# Patient Record
Sex: Female | Born: 1950
Health system: Southern US, Community
[De-identification: ages and names within clinical notes are randomized; demographics above are authoritative.]

## PROBLEM LIST (undated history)

## (undated) DIAGNOSIS — T4145XA Adverse effect of unspecified anesthetic, initial encounter: Secondary | ICD-10-CM

## (undated) DIAGNOSIS — Z973 Presence of spectacles and contact lenses: Secondary | ICD-10-CM

## (undated) DIAGNOSIS — M1990 Unspecified osteoarthritis, unspecified site: Secondary | ICD-10-CM

## (undated) DIAGNOSIS — H3530 Unspecified macular degeneration: Secondary | ICD-10-CM

## (undated) DIAGNOSIS — T8859XA Other complications of anesthesia, initial encounter: Secondary | ICD-10-CM

## (undated) DIAGNOSIS — K219 Gastro-esophageal reflux disease without esophagitis: Secondary | ICD-10-CM

## (undated) DIAGNOSIS — E039 Hypothyroidism, unspecified: Secondary | ICD-10-CM

## (undated) DIAGNOSIS — M4316 Spondylolisthesis, lumbar region: Secondary | ICD-10-CM

## (undated) DIAGNOSIS — E781 Pure hyperglyceridemia: Secondary | ICD-10-CM

## (undated) DIAGNOSIS — E7800 Pure hypercholesterolemia, unspecified: Secondary | ICD-10-CM

## (undated) DIAGNOSIS — Z8709 Personal history of other diseases of the respiratory system: Secondary | ICD-10-CM

## (undated) DIAGNOSIS — H9190 Unspecified hearing loss, unspecified ear: Secondary | ICD-10-CM

## (undated) HISTORY — PX: BACK SURGERY: SHX140

## (undated) HISTORY — PX: KNEE ARTHROPLASTY: SHX992

## (undated) HISTORY — PX: CATARACT EXTRACTION W/ INTRAOCULAR LENS IMPLANT: SHX1309

## (undated) HISTORY — PX: TONSILLECTOMY: SUR1361

## (undated) HISTORY — PX: JOINT REPLACEMENT: SHX530

## (undated) HISTORY — PX: ABDOMINAL HYSTERECTOMY: SHX81

## (undated) HISTORY — PX: COLONOSCOPY: SHX174

## (undated) HISTORY — PX: APPENDECTOMY: SHX54

## (undated) HISTORY — PX: ESOPHAGOGASTRODUODENOSCOPY: SHX1529

---

## 2015-03-17 DIAGNOSIS — R7989 Other specified abnormal findings of blood chemistry: Secondary | ICD-10-CM | POA: Diagnosis not present

## 2015-03-17 DIAGNOSIS — E039 Hypothyroidism, unspecified: Secondary | ICD-10-CM | POA: Diagnosis not present

## 2015-03-17 DIAGNOSIS — E28310 Symptomatic premature menopause: Secondary | ICD-10-CM | POA: Diagnosis not present

## 2015-04-01 DIAGNOSIS — Z96651 Presence of right artificial knee joint: Secondary | ICD-10-CM | POA: Diagnosis not present

## 2015-04-01 DIAGNOSIS — M25562 Pain in left knee: Secondary | ICD-10-CM | POA: Diagnosis not present

## 2015-04-01 DIAGNOSIS — M25462 Effusion, left knee: Secondary | ICD-10-CM | POA: Diagnosis not present

## 2015-04-29 DIAGNOSIS — Z471 Aftercare following joint replacement surgery: Secondary | ICD-10-CM | POA: Diagnosis not present

## 2015-04-29 DIAGNOSIS — Z96652 Presence of left artificial knee joint: Secondary | ICD-10-CM | POA: Diagnosis not present

## 2015-05-05 DIAGNOSIS — E039 Hypothyroidism, unspecified: Secondary | ICD-10-CM | POA: Diagnosis not present

## 2015-05-05 DIAGNOSIS — E782 Mixed hyperlipidemia: Secondary | ICD-10-CM | POA: Diagnosis not present

## 2015-05-05 DIAGNOSIS — K219 Gastro-esophageal reflux disease without esophagitis: Secondary | ICD-10-CM | POA: Diagnosis not present

## 2015-05-05 DIAGNOSIS — Z79899 Other long term (current) drug therapy: Secondary | ICD-10-CM | POA: Diagnosis not present

## 2015-05-05 DIAGNOSIS — Z01818 Encounter for other preprocedural examination: Secondary | ICD-10-CM | POA: Diagnosis not present

## 2015-05-18 ENCOUNTER — Other Ambulatory Visit: Payer: Self-pay | Admitting: Orthopedic Surgery

## 2015-05-27 DIAGNOSIS — K222 Esophageal obstruction: Secondary | ICD-10-CM | POA: Diagnosis not present

## 2015-05-27 DIAGNOSIS — K200 Eosinophilic esophagitis: Secondary | ICD-10-CM | POA: Diagnosis not present

## 2015-05-27 DIAGNOSIS — K219 Gastro-esophageal reflux disease without esophagitis: Secondary | ICD-10-CM | POA: Diagnosis not present

## 2015-05-28 DIAGNOSIS — H5203 Hypermetropia, bilateral: Secondary | ICD-10-CM | POA: Diagnosis not present

## 2015-05-28 DIAGNOSIS — H353131 Nonexudative age-related macular degeneration, bilateral, early dry stage: Secondary | ICD-10-CM | POA: Diagnosis not present

## 2015-06-02 NOTE — Pre-Procedure Instructions (Signed)
    Montgomery Bartoo  06/02/2015      [facility] - [place], [place] - [place] [address] Phone: [phone] Fax: [phone]    Your procedure is scheduled on Monday, April 17  Report to [facility] Admitting at 7:50 A.M.  Call this number if you have problems the morning of surgery:  [phone]              Any questions prior to surgery call [phone] Monday-Friday 8am-4pm   Remember:  Do not eat food or drink liquids after midnight on Sunday, April 16    Take these medicines the morning of surgery with A SIP OF WATER: levothyroxine(synthroid), omeprazole(prilosec), liothyronine (cytomel)  7 days prior to surgery, stop taking: Aspirin, NSAIDS, Aleve, Naproxen, Ibuprofen, Advil, Motrin, BC's, Goody's, Fish oil, all herbal medications, and all vitamins.    Do not wear jewelry, make-up or nail polish.  Do not wear lotions, powders, or perfumes.    Do not shave 48 hours prior to surgery.    Do not bring valuables to the hospital.   [facility] is not responsible for any belongings or valuables.  Contacts, dentures or bridgework may not be worn into surgery.  Leave your suitcase in the car.  After surgery it may be brought to your room.  For patients admitted to the hospital, discharge time will be determined by your treatment team.  Patients discharged the day of surgery will not be allowed to drive home.   Name and phone number of your driver:  Special instructions: review handout for preparing for surgery  Please read over the following fact sheets that you were given. Pain Booklet, Coughing and Deep Breathing, Blood Transfusion Information, MRSA Information and Surgical Site Infection Prevention

## 2015-06-03 ENCOUNTER — Ambulatory Visit (HOSPITAL_COMMUNITY)
Admission: RE | Admit: 2015-06-03 | Discharge: 2015-06-03 | Disposition: A | Discharge status: Home / Self Care | Payer: PPO | Source: Ambulatory Visit | Attending: Orthopedic Surgery | Admitting: Orthopedic Surgery

## 2015-06-03 ENCOUNTER — Encounter (HOSPITAL_COMMUNITY)
Admission: RE | Admit: 2015-06-03 | Discharge: 2015-06-03 | Disposition: A | Discharge status: Home / Self Care | Payer: PPO | Source: Ambulatory Visit | Attending: Orthopedic Surgery | Admitting: Orthopedic Surgery

## 2015-06-03 ENCOUNTER — Encounter (HOSPITAL_COMMUNITY): Payer: Self-pay

## 2015-06-03 DIAGNOSIS — Z01818 Encounter for other preprocedural examination: Secondary | ICD-10-CM

## 2015-06-03 DIAGNOSIS — Z01812 Encounter for preprocedural laboratory examination: Secondary | ICD-10-CM | POA: Insufficient documentation

## 2015-06-03 DIAGNOSIS — Z0183 Encounter for blood typing: Secondary | ICD-10-CM | POA: Insufficient documentation

## 2015-06-03 DIAGNOSIS — I4510 Unspecified right bundle-branch block: Secondary | ICD-10-CM | POA: Diagnosis not present

## 2015-06-03 HISTORY — DX: Adverse effect of unspecified anesthetic, initial encounter: T41.45XA

## 2015-06-03 HISTORY — DX: Personal history of other diseases of the respiratory system: Z87.09

## 2015-06-03 HISTORY — DX: Unspecified hearing loss, unspecified ear: H91.90

## 2015-06-03 HISTORY — DX: Pure hyperglyceridemia: E78.1

## 2015-06-03 HISTORY — DX: Other complications of anesthesia, initial encounter: T88.59XA

## 2015-06-03 HISTORY — DX: Pure hypercholesterolemia, unspecified: E78.00

## 2015-06-03 HISTORY — DX: Gastro-esophageal reflux disease without esophagitis: K21.9

## 2015-06-03 HISTORY — DX: Unspecified osteoarthritis, unspecified site: M19.90

## 2015-06-03 HISTORY — DX: Unspecified macular degeneration: H35.30

## 2015-06-03 HISTORY — DX: Hypothyroidism, unspecified: E03.9

## 2015-06-03 LAB — CBC WITH DIFFERENTIAL/PLATELET
Basophils Absolute: 0.1 10*3/uL (ref 0.0–0.1)
Basophils Relative: 1 %
EOS PCT: 1 %
Eosinophils Absolute: 0.1 10*3/uL (ref 0.0–0.7)
HEMATOCRIT: 47.7 % — AB (ref 36.0–46.0)
Hemoglobin: 17.2 g/dL — ABNORMAL HIGH (ref 12.0–15.0)
LYMPHS ABS: 2.5 10*3/uL (ref 0.7–4.0)
LYMPHS PCT: 24 %
MCH: 31.4 pg (ref 26.0–34.0)
MCHC: 36.1 g/dL — ABNORMAL HIGH (ref 30.0–36.0)
MCV: 87 fL (ref 78.0–100.0)
MONO ABS: 0.6 10*3/uL (ref 0.1–1.0)
Monocytes Relative: 6 %
NEUTROS ABS: 7.3 10*3/uL (ref 1.7–7.7)
Neutrophils Relative %: 68 %
PLATELETS: 224 10*3/uL (ref 150–400)
RBC: 5.48 MIL/uL — AB (ref 3.87–5.11)
RDW: 12.5 % (ref 11.5–15.5)
WBC: 10.6 10*3/uL — ABNORMAL HIGH (ref 4.0–10.5)

## 2015-06-03 LAB — COMPREHENSIVE METABOLIC PANEL
ALT: 15 U/L (ref 14–54)
AST: 20 U/L (ref 15–41)
Albumin: 3.8 g/dL (ref 3.5–5.0)
Alkaline Phosphatase: 48 U/L (ref 38–126)
Anion gap: 11 (ref 5–15)
BILIRUBIN TOTAL: 0.8 mg/dL (ref 0.3–1.2)
BUN: 16 mg/dL (ref 6–20)
CHLORIDE: 108 mmol/L (ref 101–111)
CO2: 22 mmol/L (ref 22–32)
CREATININE: 0.67 mg/dL (ref 0.44–1.00)
Calcium: 9.4 mg/dL (ref 8.9–10.3)
GFR calc non Af Amer: >60 mL/min (ref >60)
Glucose, Bld: 83 mg/dL (ref 65–99)
Potassium: 3.5 mmol/L (ref 3.5–5.1)
Sodium: 141 mmol/L (ref 135–145)
TOTAL PROTEIN: 7.2 g/dL (ref 6.5–8.1)

## 2015-06-03 LAB — URINALYSIS, ROUTINE W REFLEX MICROSCOPIC
Bilirubin Urine: NEGATIVE
GLUCOSE, UA: NEGATIVE mg/dL
Hgb urine dipstick: NEGATIVE
Ketones, ur: NEGATIVE mg/dL
LEUKOCYTES UA: NEGATIVE
NITRITE: NEGATIVE
PH: 5.5 (ref 5.0–8.0)
Protein, ur: NEGATIVE mg/dL
Specific Gravity, Urine: 1.009 (ref 1.005–1.030)

## 2015-06-03 LAB — TYPE AND SCREEN
ABO/RH(D): A POS
Antibody Screen: NEGATIVE

## 2015-06-03 LAB — SURGICAL PCR SCREEN
MRSA, PCR: NEGATIVE
STAPHYLOCOCCUS AUREUS: NEGATIVE

## 2015-06-03 LAB — PROTIME-INR
INR: 1.03 (ref 0.00–1.49)
PROTHROMBIN TIME: 13.7 s (ref 11.6–15.2)

## 2015-06-03 LAB — ABO/RH: ABO/RH(D): A POS

## 2015-06-03 LAB — APTT: aPTT: 31 seconds (ref 24–37)

## 2015-06-03 NOTE — Progress Notes (Signed)
PCP - Dr. Gilford Rile Cardiologist - denies  EKG - 06/03/15 - also requested previous EKG from [place] - 06/03/15 Echo/Stress Test - requested from [place] denies  Patient denies chest pain and shortness of breath at PAT appointment.

## 2015-06-04 LAB — URINE CULTURE: Culture: 1000 — AB

## 2015-06-04 NOTE — Progress Notes (Signed)
Anesthesia Chart Review:  Pt is a 65 year old female scheduled for L total knee revision on 06/14/2015 with Dr. Ronnie Derby.   PMH includes:  Hyperlipidemia, hypothyroidism, GERD. Hard of hearing. Former smoker. BMI 24  Medications include: ASA, levothyroxine, liothyronine, prilosec  Preoperative labs reviewed.    Chest x-ray 06/03/15 reviewed. No edema or consolidation   EKG 06/03/15: NSR. RBBB  Nuclear stress test 07/03/14:  1. No reversible ischemia or infarction 2. Normal LV wall motion 3. EF 58% 4. Low risk stress test findings  CTA 07/03/14:  LM and LAD CAD  Stress echo 11/23/13: No evidence of inducible ischemia to achieved workload  If no changes, I anticipate pt can proceed with surgery as scheduled.   Willeen Cass, FNP-BC [facility]/Anesthesiology Phone: [phone] 06/04/2015 12:31 PM

## 2015-06-11 MED ORDER — BUPIVACAINE LIPOSOME 1.3 % IJ SUSP
20.0000 mL | INTRAMUSCULAR | Status: AC
  Administered 2015-06-14: 20 mL
  Filled 2015-06-11: qty 20

## 2015-06-11 MED ORDER — SODIUM CHLORIDE 0.9 % IV SOLN: INTRAVENOUS | Status: DC

## 2015-06-11 MED ORDER — TRANEXAMIC ACID 1000 MG/10ML IV SOLN
1000.0000 mg | INTRAVENOUS | Status: AC
  Administered 2015-06-14: 1000 mg via INTRAVENOUS
  Filled 2015-06-11: qty 10

## 2015-06-11 MED ORDER — ACETAMINOPHEN 500 MG PO TABS
1000.0000 mg | ORAL_TABLET | Freq: Once | ORAL | Status: AC
  Administered 2015-06-14: 1000 mg via ORAL
  Filled 2015-06-11: qty 2

## 2015-06-14 ENCOUNTER — Inpatient Hospital Stay (HOSPITAL_COMMUNITY): Payer: PPO | Admitting: Emergency Medicine

## 2015-06-14 ENCOUNTER — Inpatient Hospital Stay (HOSPITAL_COMMUNITY): Payer: PPO | Admitting: Certified Registered Nurse Anesthetist

## 2015-06-14 ENCOUNTER — Inpatient Hospital Stay (HOSPITAL_COMMUNITY)
Admission: RE | Admit: 2015-06-14 | Discharge: 2015-06-15 | DRG: 489 | Disposition: A | Discharge status: Home Health | Payer: PPO | Source: Ambulatory Visit | Attending: Orthopedic Surgery | Admitting: Orthopedic Surgery

## 2015-06-14 ENCOUNTER — Encounter (HOSPITAL_COMMUNITY)
Admission: RE | Disposition: A | Discharge status: Home Health | Payer: Self-pay | Source: Ambulatory Visit | Attending: Orthopedic Surgery

## 2015-06-14 ENCOUNTER — Encounter (HOSPITAL_COMMUNITY): Payer: Self-pay | Admitting: *Deleted

## 2015-06-14 DIAGNOSIS — E039 Hypothyroidism, unspecified: Secondary | ICD-10-CM | POA: Diagnosis not present

## 2015-06-14 DIAGNOSIS — E781 Pure hyperglyceridemia: Secondary | ICD-10-CM | POA: Diagnosis not present

## 2015-06-14 DIAGNOSIS — Z96659 Presence of unspecified artificial knee joint: Secondary | ICD-10-CM

## 2015-06-14 DIAGNOSIS — T84093A Other mechanical complication of internal left knee prosthesis, initial encounter: Secondary | ICD-10-CM | POA: Diagnosis not present

## 2015-06-14 DIAGNOSIS — T84023A Instability of internal left knee prosthesis, initial encounter: Secondary | ICD-10-CM | POA: Diagnosis not present

## 2015-06-14 DIAGNOSIS — Y792 Prosthetic and other implants, materials and accessory orthopedic devices associated with adverse incidents: Secondary | ICD-10-CM | POA: Diagnosis present

## 2015-06-14 DIAGNOSIS — Z96652 Presence of left artificial knee joint: Secondary | ICD-10-CM | POA: Diagnosis not present

## 2015-06-14 DIAGNOSIS — K219 Gastro-esophageal reflux disease without esophagitis: Secondary | ICD-10-CM | POA: Diagnosis present

## 2015-06-14 DIAGNOSIS — E7800 Pure hypercholesterolemia, unspecified: Secondary | ICD-10-CM | POA: Diagnosis not present

## 2015-06-14 DIAGNOSIS — T8484XA Pain due to internal orthopedic prosthetic devices, implants and grafts, initial encounter: Secondary | ICD-10-CM | POA: Diagnosis not present

## 2015-06-14 DIAGNOSIS — H9190 Unspecified hearing loss, unspecified ear: Secondary | ICD-10-CM | POA: Diagnosis present

## 2015-06-14 DIAGNOSIS — Z87891 Personal history of nicotine dependence: Secondary | ICD-10-CM | POA: Diagnosis not present

## 2015-06-14 DIAGNOSIS — G8918 Other acute postprocedural pain: Secondary | ICD-10-CM | POA: Diagnosis not present

## 2015-06-14 HISTORY — PX: TOTAL KNEE REVISION: SHX996

## 2015-06-14 LAB — CBC
HCT: 42.6 % (ref 36.0–46.0)
Hemoglobin: 14.4 g/dL (ref 12.0–15.0)
MCH: 29.4 pg (ref 26.0–34.0)
MCHC: 33.8 g/dL (ref 30.0–36.0)
MCV: 86.9 fL (ref 78.0–100.0)
PLATELETS: 188 10*3/uL (ref 150–400)
RBC: 4.9 MIL/uL (ref 3.87–5.11)
RDW: 12.5 % (ref 11.5–15.5)
WBC: 10.7 10*3/uL — AB (ref 4.0–10.5)

## 2015-06-14 LAB — CREATININE, SERUM: CREATININE: 0.65 mg/dL (ref 0.44–1.00)

## 2015-06-14 SURGERY — TOTAL KNEE REVISION
Anesthesia: Spinal | Site: Knee | Laterality: Left

## 2015-06-14 MED ORDER — ENOXAPARIN SODIUM 30 MG/0.3ML ~~LOC~~ SOLN
30.0000 mg | Freq: Two times a day (BID) | SUBCUTANEOUS | Status: DC
  Administered 2015-06-15: 30 mg via SUBCUTANEOUS
  Filled 2015-06-14: qty 0.3

## 2015-06-14 MED ORDER — PROPOFOL 10 MG/ML IV BOLUS
INTRAVENOUS | Status: DC | PRN
  Administered 2015-06-14: 20 mg via INTRAVENOUS

## 2015-06-14 MED ORDER — ACETAMINOPHEN 325 MG PO TABS: 650.0000 mg | ORAL_TABLET | Freq: Four times a day (QID) | ORAL | Status: DC | PRN

## 2015-06-14 MED ORDER — PANTOPRAZOLE SODIUM 40 MG PO TBEC
40.0000 mg | DELAYED_RELEASE_TABLET | Freq: Every day | ORAL | Status: DC
  Administered 2015-06-15: 40 mg via ORAL
  Filled 2015-06-14: qty 1

## 2015-06-14 MED ORDER — MIDAZOLAM HCL 2 MG/2ML IJ SOLN
INTRAMUSCULAR | Status: AC
  Administered 2015-06-14: 1 mg via INTRAVENOUS
  Administered 2015-06-14: 2 mg via INTRAVENOUS
  Administered 2015-06-14: 1 mg via INTRAVENOUS
  Filled 2015-06-14: qty 2

## 2015-06-14 MED ORDER — MENTHOL 3 MG MT LOZG: 1.0000 | LOZENGE | OROMUCOSAL | Status: DC | PRN

## 2015-06-14 MED ORDER — FENTANYL CITRATE (PF) 100 MCG/2ML IJ SOLN
INTRAMUSCULAR | Status: AC
  Administered 2015-06-14 (×2): 50 ug via INTRAVENOUS
  Filled 2015-06-14: qty 2

## 2015-06-14 MED ORDER — ONDANSETRON HCL 4 MG/2ML IJ SOLN: 4.0000 mg | Freq: Once | INTRAMUSCULAR | Status: DC | PRN

## 2015-06-14 MED ORDER — SODIUM CHLORIDE 0.9 % IV SOLN: INTRAVENOUS | Status: DC

## 2015-06-14 MED ORDER — BUPIVACAINE IN DEXTROSE 0.75-8.25 % IT SOLN
INTRATHECAL | Status: DC | PRN
  Administered 2015-06-14: 2 mL via INTRATHECAL

## 2015-06-14 MED ORDER — ACETAMINOPHEN 650 MG RE SUPP: 650.0000 mg | Freq: Four times a day (QID) | RECTAL | Status: DC | PRN

## 2015-06-14 MED ORDER — ZOLPIDEM TARTRATE 5 MG PO TABS: 5.0000 mg | ORAL_TABLET | Freq: Every evening | ORAL | Status: DC | PRN

## 2015-06-14 MED ORDER — BISACODYL 5 MG PO TBEC: 5.0000 mg | DELAYED_RELEASE_TABLET | Freq: Every day | ORAL | Status: DC | PRN

## 2015-06-14 MED ORDER — ONDANSETRON HCL 4 MG PO TABS
4.0000 mg | ORAL_TABLET | Freq: Four times a day (QID) | ORAL | Status: DC | PRN
  Administered 2015-06-15: 4 mg via ORAL
  Filled 2015-06-14: qty 1

## 2015-06-14 MED ORDER — CHLORHEXIDINE GLUCONATE 4 % EX LIQD: 60.0000 mL | Freq: Once | CUTANEOUS | Status: DC

## 2015-06-14 MED ORDER — BUPIVACAINE-EPINEPHRINE (PF) 0.25% -1:200000 IJ SOLN
INTRAMUSCULAR | Status: AC
  Filled 2015-06-14: qty 30

## 2015-06-14 MED ORDER — CLINDAMYCIN PHOSPHATE 600 MG/50ML IV SOLN
600.0000 mg | Freq: Four times a day (QID) | INTRAVENOUS | Status: AC
  Administered 2015-06-14 (×2): 600 mg via INTRAVENOUS
  Filled 2015-06-14 (×2): qty 50

## 2015-06-14 MED ORDER — METHOCARBAMOL 500 MG PO TABS
ORAL_TABLET | ORAL | Status: AC
  Filled 2015-06-14: qty 1

## 2015-06-14 MED ORDER — DEXTROSE 5 % IV SOLN
2.0000 g | INTRAVENOUS | Status: DC
  Filled 2015-06-14 (×2): qty 20

## 2015-06-14 MED ORDER — FENTANYL CITRATE (PF) 250 MCG/5ML IJ SOLN
INTRAMUSCULAR | Status: AC
  Filled 2015-06-14: qty 5

## 2015-06-14 MED ORDER — OXYCODONE HCL 5 MG PO TABS
5.0000 mg | ORAL_TABLET | ORAL | Status: DC | PRN
  Administered 2015-06-14: 10 mg via ORAL

## 2015-06-14 MED ORDER — BUPIVACAINE-EPINEPHRINE 0.25% -1:200000 IJ SOLN
INTRAMUSCULAR | Status: DC | PRN
  Administered 2015-06-14: 30 mg

## 2015-06-14 MED ORDER — MEPERIDINE HCL 25 MG/ML IJ SOLN: 6.2500 mg | INTRAMUSCULAR | Status: DC | PRN

## 2015-06-14 MED ORDER — PHENYLEPHRINE 40 MCG/ML (10ML) SYRINGE FOR IV PUSH (FOR BLOOD PRESSURE SUPPORT)
PREFILLED_SYRINGE | INTRAVENOUS | Status: AC
  Filled 2015-06-14: qty 10

## 2015-06-14 MED ORDER — METHOCARBAMOL 500 MG PO TABS
500.0000 mg | ORAL_TABLET | Freq: Four times a day (QID) | ORAL | Status: DC | PRN
  Administered 2015-06-14: 500 mg via ORAL

## 2015-06-14 MED ORDER — METOCLOPRAMIDE HCL 5 MG PO TABS: 5.0000 mg | ORAL_TABLET | Freq: Three times a day (TID) | ORAL | Status: DC | PRN

## 2015-06-14 MED ORDER — LIDOCAINE HCL (CARDIAC) 20 MG/ML IV SOLN
INTRAVENOUS | Status: DC | PRN
  Administered 2015-06-14: 60 mg via INTRAVENOUS

## 2015-06-14 MED ORDER — ONDANSETRON HCL 4 MG/2ML IJ SOLN: 4.0000 mg | Freq: Four times a day (QID) | INTRAMUSCULAR | Status: DC | PRN

## 2015-06-14 MED ORDER — HYDROMORPHONE HCL 1 MG/ML IJ SOLN: 0.2500 mg | INTRAMUSCULAR | Status: DC | PRN

## 2015-06-14 MED ORDER — ALUM & MAG HYDROXIDE-SIMETH 200-200-20 MG/5ML PO SUSP: 30.0000 mL | ORAL | Status: DC | PRN

## 2015-06-14 MED ORDER — OXYCODONE HCL 5 MG PO TABS
ORAL_TABLET | ORAL | Status: AC
  Filled 2015-06-14: qty 2

## 2015-06-14 MED ORDER — FLEET ENEMA 7-19 GM/118ML RE ENEM: 1.0000 | ENEMA | Freq: Once | RECTAL | Status: DC | PRN

## 2015-06-14 MED ORDER — METHOCARBAMOL 1000 MG/10ML IJ SOLN
500.0000 mg | Freq: Four times a day (QID) | INTRAVENOUS | Status: DC | PRN
  Filled 2015-06-14: qty 5

## 2015-06-14 MED ORDER — LIDOCAINE HCL (CARDIAC) 20 MG/ML IV SOLN
INTRAVENOUS | Status: AC
  Filled 2015-06-14: qty 5

## 2015-06-14 MED ORDER — DIPHENHYDRAMINE HCL 12.5 MG/5ML PO ELIX: 12.5000 mg | ORAL_SOLUTION | ORAL | Status: DC | PRN

## 2015-06-14 MED ORDER — BUPIVACAINE-EPINEPHRINE (PF) 0.5% -1:200000 IJ SOLN
INTRAMUSCULAR | Status: DC | PRN
  Administered 2015-06-14: 30 mL via PERINEURAL

## 2015-06-14 MED ORDER — PHENOL 1.4 % MT LIQD: 1.0000 | OROMUCOSAL | Status: DC | PRN

## 2015-06-14 MED ORDER — CEFAZOLIN SODIUM-DEXTROSE 2-4 GM/100ML-% IV SOLN
INTRAVENOUS | Status: AC
  Administered 2015-06-14: 2 g via INTRAVENOUS
  Filled 2015-06-14: qty 100

## 2015-06-14 MED ORDER — PHENYLEPHRINE 40 MCG/ML (10ML) SYRINGE FOR IV PUSH (FOR BLOOD PRESSURE SUPPORT)
PREFILLED_SYRINGE | INTRAVENOUS | Status: AC
Start: 2015-06-14 — End: 2015-06-14
  Filled 2015-06-14: qty 20

## 2015-06-14 MED ORDER — LIOTHYRONINE SODIUM 25 MCG PO TABS
25.0000 ug | ORAL_TABLET | Freq: Every day | ORAL | Status: DC
  Administered 2015-06-15: 25 ug via ORAL
  Filled 2015-06-14: qty 1

## 2015-06-14 MED ORDER — CELECOXIB 200 MG PO CAPS
200.0000 mg | ORAL_CAPSULE | Freq: Two times a day (BID) | ORAL | Status: DC
  Administered 2015-06-14 – 2015-06-15 (×3): 200 mg via ORAL
  Filled 2015-06-14 (×3): qty 1

## 2015-06-14 MED ORDER — MIDAZOLAM HCL 2 MG/2ML IJ SOLN
INTRAMUSCULAR | Status: AC
  Filled 2015-06-14: qty 2

## 2015-06-14 MED ORDER — PROPOFOL 500 MG/50ML IV EMUL
INTRAVENOUS | Status: DC | PRN
  Administered 2015-06-14: 50 ug/kg/min via INTRAVENOUS

## 2015-06-14 MED ORDER — BUPIVACAINE-EPINEPHRINE 0.5% -1:200000 IJ SOLN: INTRAMUSCULAR | Status: DC | PRN

## 2015-06-14 MED ORDER — HYDROMORPHONE HCL 1 MG/ML IJ SOLN
0.5000 mg | INTRAMUSCULAR | Status: DC | PRN
  Administered 2015-06-14: 0.5 mg via INTRAVENOUS
  Filled 2015-06-14: qty 1

## 2015-06-14 MED ORDER — TOBRAMYCIN SULFATE 1.2 G IJ SOLR
INTRAMUSCULAR | Status: AC
  Filled 2015-06-14: qty 1.2

## 2015-06-14 MED ORDER — OXYCODONE HCL ER 10 MG PO T12A
10.0000 mg | EXTENDED_RELEASE_TABLET | Freq: Two times a day (BID) | ORAL | Status: DC
  Administered 2015-06-14 – 2015-06-15 (×3): 10 mg via ORAL
  Filled 2015-06-14 (×3): qty 1

## 2015-06-14 MED ORDER — TRANEXAMIC ACID 1000 MG/10ML IV SOLN
1000.0000 mg | Freq: Once | INTRAVENOUS | Status: AC
  Administered 2015-06-14: 1000 mg via INTRAVENOUS
  Filled 2015-06-14: qty 10

## 2015-06-14 MED ORDER — LACTATED RINGERS IV SOLN
INTRAVENOUS | Status: DC
  Administered 2015-06-14 (×4): via INTRAVENOUS

## 2015-06-14 MED ORDER — DOCUSATE SODIUM 100 MG PO CAPS
100.0000 mg | ORAL_CAPSULE | Freq: Two times a day (BID) | ORAL | Status: DC
  Administered 2015-06-14 – 2015-06-15 (×2): 100 mg via ORAL
  Filled 2015-06-14 (×2): qty 1

## 2015-06-14 MED ORDER — SODIUM CHLORIDE 0.9 % IR SOLN
Status: DC | PRN
  Administered 2015-06-14: 1000 mL

## 2015-06-14 MED ORDER — PHENYLEPHRINE HCL 10 MG/ML IJ SOLN
INTRAMUSCULAR | Status: DC | PRN
  Administered 2015-06-14: 40 ug via INTRAVENOUS
  Administered 2015-06-14 (×2): 80 ug via INTRAVENOUS
  Administered 2015-06-14: 40 ug via INTRAVENOUS
  Administered 2015-06-14 (×2): 80 ug via INTRAVENOUS

## 2015-06-14 MED ORDER — LEVOTHYROXINE SODIUM 88 MCG PO TABS
88.0000 ug | ORAL_TABLET | Freq: Every day | ORAL | Status: DC
  Administered 2015-06-15: 88 ug via ORAL
  Filled 2015-06-14: qty 1

## 2015-06-14 MED ORDER — METOCLOPRAMIDE HCL 5 MG/ML IJ SOLN: 5.0000 mg | Freq: Three times a day (TID) | INTRAMUSCULAR | Status: DC | PRN

## 2015-06-14 MED ORDER — SODIUM CHLORIDE 0.9 % IJ SOLN
INTRAMUSCULAR | Status: DC | PRN
  Administered 2015-06-14: 20 mL via INTRAVENOUS

## 2015-06-14 MED ORDER — CEFAZOLIN SODIUM 1 G IJ SOLR
2.0000 g | INTRAMUSCULAR | Status: DC
  Filled 2015-06-14: qty 20

## 2015-06-14 MED ORDER — SENNOSIDES-DOCUSATE SODIUM 8.6-50 MG PO TABS: 1.0000 | ORAL_TABLET | Freq: Every evening | ORAL | Status: DC | PRN

## 2015-06-14 SURGICAL SUPPLY — 72 items
BANDAGE ESMARK 6X9 LF (GAUZE/BANDAGES/DRESSINGS) ×1 IMPLANT
BLADE SAGITTAL 13X1.27X60 (BLADE) IMPLANT
BLADE SAGITTAL 13X1.27X60MM (BLADE)
BLADE SAW SGTL 13X75X1.27 (BLADE) ×3 IMPLANT
BLADE SAW SGTL 83.5X18.5 (BLADE) ×3 IMPLANT
BLADE SAW SGTL NAR THIN XSHT (BLADE) IMPLANT
BLADE SURG 10 STRL SS (BLADE) ×9 IMPLANT
BNDG ESMARK 6X9 LF (GAUZE/BANDAGES/DRESSINGS) ×3
BOWL SMART MIX CTS (DISPOSABLE) IMPLANT
COVER SURGICAL LIGHT HANDLE (MISCELLANEOUS) ×3 IMPLANT
CUFF TOURNIQUET SINGLE 34IN LL (TOURNIQUET CUFF) ×3 IMPLANT
DRAPE EXTREMITY T 121X128X90 (DRAPE) ×3 IMPLANT
DRAPE IMP U-DRAPE 54X76 (DRAPES) ×3 IMPLANT
DRAPE INCISE IOBAN 66X45 STRL (DRAPES) ×6 IMPLANT
DRAPE PROXIMA HALF (DRAPES) ×3 IMPLANT
DRAPE U-SHAPE 47X51 STRL (DRAPES) ×3 IMPLANT
DRSG ADAPTIC 3X8 NADH LF (GAUZE/BANDAGES/DRESSINGS) ×3 IMPLANT
DRSG PAD ABDOMINAL 8X10 ST (GAUZE/BANDAGES/DRESSINGS) ×3 IMPLANT
DURAPREP 26ML APPLICATOR (WOUND CARE) ×6 IMPLANT
ELECT REM PT RETURN 9FT ADLT (ELECTROSURGICAL) ×3
ELECTRODE REM PT RTRN 9FT ADLT (ELECTROSURGICAL) ×1 IMPLANT
EVACUATOR 1/8 PVC DRAIN (DRAIN) ×3 IMPLANT
FACESHIELD WRAPAROUND (MASK) ×3 IMPLANT
GAUZE SPONGE 4X4 12PLY STRL (GAUZE/BANDAGES/DRESSINGS) ×3 IMPLANT
GLOVE BIOGEL M 7.0 STRL (GLOVE) ×3 IMPLANT
GLOVE BIOGEL PI IND STRL 7.5 (GLOVE) IMPLANT
GLOVE BIOGEL PI IND STRL 8.5 (GLOVE) ×4 IMPLANT
GLOVE BIOGEL PI INDICATOR 7.5 (GLOVE)
GLOVE BIOGEL PI INDICATOR 8.5 (GLOVE) ×8
GLOVE BIOGEL PI ORTHO PRO SZ8 (GLOVE) ×2
GLOVE PI ORTHO PRO STRL SZ8 (GLOVE) ×1 IMPLANT
GLOVE SURG ORTHO 8.0 STRL STRW (GLOVE) ×18 IMPLANT
GOWN STRL REUS W/ TWL LRG LVL3 (GOWN DISPOSABLE) ×2 IMPLANT
GOWN STRL REUS W/ TWL XL LVL3 (GOWN DISPOSABLE) ×2 IMPLANT
GOWN STRL REUS W/TWL 2XL LVL3 (GOWN DISPOSABLE) ×3 IMPLANT
GOWN STRL REUS W/TWL LRG LVL3 (GOWN DISPOSABLE) ×4
GOWN STRL REUS W/TWL XL LVL3 (GOWN DISPOSABLE) ×4
HANDPIECE INTERPULSE COAX TIP (DISPOSABLE)
HOOD PEEL AWAY FACE SHEILD DIS (HOOD) ×12 IMPLANT
INSERT TIB 3 11XCNDRL STAB BRN (Insert) ×1 IMPLANT
KIT BASIN OR (CUSTOM PROCEDURE TRAY) ×3 IMPLANT
KIT ROOM TURNOVER OR (KITS) ×3 IMPLANT
KNEE INSERT TIB BRG (Insert) ×2 IMPLANT
MANIFOLD NEPTUNE II (INSTRUMENTS) ×3 IMPLANT
NEEDLE 18GX1X1/2 (RX/OR ONLY) (NEEDLE) IMPLANT
NEEDLE 22X1 1/2 (OR ONLY) (NEEDLE) ×6 IMPLANT
NS IRRIG 1000ML POUR BTL (IV SOLUTION) ×3 IMPLANT
PACK TOTAL JOINT (CUSTOM PROCEDURE TRAY) ×3 IMPLANT
PACK UNIVERSAL I (CUSTOM PROCEDURE TRAY) IMPLANT
PAD ARMBOARD 7.5X6 YLW CONV (MISCELLANEOUS) ×6 IMPLANT
PADDING CAST COTTON 6X4 STRL (CAST SUPPLIES) ×3 IMPLANT
SET HNDPC FAN SPRY TIP SCT (DISPOSABLE) IMPLANT
STAPLER VISISTAT 35W (STAPLE) ×3 IMPLANT
SUCTION FRAZIER HANDLE 10FR (MISCELLANEOUS) ×2
SUCTION TUBE FRAZIER 10FR DISP (MISCELLANEOUS) ×1 IMPLANT
SUT BONE WAX W31G (SUTURE) ×3 IMPLANT
SUT PDS AB 0 CT 36 (SUTURE) IMPLANT
SUT PDS AB 1 CT  36 (SUTURE)
SUT PDS AB 1 CT 36 (SUTURE) IMPLANT
SUT PDS AB 2-0 CT1 27 (SUTURE) IMPLANT
SUT VIC AB 0 CTB1 27 (SUTURE) ×3 IMPLANT
SUT VIC AB 1 CT1 27 (SUTURE) ×2
SUT VIC AB 1 CT1 27XBRD ANBCTR (SUTURE) ×1 IMPLANT
SUT VIC AB 2-0 CTB1 (SUTURE) ×6 IMPLANT
SWAB COLLECTION DEVICE MRSA (MISCELLANEOUS) IMPLANT
SYR 20CC LL (SYRINGE) ×6 IMPLANT
SYR CONTROL 10ML LL (SYRINGE) ×6 IMPLANT
TOWEL OR 17X24 6PK STRL BLUE (TOWEL DISPOSABLE) ×3 IMPLANT
TOWEL OR 17X26 10 PK STRL BLUE (TOWEL DISPOSABLE) ×3 IMPLANT
TRAY FOLEY CATH 16FRSI W/METER (SET/KITS/TRAYS/PACK) ×3 IMPLANT
TUBE ANAEROBIC SPECIMEN COL (MISCELLANEOUS) IMPLANT
WATER STERILE IRR 1000ML POUR (IV SOLUTION) IMPLANT

## 2015-06-14 NOTE — H&P (Signed)
Molly Ferrell MRN:  [id number] DOB/SEX:  [birth year]/female  CHIEF COMPLAINT:  Painful left Knee  HISTORY: Patient is a 65 y.o. female presented with a history of pain in the left knee. Onset of symptoms was gradual starting a few years ago with gradually worsening course since that time. Prior procedures on the knee include tka. Patient has been treated conservatively with over-the-counter NSAIDs and activity modification. Patient currently rates pain in the knee at 10 out of 10 with activity. There is pain at night.  PAST MEDICAL HISTORY: There are no active problems to display for this patient.  Past Medical History  Diagnosis Date  . Complication of anesthesia     "many years ago, had problems waking up"  . GERD (gastroesophageal reflux disease)   . Hypercholesteremia   . Hypertriglyceridemia   . Hypothyroidism   . Macular degeneration of both eyes   . History of bronchitis   . Arthritis   . Hard of hearing    Past Surgical History  Procedure Laterality Date  . Abdominal hysterectomy    . Knee arthroplasty Left   . Tonsillectomy    . Colonoscopy    . Esophagogastroduodenoscopy       MEDICATIONS:   No prescriptions prior to admission    ALLERGIES:   Allergies  Allergen Reactions  . Penicillins Hives and Itching    CHILDHOOD Reaction to Pen G    REVIEW OF SYSTEMS:  A comprehensive review of systems was negative except for: Musculoskeletal: positive for arthralgias and stiff joints   FAMILY HISTORY:  No family history on file.  SOCIAL HISTORY:   Social History  Substance Use Topics  . Smoking status: Former Research scientist (life sciences)  . Smokeless tobacco: Never Used     Comment: "quit 10-15 years ago" 06/03/15  . Alcohol Use: 0.6 oz/week    1 Glasses of wine per week     Comment: weekends     EXAMINATION:  Vital signs in last 24 hours:    There were no vitals taken for this visit.  General Appearance:    Alert, cooperative, no distress, appears stated age  Head:     Normocephalic, without obvious abnormality, atraumatic  Eyes:    PERRL, conjunctiva/corneas clear, EOM's intact, fundi    benign, both eyes  Ears:    Normal TM's and external ear canals, both ears  Nose:   Nares normal, septum midline, mucosa normal, no drainage    or sinus tenderness  Throat:   Lips, mucosa, and tongue normal; teeth and gums normal  Neck:   Supple, symmetrical, trachea midline, no adenopathy;    thyroid:  no enlargement/tenderness/nodules; no carotid   bruit or JVD  Back:     Symmetric, no curvature, ROM normal, no CVA tenderness  Lungs:     Clear to auscultation bilaterally, respirations unlabored  Chest Wall:    No tenderness or deformity   Heart:    Regular rate and rhythm, S1 and S2 normal, no murmur, rub   or gallop  Breast Exam:    No tenderness, masses, or nipple abnormality  Abdomen:     Soft, non-tender, bowel sounds active all four quadrants,    no masses, no organomegaly  Genitalia:    Normal female without lesion, discharge or tenderness  Rectal:    Normal tone, normal prostate, no masses or tenderness;   guaiac negative stool  Extremities:   Extremities normal, atraumatic, no cyanosis or edema  Pulses:   2+ and symmetric all extremities  Skin:  Skin color, texture, turgor normal, no rashes or lesions  Lymph nodes:   Cervical, supraclavicular, and axillary nodes normal  Neurologic:   CNII-XII intact, normal strength, sensation and reflexes    throughout    Musculoskeletal:  ROM 0-90, Ligaments intact,  Imaging Review Plain radiographs demonstrate failed tka with degenerative joint disease of the left knee. The overall alignment is neutral. The bone quality appears to be good for age and reported activity level.  Assessment/Plan: Primary osteoarthritis, left knee, with failed tka  The patient history, physical examination and imaging studies are consistent with advanced degenerative joint disease of the left knee. The patient has failed conservative  treatment.  The clearance notes were reviewed.  After discussion with the patient it was felt that Total Knee Replacement was indicated. The procedure,  risks, and benefits of total knee arthroplasty were presented and reviewed. The risks including but not limited to aseptic loosening, infection, blood clots, vascular injury, stiffness, patella tracking problems complications among others were discussed. The patient acknowledged the explanation, agreed to proceed with the plan.  Donia Ast 06/14/2015, 6:36 AM

## 2015-06-14 NOTE — Evaluation (Signed)
Physical Therapy Evaluation Patient Details Name: Molly Ferrell MRN: [id number] DOB: 1950/07/26 Today's Date: 06/14/2015   History of Present Illness  Pt is a 65 y/o F s/p revision Lt TKA.  Pt's PMH includes macular degeneration both eyes, HOH.  Clinical Impression  Pt is s/p Lt TKA resulting in the deficits listed below (see PT Problem List). Evaluation limited to bed due to residual effects of nerve block.  Pt performed therapeutic exercises sitting EOB and supine in bed.  Anticipate that she will progress well once nerve block has worn off.  Her husband is retired and will be available for 24/7 supervision at d/c. Pt will benefit from skilled PT to increase their independence and safety with mobility to allow discharge to the venue listed below.      Follow Up Recommendations Home health PT;Supervision for mobility/OOB    Equipment Recommendations  None recommended by PT    Recommendations for Other Services OT consult     Precautions / Restrictions Precautions Precautions: Fall;Knee Precaution Booklet Issued: Yes (comment) Precaution Comments: Reviewed maintaining Lt knee extension when sitting or supine Restrictions Weight Bearing Restrictions: Yes LLE Weight Bearing: Weight bearing as tolerated      Mobility  Bed Mobility Overal bed mobility: Independent             General bed mobility comments: No cues or physical assist needed.  HOB flat and no use of railings to simulate home environment.  Transfers                 General transfer comment: Not safe to attempt at this time due to residual effects of nerve block.  Pt unable to perform Lt SLR or LAQ  Ambulation/Gait                Stairs            Wheelchair Mobility    Modified Rankin (Stroke Patients Only)       Balance Overall balance assessment: Needs assistance Sitting-balance support: Feet supported Sitting balance-Leahy Scale: Good                                        Pertinent Vitals/Pain Pain Assessment: No/denies pain    Home Living Family/patient expects to be discharged to:: Private residence Living Arrangements: Spouse/significant other Available Help at Discharge: Family;Available 24 hours/day Type of Home: House Home Access: Level entry     Home Layout: Other (Comment) (4 steps down to TV room w/ Lt railing going down) Home Equipment: Walker - 4 wheels;Bedside commode;Wheelchair - manual;Cane - single point;Crutches      Prior Function Level of Independence: Independent               Hand Dominance        Extremity/Trunk Assessment   Upper Extremity Assessment: Defer to OT evaluation           Lower Extremity Assessment: LLE deficits/detail   LLE Deficits / Details: nerve block still effecting pt and unable to perform formal MMT.    Cervical / Trunk Assessment: Normal  Communication   Communication: No difficulties  Cognition Arousal/Alertness: Awake/alert Behavior During Therapy: WFL for tasks assessed/performed Overall Cognitive Status: Within Functional Limits for tasks assessed                      General Comments      Exercises  Total Joint Exercises Knee Flexion: AROM;Left;10 reps;Seated General Exercises - Lower Extremity Ankle Circles/Pumps: AROM;Both;10 reps;Supine Quad Sets: Strengthening;Both;10 reps;Supine;Other (comment) (10 second holds) Long Arc Quad: PROM;Left;5 reps;Seated      Assessment/Plan    PT Assessment Patient needs continued PT services  PT Diagnosis Difficulty walking;Abnormality of gait   PT Problem List Decreased strength;Decreased range of motion;Decreased activity tolerance;Decreased balance;Decreased mobility;Decreased knowledge of use of DME;Decreased safety awareness;Decreased knowledge of precautions  PT Treatment Interventions DME instruction;Gait training;Stair training;Functional mobility training;Therapeutic activities;Therapeutic  exercise;Balance training;Neuromuscular re-education;Patient/family education;Modalities   PT Goals (Current goals can be found in the Care Plan section) Acute Rehab PT Goals Patient Stated Goal: to get back to independence and go home PT Goal Formulation: With patient Time For Goal Achievement: 06/21/15 Potential to Achieve Goals: Good    Frequency 7X/week   Barriers to discharge        Co-evaluation               End of Session   Activity Tolerance: Patient tolerated treatment well Patient left: in bed;with call bell/phone within reach;with bed alarm set;Other (comment) (in bone foam) Nurse Communication: Mobility status;Weight bearing status;Precautions         Time: [id number] PT Time Calculation (min) (ACUTE ONLY): 15 min   Charges:   PT Evaluation $PT Eval Moderate Complexity: 1 Procedure     PT G Codes:       Collie Siad PT, DPT  Pager: [phone] Phone: [phone] 06/14/2015, 4:08 PM

## 2015-06-14 NOTE — Progress Notes (Signed)
Utilization review completed.  

## 2015-06-14 NOTE — Anesthesia Preprocedure Evaluation (Signed)
Anesthesia Evaluation  Patient identified by MRN, date of birth, ID band Patient awake    Reviewed: Allergy & Precautions, NPO status , Patient's Chart, lab work & pertinent test results  Airway Mallampati: II  TM Distance: >3 FB Neck ROM: Full    Dental no notable dental hx.    Pulmonary neg pulmonary ROS, former smoker,    Pulmonary exam normal breath sounds clear to auscultation       Cardiovascular negative cardio ROS Normal cardiovascular exam Rhythm:Regular Rate:Normal     Neuro/Psych negative neurological ROS  negative psych ROS   GI/Hepatic Neg liver ROS, GERD  ,  Endo/Other  Hypothyroidism   Renal/GU negative Renal ROS     Musculoskeletal  (+) Arthritis ,   Abdominal   Peds  Hematology negative hematology ROS (+)   Anesthesia Other Findings   Reproductive/Obstetrics negative OB ROS                             Anesthesia Physical Anesthesia Plan  ASA: II  Anesthesia Plan: Spinal   Post-op Pain Management:    Induction: Intravenous  Airway Management Planned:   Additional Equipment:   Intra-op Plan:   Post-operative Plan:   Informed Consent: I have reviewed the patients History and Physical, chart, labs and discussed the procedure including the risks, benefits and alternatives for the proposed anesthesia with the patient or authorized representative who has indicated his/her understanding and acceptance.   Dental advisory given  Plan Discussed with: CRNA  Anesthesia Plan Comments:         Anesthesia Quick Evaluation

## 2015-06-14 NOTE — Progress Notes (Signed)
Patient was on bedside commode with tech by her side. When she stood patients leg "gave out" and she sat on floor. Patients tech immediately called for assistance while trying to hold patient up. Vital signs taken and all within normal limits. Charge nurse alo at bedside. Patient alert and oriented and reminded to stay in bed. Alarm was placed on bed and MD paged.                                  Sonakshi Rolland RN

## 2015-06-14 NOTE — Transfer of Care (Signed)
Immediate Anesthesia Transfer of Care Note  Patient: Molly Ferrell  Procedure(s) Performed: Procedure(s): TOTAL KNEE REVISION POLY EXCHANGE (Left)  Patient Location: PACU  Anesthesia Type:MAC, Regional and Spinal  Level of Consciousness: awake, alert  and oriented  Airway & Oxygen Therapy: Patient Spontanous Breathing and Patient connected to nasal cannula oxygen  Post-op Assessment: Report given to RN and Post -op Vital signs reviewed and stable  Post vital signs: Reviewed and stable  Last Vitals:  Filed Vitals:   06/14/15 0921 06/14/15 0925  BP: 102/60 112/61  Pulse: 71 80  Temp:    Resp: 14 27    Complications: No apparent anesthesia complications

## 2015-06-14 NOTE — Anesthesia Procedure Notes (Addendum)
Anesthesia Regional Block:  Adductor canal block  Pre-Anesthetic Checklist: ,, timeout performed, Correct Patient, Correct Site, Correct Laterality, Correct Procedure, Correct Position, site marked, Risks and benefits discussed, Surgical consent,  Pre-op evaluation,  Post-op pain management  Laterality: Left  Prep: chloraprep       Needles:  Injection technique: Single-shot  Needle Type: Stimiplex     Needle Length: 9cm 9 cm Needle Gauge: 21 and 21 G    Additional Needles:  Procedures: ultrasound guided (picture in chart) Adductor canal block Narrative:  Injection made incrementally with aspirations every 5 mL.  Performed by: Personally  Anesthesiologist: Nolon Nations  Additional Notes: BP cuff, EKG monitors applied. Sedation begun. Artery and nerve location verified with U/S and anesthetic injected incrementally, slowly, and after negative aspirations under direct u/s guidance. Good fascial /perineural spread. Tolerated well.   Spinal Patient location during procedure: OR Staffing Anesthesiologist: Nolon Nations Performed by: anesthesiologist  Preanesthetic Checklist Completed: patient identified, site marked, surgical consent, pre-op evaluation, timeout performed, IV checked, risks and benefits discussed and monitors and equipment checked Spinal Block Patient position: sitting Prep: Betadine Patient monitoring: heart rate, continuous pulse ox and blood pressure Approach: left paramedian Location: L3-4 Injection technique: single-shot Needle Needle type: Sprotte  Needle gauge: 24 G Needle length: 9 cm Additional Notes Expiration date of kit checked and confirmed. Patient tolerated procedure well, without complications.

## 2015-06-14 NOTE — Anesthesia Postprocedure Evaluation (Signed)
Anesthesia Post Note  Patient: Molly Ferrell  Procedure(s) Performed: Procedure(s) (LRB): TOTAL KNEE REVISION POLY EXCHANGE (Left)  Patient location during evaluation: PACU Anesthesia Type: Spinal, MAC and Regional Level of consciousness: awake and alert Pain management: pain level controlled Vital Signs Assessment: post-procedure vital signs reviewed and stable Respiratory status: spontaneous breathing and respiratory function stable Cardiovascular status: blood pressure returned to baseline and stable Postop Assessment: spinal receding Anesthetic complications: no    Last Vitals:  Filed Vitals:   06/14/15 1240 06/14/15 1311  BP:  116/66  Pulse:  67  Temp: 36.7 C 36.4 C  Resp:  20    Last Pain: There were no vitals filed for this visit.               Nolon Nations

## 2015-06-14 NOTE — Progress Notes (Signed)
Orthopedic Tech Progress Note Patient Details:  Molly Ferrell 1950/07/18 [id number]  CPM Left Knee CPM Left Knee: On Left Knee Flexion (Degrees): 90 Left Knee Extension (Degrees): 0 Additional Comments: Trapeze bar and foot roll   Molly Ferrell 06/14/2015, 12:46 PM

## 2015-06-15 ENCOUNTER — Encounter (HOSPITAL_COMMUNITY): Payer: Self-pay | Admitting: Orthopedic Surgery

## 2015-06-15 DIAGNOSIS — T84019A Broken internal joint prosthesis, unspecified site, initial encounter: Secondary | ICD-10-CM | POA: Diagnosis not present

## 2015-06-15 LAB — BASIC METABOLIC PANEL
ANION GAP: 10 (ref 5–15)
BUN: 7 mg/dL (ref 6–20)
CALCIUM: 8.7 mg/dL — AB (ref 8.9–10.3)
CO2: 22 mmol/L (ref 22–32)
Chloride: 107 mmol/L (ref 101–111)
Creatinine, Ser: 0.81 mg/dL (ref 0.44–1.00)
GFR calc non Af Amer: >60 mL/min (ref >60)
Glucose, Bld: 116 mg/dL — ABNORMAL HIGH (ref 65–99)
POTASSIUM: 3.7 mmol/L (ref 3.5–5.1)
Sodium: 139 mmol/L (ref 135–145)

## 2015-06-15 LAB — CBC
HCT: 42.6 % (ref 36.0–46.0)
Hemoglobin: 14.3 g/dL (ref 12.0–15.0)
MCH: 29.3 pg (ref 26.0–34.0)
MCHC: 33.6 g/dL (ref 30.0–36.0)
MCV: 87.3 fL (ref 78.0–100.0)
PLATELETS: 199 10*3/uL (ref 150–400)
RBC: 4.88 MIL/uL (ref 3.87–5.11)
RDW: 12.6 % (ref 11.5–15.5)
WBC: 12.5 10*3/uL — AB (ref 4.0–10.5)

## 2015-06-15 MED ORDER — OXYCODONE HCL 5 MG PO TABS: 5.0000 mg | ORAL_TABLET | ORAL | Status: DC | PRN

## 2015-06-15 MED ORDER — ONDANSETRON HCL 4 MG PO TABS: 4.0000 mg | ORAL_TABLET | Freq: Four times a day (QID) | ORAL | Status: DC | PRN

## 2015-06-15 MED ORDER — OXYCODONE HCL ER 10 MG PO T12A: 10.0000 mg | EXTENDED_RELEASE_TABLET | Freq: Two times a day (BID) | ORAL | Status: DC

## 2015-06-15 MED ORDER — ENOXAPARIN SODIUM 40 MG/0.4ML ~~LOC~~ SOLN: 40.0000 mg | SUBCUTANEOUS | Status: DC

## 2015-06-15 MED ORDER — METHOCARBAMOL 500 MG PO TABS: 500.0000 mg | ORAL_TABLET | Freq: Four times a day (QID) | ORAL | Status: DC | PRN

## 2015-06-15 NOTE — Progress Notes (Signed)
Discharge instructions given. Pt verbalized understanding and all questions were answered.  

## 2015-06-15 NOTE — Care Management Note (Signed)
Case Management Note  Patient Details  Name: Molly Ferrell MRN: [id number] Date of Birth: January 30, 1951  Subjective/Objective:         S/p left total knee arthroplasty           Action/Plan: Set up with [facility] for HHPT by MD office. Spoke with patient, no change in discharge plan. [employer] has delivered CPM to home, patient already had rolling walker and 3N1. Patient stated that her husband will be assisting her after discharge.   Expected Discharge Date:                  Expected Discharge Plan:  [place]  In-House Referral:  NA  Discharge planning Services  NA  Post Acute Care Choice:  Durable Medical Equipment, Home Health Choice offered to:  Patient  DME Arranged:  CPM DME Agency:  [employer]  HH Arranged:  PT HH Agency:  [facility]  Status of Service:  Completed, signed off  Medicare Important Message Given:    Date Medicare IM Given:    Medicare IM give by:    Date Additional Medicare IM Given:    Additional Medicare Important Message give by:     If discussed at [place] of Stay Meetings, dates discussed:    Additional Comments:  Molly Nephew, RN 06/15/2015, 12:16 PM

## 2015-06-15 NOTE — Progress Notes (Signed)
SPORTS MEDICINE AND JOINT REPLACEMENT  Lara Mulch, MD   [facility] PA-C [address], [place], [place]  [address]                             [phone]   PROGRESS NOTE  Subjective:  negative for Chest Pain  negative for Shortness of Breath  negative for Nausea/Vomiting   negative for Calf Pain  negative for Bowel Movement   Tolerating Diet: yes         Patient reports pain as 6 on 0-10 scale.    Objective: Vital signs in last 24 hours:   Patient Vitals for the past 24 hrs:  BP Temp Temp src Pulse Resp SpO2 Height Weight  06/15/15 0045 (!) 111/59 mmHg 98.2 F (36.8 C) Oral 70 - 96 % - -  06/14/15 2000 132/76 mmHg 97.7 F (36.5 C) Oral 77 - 100 % - -  06/14/15 1311 116/66 mmHg 97.6 F (36.4 C) Oral 67 20 100 % - -  06/14/15 1240 - 98 F (36.7 C) - - - - - -  06/14/15 1230 109/71 mmHg - - 61 18 98 % - -  06/14/15 1215 116/68 mmHg - - 66 17 99 % - -  06/14/15 1200 105/64 mmHg - - 63 16 98 % - -  06/14/15 1145 108/62 mmHg - - 64 15 100 % - -  06/14/15 1135 98/68 mmHg 98 F (36.7 C) - - - - - -  06/14/15 0925 112/61 mmHg - - 80 (!) 27 98 % - -  06/14/15 0921 102/60 mmHg - - 71 14 100 % - -  06/14/15 0915 - - - 71 15 100 % - -  06/14/15 0912 - - - 65 - 100 % - -  06/14/15 0825 126/70 mmHg 97.8 F (36.6 C) Oral 71 20 99 % 5' (1.524 m) 54.885 kg (121 lb)    @flow {1959:LAST@   Intake/Output from previous day:   04/17 0701 - 04/18 0700 In: 1400 [P.O.:200; I.V.:1200] Out: 50    Intake/Output this shift:       Intake/Output      04/17 0701 - 04/18 0700 04/18 0701 - 04/19 0700   P.O. 200    I.V. (mL/kg) 1200 (21.9)    Total Intake(mL/kg) 1400 (25.5)    Blood 50    Total Output 50     Net +1350             LABORATORY DATA:  Recent Labs  06/14/15 1324  WBC 10.7*  HGB 14.4  HCT 42.6  PLT 188    Recent Labs  06/14/15 1324  CREATININE 0.65   Lab Results  Component Value Date   INR 1.03 06/03/2015    Examination:  General appearance:  alert, cooperative and no distress Extremities: extremities normal, atraumatic, no cyanosis or edema  Wound Exam: clean, dry, intact   Drainage:  None: wound tissue dry  Motor Exam: Quadriceps and Hamstrings Intact  Sensory Exam: Superficial Peroneal, Deep Peroneal and Tibial normal   Assessment:    1 Day Post-Op  Procedure(s) (LRB): TOTAL KNEE REVISION POLY EXCHANGE (Left)  ADDITIONAL DIAGNOSIS:  Active Problems:   S/P revision of total knee  Acute Blood Loss Anemia   Plan: Physical Therapy as ordered Weight Bearing as Tolerated (WBAT)  DVT Prophylaxis:  Lovenox  DISCHARGE PLAN: Home  DISCHARGE NEEDS: HHPT   Patient looks great. Discharge today once cleared  from [place] 06/15/2015, 7:13 AM

## 2015-06-15 NOTE — Progress Notes (Signed)
Physical Therapy Treatment Patient Details Name: Molly Ferrell MRN: [id number] DOB: [birth year] Today's Date: 06/15/2015    History of Present Illness Pt is a 65 y/o F s/p revision Lt TKA.  Pt's PMH includes macular degeneration both eyes, HOH.    PT Comments    Patient is making good progress with PT, able to increase ambulation distance and perform stairs. From a mobility standpoint anticipate patient will be ready for DC home with family support when medically released. Patient denies any questions or concerns.        Follow Up Recommendations  Home health PT;Supervision for mobility/OOB     Equipment Recommendations  None recommended by PT    Recommendations for Other Services       Precautions / Restrictions Precautions Precautions: Fall;Knee Restrictions Weight Bearing Restrictions: Yes LLE Weight Bearing: Weight bearing as tolerated    Mobility  Bed Mobility               General bed mobility comments: up in chair upon arrival  Transfers Overall transfer level: Needs assistance Equipment used: Rolling walker (2 wheeled) Transfers: Sit to/from Stand Sit to Stand: Supervision         General transfer comment: supervision for safety  Ambulation/Gait Ambulation/Gait assistance: Min guard;Supervision Ambulation Distance (Feet): 225 Feet Assistive device: Rolling walker (2 wheeled) Gait Pattern/deviations: Step-through pattern Gait velocity: decreased (mild)   General Gait Details: cues for even strides and posture as needed. Heel-toe pattern encouraged   Stairs Stairs: Yes Stairs assistance: Min guard Stair Management: One rail Right;Step to pattern;Forwards Number of Stairs: 5 General stair comments: stable and consistent pattern.   Wheelchair Mobility    Modified Rankin (Stroke Patients Only)       Balance Overall balance assessment: Needs assistance Sitting-balance support: No upper extremity supported Sitting balance-Leahy Scale:  Good     Standing balance support: During functional activity Standing balance-Leahy Scale: Fair Standing balance comment: rw during ambulation                    Cognition Arousal/Alertness: Awake/alert Behavior During Therapy: WFL for tasks assessed/performed Overall Cognitive Status: Within Functional Limits for tasks assessed                      Exercises      General Comments        Pertinent Vitals/Pain Pain Assessment: No/denies pain    Home Living                      Prior Function            PT Goals (current goals can now be found in the care plan section) Acute Rehab PT Goals Patient Stated Goal: get back to being active PT Goal Formulation: With patient Time For Goal Achievement: 06/21/15 Potential to Achieve Goals: Good Progress towards PT goals: Progressing toward goals    Frequency  7X/week    PT Plan Current plan remains appropriate    Co-evaluation             End of Session Equipment Utilized During Treatment: Gait belt Activity Tolerance: Patient tolerated treatment well Patient left: in chair;with call bell/phone within reach;Other (comment) (in knee extension)     Time: [id number] PT Time Calculation (min) (ACUTE ONLY): 21 min  Charges:  $Gait Training: 8-22 mins                    G Codes:  Cassell Clement, PT, CSCS Pager [phone] Office [phone]  06/15/2015, 12:55 PM

## 2015-06-15 NOTE — Progress Notes (Signed)
Physical Therapy Treatment Patient Details Name: Molly Ferrell MRN: [id number] DOB: 26-Oct-1950 Today's Date: 06/15/2015    History of Present Illness Pt is a 65 y/o F s/p revision Lt TKA.  Pt's PMH includes macular degeneration both eyes, HOH.    PT Comments    Pt progressing well with mobility during session. Pt did complain of nausea with sitting up in bed but this did improved over time. Noted instability through LLE during gait but no buckling, encouraging continued use of rw and slower cadence for improved stability. Will follow for stairs and gait progression prior to D/C.   Follow Up Recommendations  Home health PT;Supervision for mobility/OOB     Equipment Recommendations  None recommended by PT    Recommendations for Other Services       Precautions / Restrictions Precautions Precautions: Fall;Knee Restrictions Weight Bearing Restrictions: Yes LLE Weight Bearing: Weight bearing as tolerated    Mobility  Bed Mobility Overal bed mobility: Independent             General bed mobility comments: reports feeling nauseated with initial sitting up, no emesis and improving with time.   Transfers Overall transfer level: Needs assistance Equipment used: Rolling walker (2 wheeled) Transfers: Sit to/from Stand Sit to Stand: Min guard         General transfer comment: cues for hand placement  Ambulation/Gait Ambulation/Gait assistance: Min guard Ambulation Distance (Feet): 80 Feet Assistive device: Rolling walker (2 wheeled) Gait Pattern/deviations: Step-to pattern;Decreased step length - right;Decreased weight shift to left     General Gait Details: mild instability through LLE but no buckling noted. Encouraging decreased cadence with focus on stability.    Stairs            Wheelchair Mobility    Modified Rankin (Stroke Patients Only)       Balance Overall balance assessment: Needs assistance Sitting-balance support: No upper extremity  supported Sitting balance-Leahy Scale: Good     Standing balance support: Bilateral upper extremity supported Standing balance-Leahy Scale: Poor Standing balance comment: using rw                     Cognition Arousal/Alertness: Awake/alert Behavior During Therapy: WFL for tasks assessed/performed Overall Cognitive Status: Within Functional Limits for tasks assessed                      Exercises Total Joint Exercises Ankle Circles/Pumps: AROM;Both;10 reps Quad Sets: Strengthening;Left;10 reps Short Arc Quad: Strengthening;Left;10 reps Heel Slides: AAROM;Left;10 reps Straight Leg Raises: Strengthening;Left;10 reps Goniometric ROM: approx. 2-90    General Comments        Pertinent Vitals/Pain Pain Assessment: No/denies pain    Home Living                      Prior Function            PT Goals (current goals can now be found in the care plan section) Acute Rehab PT Goals Patient Stated Goal: get back to being active PT Goal Formulation: With patient Time For Goal Achievement: 06/21/15 Potential to Achieve Goals: Good Progress towards PT goals: Progressing toward goals    Frequency  7X/week    PT Plan Current plan remains appropriate    Co-evaluation             End of Session Equipment Utilized During Treatment: Gait belt Activity Tolerance: Patient tolerated treatment well Patient left: in chair;with call bell/phone within reach (  in knee extension)     Time: [id number] PT Time Calculation (min) (ACUTE ONLY): 25 min  Charges:  $Gait Training: 8-22 mins $Therapeutic Exercise: 8-22 mins                    G Codes:      Cassell Clement, PT, CSCS Pager [phone] Office [phone]  06/15/2015, 8:44 AM

## 2015-06-15 NOTE — Progress Notes (Signed)
OT Cancellation Note and Discharge  Patient Details Name: Molly Ferrell MRN: [id number] DOB: 07/12/1950   Cancelled Treatment:    Reason Eval/Treat Not Completed: OT screened, no needs identified, will sign off. In to see pt for OT eval, pt reports that she does not foresee any issues with being able to complete basic ADLs at home and has A from husband prn. Pt reports she dressed herself to go home today and was aware to put LLE in first.   Almon Register [id number] 06/15/2015, 1:46 PM

## 2015-06-16 DIAGNOSIS — K219 Gastro-esophageal reflux disease without esophagitis: Secondary | ICD-10-CM | POA: Diagnosis not present

## 2015-06-16 DIAGNOSIS — E7800 Pure hypercholesterolemia, unspecified: Secondary | ICD-10-CM | POA: Diagnosis not present

## 2015-06-16 DIAGNOSIS — Z96652 Presence of left artificial knee joint: Secondary | ICD-10-CM | POA: Diagnosis not present

## 2015-06-16 DIAGNOSIS — Z471 Aftercare following joint replacement surgery: Secondary | ICD-10-CM | POA: Diagnosis not present

## 2015-06-16 DIAGNOSIS — H9190 Unspecified hearing loss, unspecified ear: Secondary | ICD-10-CM | POA: Diagnosis not present

## 2015-06-16 DIAGNOSIS — Z7901 Long term (current) use of anticoagulants: Secondary | ICD-10-CM | POA: Diagnosis not present

## 2015-06-16 DIAGNOSIS — H3530 Unspecified macular degeneration: Secondary | ICD-10-CM | POA: Diagnosis not present

## 2015-06-16 DIAGNOSIS — E039 Hypothyroidism, unspecified: Secondary | ICD-10-CM | POA: Diagnosis not present

## 2015-06-16 DIAGNOSIS — Z87891 Personal history of nicotine dependence: Secondary | ICD-10-CM | POA: Diagnosis not present

## 2015-06-16 NOTE — Op Note (Signed)
Dictation Number:  [id number]

## 2015-06-17 NOTE — Op Note (Signed)
NAME:  Molly Ferrell, Molly Ferrell            ACCOUNT NO.:  [id number]  MEDICAL RECORD NO.:  [id number]  LOCATION:  [id number]                        FACILITY:  [facility]  PHYSICIAN:  Estill Bamberg. Ronnie Derby, M.D. DATE OF BIRTH:  December 08, 1950  DATE OF PROCEDURE:  06/14/2015 DATE OF DISCHARGE:  06/15/2015                              OPERATIVE REPORT   SURGEON:  Estill Bamberg. Ronnie Derby, M.D.  ASSISTANT:  Grier Mitts, PA-C.  ANESTHESIA:  General.  PREOPERATIVE DIAGNOSIS:  Left knee painful, total knee.  POSTOPERATIVE DIAGNOSIS:  Left knee painful, total knee.  PROCEDURE:  Left knee revision (1 component only) plus open synovectomy.  INDICATION FOR PROCEDURE:  The patient is a 65 year old, white female, with instability and pain 2 years out from a left total knee replacement.  Informed consent obtained.  DESCRIPTION OF PROCEDURE:  The patient was laid supine, administered general anesthesia.  Left leg was prepped and draped in usual fashion. The extremity was exsanguinated with the Esmarch and the tourniquet inflated to 300 mmHg.  A #10 blade was used to make a midline incision through the old incision.  A new 10 blade was used to make a median parapatellar arthrotomy and performed synovectomy.  There was a fair amount of scar tissue in the knee, but I must admit her preoperative motion was good.  Once I completed the medial and lateral gutter and superior synovectomy, I then evaluated the leg from a range of motion and stability standpoint.  I felt that she did have pretty significant instability in extension, moderate in mid flexion, and minimal in flexion.  I then removed the polyethylene and removed scar tissue around the back of the knee.  I then used the trials from the [employer] knee system, and I felt that a size 11 which was 2 mm larger than the prior one gave excellent stability.  I felt by removing all the scar tissue and putting in this larger polyethylene that I had eliminated the problem.  I then  lavaged the knee copiously.  Infiltrated with Exparel, Marcaine, and saline mixture throughout the periarticular tissues.  I then snapped in the new size 11 mm size 3 polyethylene.  I then let the tourniquet down to obtain hemostasis.  I then closed the arthrotomy with figure-of-eight #1 Vicryl sutures, buried 0 Vicryl suture, subcuticular 2-0 Vicryl sutures, and skin with staples.  Dressed with Xeroform, dressing sponges, sterile Webril, and an Ace wrap.  COMPLICATIONS:  None.  DRAINS:  None.          ______________________________ Estill Bamberg. Ronnie Derby, M.D.     SDL/MEDQ  D:  06/16/2015  T:  06/16/2015  Job:  [id number]

## 2015-06-19 DIAGNOSIS — Z471 Aftercare following joint replacement surgery: Secondary | ICD-10-CM | POA: Diagnosis not present

## 2015-06-19 DIAGNOSIS — H3530 Unspecified macular degeneration: Secondary | ICD-10-CM | POA: Diagnosis not present

## 2015-06-19 DIAGNOSIS — E039 Hypothyroidism, unspecified: Secondary | ICD-10-CM | POA: Diagnosis not present

## 2015-06-19 DIAGNOSIS — H9190 Unspecified hearing loss, unspecified ear: Secondary | ICD-10-CM | POA: Diagnosis not present

## 2015-06-19 DIAGNOSIS — Z96652 Presence of left artificial knee joint: Secondary | ICD-10-CM | POA: Diagnosis not present

## 2015-06-19 DIAGNOSIS — Z87891 Personal history of nicotine dependence: Secondary | ICD-10-CM | POA: Diagnosis not present

## 2015-06-19 DIAGNOSIS — E7800 Pure hypercholesterolemia, unspecified: Secondary | ICD-10-CM | POA: Diagnosis not present

## 2015-06-19 DIAGNOSIS — K219 Gastro-esophageal reflux disease without esophagitis: Secondary | ICD-10-CM | POA: Diagnosis not present

## 2015-06-19 DIAGNOSIS — Z7901 Long term (current) use of anticoagulants: Secondary | ICD-10-CM | POA: Diagnosis not present

## 2015-06-28 DIAGNOSIS — M25462 Effusion, left knee: Secondary | ICD-10-CM | POA: Diagnosis not present

## 2015-06-28 DIAGNOSIS — T84019A Broken internal joint prosthesis, unspecified site, initial encounter: Secondary | ICD-10-CM | POA: Diagnosis not present

## 2015-06-28 DIAGNOSIS — Z96652 Presence of left artificial knee joint: Secondary | ICD-10-CM | POA: Diagnosis not present

## 2015-06-28 DIAGNOSIS — R262 Difficulty in walking, not elsewhere classified: Secondary | ICD-10-CM | POA: Diagnosis not present

## 2015-06-28 DIAGNOSIS — M25562 Pain in left knee: Secondary | ICD-10-CM | POA: Diagnosis not present

## 2015-06-28 NOTE — Discharge Summary (Signed)
SPORTS MEDICINE & JOINT REPLACEMENT   Lara Mulch, MD   Carlynn Spry, PA-C Carlyon Shadow, PA-C [place], [place], [place]  [id number]                             [phone]  PATIENT ID: Molly Ferrell        MRN:  [id number]          DOB/AGE: April 02, 1950 / 65 y.o.    DISCHARGE SUMMARY  ADMISSION DATE:    06/14/2015 DISCHARGE DATE:   06/15/2015   ADMISSION DIAGNOSIS: left failed total knee arthroplasty    DISCHARGE DIAGNOSIS:  left failed total knee arthroplasty    ADDITIONAL DIAGNOSIS: Active Problems:   S/P revision of total knee  Past Medical History  Diagnosis Date  . Complication of anesthesia     "many years ago, had problems waking up"  . GERD (gastroesophageal reflux disease)   . Hypercholesteremia   . Hypertriglyceridemia   . Hypothyroidism   . Macular degeneration of both eyes   . History of bronchitis   . Arthritis   . Hard of hearing     PROCEDURE: Procedure(s): TOTAL KNEE REVISION POLY EXCHANGE on 06/14/2015  CONSULTS:     HISTORY:  See H&P in chart  HOSPITAL COURSE:  Molly Ferrell is a 65 y.o. admitted on 06/14/2015 and found to have a diagnosis of left failed total knee arthroplasty.  After appropriate laboratory studies were obtained  they were taken to the operating room on 06/14/2015 and underwent Procedure(s): TOTAL KNEE REVISION POLY EXCHANGE.   They were given perioperative antibiotics:  Anti-infectives    Start     Dose/Rate Route Frequency Ordered Stop   06/15/15 0600  ceFAZolin (ANCEF) 2 g in dextrose 5 % 50 mL IVPB  Status:  Discontinued     2 g 140 mL/hr over 30 Minutes Intravenous On call to O.R. 06/14/15 1838 06/14/15 2134   06/15/15 0600  ceFAZolin (ANCEF) 2 g in dextrose 5 % 50 mL IVPB  Status:  Discontinued     2 g 140 mL/hr over 30 Minutes Intravenous To Surgery 06/14/15 2134 06/15/15 1720   06/14/15 1600  clindamycin (CLEOCIN) IVPB 600 mg     600 mg 100 mL/hr over 30 Minutes Intravenous Every 6 hours 06/14/15 1308  06/14/15 2223   06/14/15 0827  ceFAZolin (ANCEF) 2-4 GM/100ML-% IVPB    Comments:  Ancil Boozer  : cabinet override      06/14/15 0827 06/14/15 1001    .  Patient given tranexamic acid IV or topical and exparel intra-operatively.  Tolerated the procedure well.    POD# 1: Vital signs were stable.  Patient denied Chest pain, shortness of breath, or calf pain.  Patient was started on Lovenox 30 mg subcutaneously twice daily at 8am.  Consults to PT, OT, and care management were made.  The patient was weight bearing as tolerated.  CPM was placed on the operative leg 0-90 degrees for 6-8 hours a day. When out of the CPM, patient was placed in the foam block to achieve full extension. Incentive spirometry was taught.  Dressing was changed.       POD #2, Continued  PT for ambulation and exercise program.  IV saline locked.  O2 discontinued.    The remainder of the hospital course was dedicated to ambulation and strengthening.   The patient was discharged on 1 day post op in  Good condition.  Blood products given:none  DIAGNOSTIC STUDIES: Recent vital signs: No data found.      Recent laboratory studies: No results for input(s): WBC, HGB, HCT, PLT in the last 168 hours. No results for input(s): NA, K, CL, CO2, BUN, CREATININE, GLUCOSE, CALCIUM in the last 168 hours. Lab Results  Component Value Date   INR 1.03 06/03/2015     Recent Radiographic Studies :  Dg Chest 2 View  06/03/2015  CLINICAL DATA:  Preoperative evaluation for total knee replacement. History of tobacco use EXAM: CHEST  2 VIEW COMPARISON:  Jul 03, 2014 FINDINGS: There is no edema or consolidation. The heart size and pulmonary vascularity are normal. No adenopathy. There is lower thoracic levoscoliosis. There is degenerative change in the thoracic spine. IMPRESSION: No edema or consolidation. Electronically Signed   By: Lowella Grip III M.D.   On: 06/03/2015 14:45    DISCHARGE INSTRUCTIONS: Discharge Instructions     CPM    Complete by:  As directed   Continuous passive motion machine (CPM):      Use the CPM from 0 to 90 for 4-6 hours per day.      You may increase by 10 per day.  You may break it up into 2 or 3 sessions per day.      Use CPM for 2 weeks or until you are told to stop.     Call MD / Call 911    Complete by:  As directed   If you experience chest pain or shortness of breath, CALL 911 and be transported to the hospital emergency room.  If you develope a fever above 101 F, pus (white drainage) or increased drainage or redness at the wound, or calf pain, call your surgeon's office.     Change dressing    Complete by:  As directed   Change dressing on tomorrow, then change the dressing daily with sterile 4 x 4 inch gauze dressing and apply TED hose.  You may clean the incision with alcohol prior to redressing.     Constipation Prevention    Complete by:  As directed   Drink plenty of fluids.  Prune juice may be helpful.  You may use a stool softener, such as Colace (over the counter) 100 mg twice a day.  Use MiraLax (over the counter) for constipation as needed.     Diet - low sodium heart healthy    Complete by:  As directed      Discharge instructions    Complete by:  As directed   INSTRUCTIONS AFTER JOINT REPLACEMENT   Remove items at home which could result in a fall. This includes throw rugs or furniture in walking pathways ICE to the affected joint every three hours while awake for 30 minutes at a time, for at least the first 3-5 days, and then as needed for pain and swelling.  Continue to use ice for pain and swelling. You may notice swelling that will progress down to the foot and ankle.  This is normal after surgery.  Elevate your leg when you are not up walking on it.   Continue to use the breathing machine you got in the hospital (incentive spirometer) which will help keep your temperature down.  It is common for your temperature to cycle up and down following surgery, especially  at night when you are not up moving around and exerting yourself.  The breathing machine keeps your lungs expanded and your temperature down.   DIET:  As you were doing prior to hospitalization, we recommend a well-balanced diet.  DRESSING / WOUND CARE / SHOWERING  You may change your dressing 3-5 days after surgery.  Then change the dressing every day with sterile gauze.  Please use good hand washing techniques before changing the dressing.  Do not use any lotions or creams on the incision until instructed by your surgeon.  ACTIVITY  Increase activity slowly as tolerated, but follow the weight bearing instructions below.   No driving for 6 weeks or until further direction given by your physician.  You cannot drive while taking narcotics.  No lifting or carrying greater than 10 lbs. until further directed by your surgeon. Avoid periods of inactivity such as sitting longer than an hour when not asleep. This helps prevent blood clots.  You may return to work once you are authorized by your doctor.     WEIGHT BEARING   Weight bearing as tolerated with assist device (walker, cane, etc) as directed, use it as long as suggested by your surgeon or therapist, typically at least 4-6 weeks.   EXERCISES  Results after joint replacement surgery are often greatly improved when you follow the exercise, range of motion and muscle strengthening exercises prescribed by your doctor. Safety measures are also important to protect the joint from further injury. Any time any of these exercises cause you to have increased pain or swelling, decrease what you are doing until you are comfortable again and then slowly increase them. If you have problems or questions, call your caregiver or physical therapist for advice.   Rehabilitation is important following a joint replacement. After just a few days of immobilization, the muscles of the leg can become weakened and shrink (atrophy).  These exercises are designed  to build up the tone and strength of the thigh and leg muscles and to improve motion. Often times heat used for twenty to thirty minutes before working out will loosen up your tissues and help with improving the range of motion but do not use heat for the first two weeks following surgery (sometimes heat can increase post-operative swelling).   These exercises can be done on a training (exercise) mat, on the floor, on a table or on a bed. Use whatever works the best and is most comfortable for you.    Use music or television while you are exercising so that the exercises are a pleasant break in your day. This will make your life better with the exercises acting as a break in your routine that you can look forward to.   Perform all exercises about fifteen times, three times per day or as directed.  You should exercise both the operative leg and the other leg as well.   Exercises include:   Quad Sets - Tighten up the muscle on the front of the thigh (Quad) and hold for 5-10 seconds.   Straight Leg Raises - With your knee straight (if you were given a brace, keep it on), lift the leg to 60 degrees, hold for 3 seconds, and slowly lower the leg.  Perform this exercise against resistance later as your leg gets stronger.  Leg Slides: Lying on your back, slowly slide your foot toward your buttocks, bending your knee up off the floor (only go as far as is comfortable). Then slowly slide your foot back down until your leg is flat on the floor again.  Angel Wings: Lying on your back spread your legs to the side as far apart  as you can without causing discomfort.  Hamstring Strength:  Lying on your back, push your heel against the floor with your leg straight by tightening up the muscles of your buttocks.  Repeat, but this time bend your knee to a comfortable angle, and push your heel against the floor.  You may put a pillow under the heel to make it more comfortable if necessary.   A rehabilitation program  following joint replacement surgery can speed recovery and prevent re-injury in the future due to weakened muscles. Contact your doctor or a physical therapist for more information on knee rehabilitation.    CONSTIPATION  Constipation is defined medically as fewer than three stools per week and severe constipation as less than one stool per week.  Even if you have a regular bowel pattern at home, your normal regimen is likely to be disrupted due to multiple reasons following surgery.  Combination of anesthesia, postoperative narcotics, change in appetite and fluid intake all can affect your bowels.   YOU MUST use at least one of the following options; they are listed in order of increasing strength to get the job done.  They are all available over the counter, and you may need to use some, POSSIBLY even all of these options:    Drink plenty of fluids (prune juice may be helpful) and high fiber foods Colace 100 mg by mouth twice a day  Senokot for constipation as directed and as needed Dulcolax (bisacodyl), take with full glass of water  Miralax (polyethylene glycol) once or twice a day as needed.  If you have tried all these things and are unable to have a bowel movement in the first 3-4 days after surgery call either your surgeon or your primary doctor.    If you experience loose stools or diarrhea, hold the medications until you stool forms back up.  If your symptoms do not get better within 1 week or if they get worse, check with your doctor.  If you experience "the worst abdominal pain ever" or develop nausea or vomiting, please contact the office immediately for further recommendations for treatment.   ITCHING:  If you experience itching with your medications, try taking only a single pain pill, or even half a pain pill at a time.  You can also use Benadryl over the counter for itching or also to help with sleep.   TED HOSE STOCKINGS:  Use stockings on both legs until for at least 2 weeks  or as directed by physician office. They may be removed at night for sleeping.  MEDICATIONS:  See your medication summary on the "After Visit Summary" that nursing will review with you.  You may have some home medications which will be placed on hold until you complete the course of blood thinner medication.  It is important for you to complete the blood thinner medication as prescribed.  PRECAUTIONS:  If you experience chest pain or shortness of breath - call 911 immediately for transfer to the hospital emergency department.   If you develop a fever greater that 101 F, purulent drainage from wound, increased redness or drainage from wound, foul odor from the wound/dressing, or calf pain - CONTACT YOUR SURGEON.                                                   FOLLOW-UP  APPOINTMENTS:  If you do not already have a post-op appointment, please call the office for an appointment to be seen by your surgeon.  Guidelines for how soon to be seen are listed in your "After Visit Summary", but are typically between 1-4 weeks after surgery.  OTHER INSTRUCTIONS:   Knee Replacement:  Do not place pillow under knee, focus on keeping the knee straight while resting. CPM instructions: 0-90 degrees, 2 hours in the morning, 2 hours in the afternoon, and 2 hours in the evening. Place foam block, curve side up under heel at all times except when in CPM or when walking.  DO NOT modify, tear, cut, or change the foam block in any way.  MAKE SURE YOU:  Understand these instructions.  Get help right away if you are not doing well or get worse.    Thank you for letting us be a part of your medical care team.  It is a privilege we respect greatly.  We hope these instructions will help you stay on track for a fast and full recovery!     Driving restrictions    Complete by:  As directed   No driving for 2 weeks     Increase activity slowly as tolerated    Complete by:  As directed            DISCHARGE MEDICATIONS:      Medication List    STOP taking these medications        aspirin 81 MG tablet      TAKE these medications        enoxaparin 40 MG/0.4ML injection  Commonly known as:  LOVENOX  Inject 0.4 mLs (40 mg total) into the skin daily.     levothyroxine 88 MCG tablet  Commonly known as:  SYNTHROID, LEVOTHROID  Take 88 mcg by mouth daily before breakfast.     liothyronine 25 MCG tablet  Commonly known as:  CYTOMEL  Take 25 mcg by mouth daily.     methocarbamol 500 MG tablet  Commonly known as:  ROBAXIN  Take 1-2 tablets (500-1,000 mg total) by mouth every 6 (six) hours as needed for muscle spasms.     multivitamin-lutein Caps capsule  Take 1 capsule by mouth 2 (two) times daily.     omeprazole 40 MG capsule  Commonly known as:  PRILOSEC  Take 40 mg by mouth daily.     ondansetron 4 MG tablet  Commonly known as:  ZOFRAN  Take 1 tablet (4 mg total) by mouth every 6 (six) hours as needed for nausea.     oxyCODONE 5 MG immediate release tablet  Commonly known as:  Oxy IR/ROXICODONE  Take 1-2 tablets (5-10 mg total) by mouth every 3 (three) hours as needed for breakthrough pain.     oxyCODONE 10 mg 12 hr tablet  Commonly known as:  OXYCONTIN  Take 1 tablet (10 mg total) by mouth every 12 (twelve) hours.        FOLLOW UP VISIT:       Follow-up Information    Follow up with [facility].   Why:  They will contact you to schedule home therapy visits.   Contact information:   [place] [address] [phone]       Call Rudean Haskell, MD.   Specialty:  Orthopedic Surgery   Contact information:   [address] [place] [address] [phone]       DISPOSITION: HOME VS.  SNF  CONDITION:  Good   Donia Ast 06/28/2015, 6:42 AM

## 2015-06-30 DIAGNOSIS — R262 Difficulty in walking, not elsewhere classified: Secondary | ICD-10-CM | POA: Diagnosis not present

## 2015-06-30 DIAGNOSIS — M25562 Pain in left knee: Secondary | ICD-10-CM | POA: Diagnosis not present

## 2015-06-30 DIAGNOSIS — M25462 Effusion, left knee: Secondary | ICD-10-CM | POA: Diagnosis not present

## 2015-06-30 DIAGNOSIS — Z96652 Presence of left artificial knee joint: Secondary | ICD-10-CM | POA: Diagnosis not present

## 2015-07-02 DIAGNOSIS — M25462 Effusion, left knee: Secondary | ICD-10-CM | POA: Diagnosis not present

## 2015-07-02 DIAGNOSIS — R262 Difficulty in walking, not elsewhere classified: Secondary | ICD-10-CM | POA: Diagnosis not present

## 2015-07-02 DIAGNOSIS — M25562 Pain in left knee: Secondary | ICD-10-CM | POA: Diagnosis not present

## 2015-07-02 DIAGNOSIS — Z96652 Presence of left artificial knee joint: Secondary | ICD-10-CM | POA: Diagnosis not present

## 2015-07-05 DIAGNOSIS — M25562 Pain in left knee: Secondary | ICD-10-CM | POA: Diagnosis not present

## 2015-07-05 DIAGNOSIS — Z96652 Presence of left artificial knee joint: Secondary | ICD-10-CM | POA: Diagnosis not present

## 2015-07-05 DIAGNOSIS — R262 Difficulty in walking, not elsewhere classified: Secondary | ICD-10-CM | POA: Diagnosis not present

## 2015-07-05 DIAGNOSIS — M25462 Effusion, left knee: Secondary | ICD-10-CM | POA: Diagnosis not present

## 2015-07-09 DIAGNOSIS — Z96652 Presence of left artificial knee joint: Secondary | ICD-10-CM | POA: Diagnosis not present

## 2015-07-09 DIAGNOSIS — R262 Difficulty in walking, not elsewhere classified: Secondary | ICD-10-CM | POA: Diagnosis not present

## 2015-07-09 DIAGNOSIS — M25562 Pain in left knee: Secondary | ICD-10-CM | POA: Diagnosis not present

## 2015-07-09 DIAGNOSIS — M25462 Effusion, left knee: Secondary | ICD-10-CM | POA: Diagnosis not present

## 2015-07-12 DIAGNOSIS — Z96652 Presence of left artificial knee joint: Secondary | ICD-10-CM | POA: Diagnosis not present

## 2015-07-12 DIAGNOSIS — R262 Difficulty in walking, not elsewhere classified: Secondary | ICD-10-CM | POA: Diagnosis not present

## 2015-07-12 DIAGNOSIS — M25562 Pain in left knee: Secondary | ICD-10-CM | POA: Diagnosis not present

## 2015-07-12 DIAGNOSIS — M25462 Effusion, left knee: Secondary | ICD-10-CM | POA: Diagnosis not present

## 2015-07-16 DIAGNOSIS — R262 Difficulty in walking, not elsewhere classified: Secondary | ICD-10-CM | POA: Diagnosis not present

## 2015-07-16 DIAGNOSIS — Z96652 Presence of left artificial knee joint: Secondary | ICD-10-CM | POA: Diagnosis not present

## 2015-07-16 DIAGNOSIS — M25462 Effusion, left knee: Secondary | ICD-10-CM | POA: Diagnosis not present

## 2015-07-16 DIAGNOSIS — M25562 Pain in left knee: Secondary | ICD-10-CM | POA: Diagnosis not present

## 2015-07-22 DIAGNOSIS — Z96652 Presence of left artificial knee joint: Secondary | ICD-10-CM | POA: Diagnosis not present

## 2015-07-22 DIAGNOSIS — R262 Difficulty in walking, not elsewhere classified: Secondary | ICD-10-CM | POA: Diagnosis not present

## 2015-07-22 DIAGNOSIS — M25562 Pain in left knee: Secondary | ICD-10-CM | POA: Diagnosis not present

## 2015-07-22 DIAGNOSIS — M25462 Effusion, left knee: Secondary | ICD-10-CM | POA: Diagnosis not present

## 2015-07-28 DIAGNOSIS — Z96652 Presence of left artificial knee joint: Secondary | ICD-10-CM | POA: Diagnosis not present

## 2015-07-28 DIAGNOSIS — M25562 Pain in left knee: Secondary | ICD-10-CM | POA: Diagnosis not present

## 2015-07-28 DIAGNOSIS — R262 Difficulty in walking, not elsewhere classified: Secondary | ICD-10-CM | POA: Diagnosis not present

## 2015-07-28 DIAGNOSIS — M25462 Effusion, left knee: Secondary | ICD-10-CM | POA: Diagnosis not present

## 2015-09-09 DIAGNOSIS — E28310 Symptomatic premature menopause: Secondary | ICD-10-CM | POA: Diagnosis not present

## 2015-09-09 DIAGNOSIS — E7800 Pure hypercholesterolemia, unspecified: Secondary | ICD-10-CM | POA: Diagnosis not present

## 2015-09-09 DIAGNOSIS — E039 Hypothyroidism, unspecified: Secondary | ICD-10-CM | POA: Diagnosis not present

## 2015-09-09 DIAGNOSIS — R7989 Other specified abnormal findings of blood chemistry: Secondary | ICD-10-CM | POA: Diagnosis not present

## 2015-09-09 DIAGNOSIS — E559 Vitamin D deficiency, unspecified: Secondary | ICD-10-CM | POA: Diagnosis not present

## 2015-09-13 DIAGNOSIS — Z01419 Encounter for gynecological examination (general) (routine) without abnormal findings: Secondary | ICD-10-CM | POA: Diagnosis not present

## 2015-09-28 DIAGNOSIS — Z1231 Encounter for screening mammogram for malignant neoplasm of breast: Secondary | ICD-10-CM | POA: Diagnosis not present

## 2015-12-01 DIAGNOSIS — H353131 Nonexudative age-related macular degeneration, bilateral, early dry stage: Secondary | ICD-10-CM | POA: Diagnosis not present

## 2016-03-09 DIAGNOSIS — E28310 Symptomatic premature menopause: Secondary | ICD-10-CM | POA: Diagnosis not present

## 2016-03-09 DIAGNOSIS — E039 Hypothyroidism, unspecified: Secondary | ICD-10-CM | POA: Diagnosis not present

## 2016-03-09 DIAGNOSIS — D510 Vitamin B12 deficiency anemia due to intrinsic factor deficiency: Secondary | ICD-10-CM | POA: Diagnosis not present

## 2016-03-09 DIAGNOSIS — R7989 Other specified abnormal findings of blood chemistry: Secondary | ICD-10-CM | POA: Diagnosis not present

## 2016-03-09 DIAGNOSIS — E559 Vitamin D deficiency, unspecified: Secondary | ICD-10-CM | POA: Diagnosis not present

## 2016-03-27 DIAGNOSIS — Z471 Aftercare following joint replacement surgery: Secondary | ICD-10-CM | POA: Diagnosis not present

## 2016-03-27 DIAGNOSIS — G8929 Other chronic pain: Secondary | ICD-10-CM | POA: Diagnosis not present

## 2016-03-27 DIAGNOSIS — M25561 Pain in right knee: Secondary | ICD-10-CM | POA: Diagnosis not present

## 2016-03-27 DIAGNOSIS — Z96652 Presence of left artificial knee joint: Secondary | ICD-10-CM | POA: Diagnosis not present

## 2016-03-27 DIAGNOSIS — M1711 Unilateral primary osteoarthritis, right knee: Secondary | ICD-10-CM | POA: Diagnosis not present

## 2016-04-05 DIAGNOSIS — E039 Hypothyroidism, unspecified: Secondary | ICD-10-CM | POA: Diagnosis not present

## 2016-04-05 DIAGNOSIS — F330 Major depressive disorder, recurrent, mild: Secondary | ICD-10-CM | POA: Diagnosis not present

## 2016-04-20 DIAGNOSIS — M1711 Unilateral primary osteoarthritis, right knee: Secondary | ICD-10-CM | POA: Diagnosis not present

## 2016-04-20 DIAGNOSIS — M21061 Valgus deformity, not elsewhere classified, right knee: Secondary | ICD-10-CM | POA: Diagnosis not present

## 2016-04-26 DIAGNOSIS — Z79899 Other long term (current) drug therapy: Secondary | ICD-10-CM | POA: Diagnosis not present

## 2016-04-26 DIAGNOSIS — K219 Gastro-esophageal reflux disease without esophagitis: Secondary | ICD-10-CM | POA: Diagnosis not present

## 2016-04-26 DIAGNOSIS — I4510 Unspecified right bundle-branch block: Secondary | ICD-10-CM | POA: Diagnosis not present

## 2016-04-26 DIAGNOSIS — M25561 Pain in right knee: Secondary | ICD-10-CM | POA: Diagnosis not present

## 2016-04-26 DIAGNOSIS — E039 Hypothyroidism, unspecified: Secondary | ICD-10-CM | POA: Diagnosis not present

## 2016-04-26 DIAGNOSIS — Z01818 Encounter for other preprocedural examination: Secondary | ICD-10-CM | POA: Diagnosis not present

## 2016-04-26 DIAGNOSIS — F330 Major depressive disorder, recurrent, mild: Secondary | ICD-10-CM | POA: Diagnosis not present

## 2016-04-26 DIAGNOSIS — G8929 Other chronic pain: Secondary | ICD-10-CM | POA: Diagnosis not present

## 2016-04-27 DIAGNOSIS — I4510 Unspecified right bundle-branch block: Secondary | ICD-10-CM | POA: Diagnosis not present

## 2016-05-09 DIAGNOSIS — F330 Major depressive disorder, recurrent, mild: Secondary | ICD-10-CM | POA: Diagnosis not present

## 2016-05-09 DIAGNOSIS — G8929 Other chronic pain: Secondary | ICD-10-CM | POA: Diagnosis not present

## 2016-05-09 DIAGNOSIS — M25561 Pain in right knee: Secondary | ICD-10-CM | POA: Diagnosis not present

## 2016-05-11 ENCOUNTER — Other Ambulatory Visit: Payer: Self-pay | Admitting: Orthopedic Surgery

## 2016-05-29 DIAGNOSIS — S93422A Sprain of deltoid ligament of left ankle, initial encounter: Secondary | ICD-10-CM | POA: Diagnosis not present

## 2016-05-31 DIAGNOSIS — H353131 Nonexudative age-related macular degeneration, bilateral, early dry stage: Secondary | ICD-10-CM | POA: Diagnosis not present

## 2016-06-01 ENCOUNTER — Encounter (HOSPITAL_COMMUNITY): Payer: Self-pay

## 2016-06-01 ENCOUNTER — Encounter (HOSPITAL_COMMUNITY)
Admission: RE | Admit: 2016-06-01 | Discharge: 2016-06-01 | Disposition: A | Discharge status: Home / Self Care | Payer: PPO | Source: Ambulatory Visit | Attending: Orthopedic Surgery | Admitting: Orthopedic Surgery

## 2016-06-01 DIAGNOSIS — M1711 Unilateral primary osteoarthritis, right knee: Secondary | ICD-10-CM | POA: Insufficient documentation

## 2016-06-01 DIAGNOSIS — Z01818 Encounter for other preprocedural examination: Secondary | ICD-10-CM | POA: Insufficient documentation

## 2016-06-01 HISTORY — DX: Presence of spectacles and contact lenses: Z97.3

## 2016-06-01 HISTORY — DX: Unspecified osteoarthritis, unspecified site: M19.90

## 2016-06-01 LAB — CBC WITH DIFFERENTIAL/PLATELET
BASOS PCT: 0 %
Basophils Absolute: 0 10*3/uL (ref 0.0–0.1)
Eosinophils Absolute: 0.1 10*3/uL (ref 0.0–0.7)
Eosinophils Relative: 1 %
HEMATOCRIT: 46.7 % — AB (ref 36.0–46.0)
Hemoglobin: 16.1 g/dL — ABNORMAL HIGH (ref 12.0–15.0)
LYMPHS ABS: 2 10*3/uL (ref 0.7–4.0)
LYMPHS PCT: 22 %
MCH: 30 pg (ref 26.0–34.0)
MCHC: 34.5 g/dL (ref 30.0–36.0)
MCV: 87.1 fL (ref 78.0–100.0)
MONO ABS: 0.7 10*3/uL (ref 0.1–1.0)
MONOS PCT: 8 %
NEUTROS ABS: 6.5 10*3/uL (ref 1.7–7.7)
NEUTROS PCT: 69 %
Platelets: 214 10*3/uL (ref 150–400)
RBC: 5.36 MIL/uL — ABNORMAL HIGH (ref 3.87–5.11)
RDW: 12.5 % (ref 11.5–15.5)
WBC: 9.3 10*3/uL (ref 4.0–10.5)

## 2016-06-01 LAB — COMPREHENSIVE METABOLIC PANEL
ALT: 35 U/L (ref 14–54)
AST: 25 U/L (ref 15–41)
Albumin: 3.9 g/dL (ref 3.5–5.0)
Alkaline Phosphatase: 53 U/L (ref 38–126)
Anion gap: 7 (ref 5–15)
BUN: 19 mg/dL (ref 6–20)
CO2: 23 mmol/L (ref 22–32)
Calcium: 9.4 mg/dL (ref 8.9–10.3)
Chloride: 106 mmol/L (ref 101–111)
Creatinine, Ser: 0.72 mg/dL (ref 0.44–1.00)
GFR calc Af Amer: >60 mL/min (ref >60)
GFR calc non Af Amer: >60 mL/min (ref >60)
Glucose, Bld: 101 mg/dL — ABNORMAL HIGH (ref 65–99)
Potassium: 4 mmol/L (ref 3.5–5.1)
Sodium: 136 mmol/L (ref 135–145)
Total Bilirubin: 0.7 mg/dL (ref 0.3–1.2)
Total Protein: 7.1 g/dL (ref 6.5–8.1)

## 2016-06-01 LAB — SURGICAL PCR SCREEN
MRSA, PCR: NEGATIVE
Staphylococcus aureus: NEGATIVE

## 2016-06-01 NOTE — Pre-Procedure Instructions (Signed)
    Molly Ferrell  06/01/2016      [facility] - [place], [place] [address] [place] [address] Phone: [phone] Fax: [phone]    Your procedure is scheduled on Monday, June 12, 2016  Report to [facility] Admitting at 7:15 A.M.  Call this number if you have problems the morning of surgery:  [phone]   Remember:  Do not eat food or drink liquids after midnight Sunday, June 11, 2016  Take these medicines the morning of surgery with A SIP OF WATER : levothyroxine (SYNTHROID) liothyronine (CYTOMEL), omeprazole (PRILOSEC), if needed: Refresh eye drops  Stop taking Aspirin, vitamins, fish oil, Lutein, Niacin, and herbal medications  . Do not take any NSAIDs ie: Ibuprofen, Advil, Naproxen, BC and Goody Powder or any medication containing Aspirin; stop Monday, June 05, 2016  Do not wear jewelry, make-up or nail polish.  Do not wear lotions, powders, or perfumes, or deoderant.  Do not shave 48 hours prior to surgery.    Do not bring valuables to the hospital.  [facility] is not responsible for any belongings or valuables.  Contacts, dentures or bridgework may not be worn into surgery.  Leave your suitcase in the car.  After surgery it may be brought to your room. For patients admitted to the hospital, discharge time will be determined by your treatment team. Special instructions: Shower the night before surgery and the morning of surgery with CHG. Please read over the following fact sheets that you were given. Pain Booklet, Coughing and Deep Breathing, MRSA Information and Surgical Site Infection Prevention

## 2016-06-01 NOTE — Progress Notes (Signed)
Pt denies SOB, chest pain, and being under the care of a cardiologist. Pt denies having a cardiac cath. Pt stated that she will discontinue her hormone therapy today as advised. Spoke with Carlyon Shadow, PA, to clarify pt pre-op Aspirin instructions; pt is to stop aspirin 7 days prior to procedure per PA. Pt denies having a chest x ray within the last year. Pt denies having recent labs. Pt chart forwarded to anesthesia.

## 2016-06-05 NOTE — Progress Notes (Signed)
Anesthesia Chart Review: Patient is a 66 year old female scheduled for right TKR on 06/12/16 by Dr. Ronnie Derby. She goes by "Fraser Din."  History includes hperlipidemia, hypothyroidism, GERD, hard of hearing, former smoker, left TKR revision 06/14/15, hysterectomy, tonsillectomy, appendectomy. BMI 24.  PCP is Dr. Gilford Rile at [facility]). She was seen on 04/26/16 by Alveria Apley, FNP for preoperative clearance with EKG. She wrote, "From a medical standpoint, I feel that she is low risk for complications with surgery. I do advise her to hold her estrogen therapy for at least 2 weeks before surgery and after surgery. She understands that this can increase the risk of thrombosis. I would recommend that she has DVT chemo prophylaxis, as well as early ambulation postop and pulmonary toilet. If she is doing poorly postoperatively she is best to have inpatient hospitalist consult."  Medications include ASA 81 mg (to hold 7 days prior to surgery), Wellbutrin XL, levothyroxine, liothyronine, niacin, fish oil, Prilosec.  BP 124/75   Pulse 76   Temp 36.4 C   Resp 18   Ht 5' 0.5" (1.537 m)   Wt 125 lb 6.4 oz (56.9 kg)   SpO2 95%   BMI 24.09 kg/m   EKG 04/26/16 [facility]): NSR. RBBB. Known right BBB since at least 06/03/15.  Nuclear stress test 07/03/14 [facility]; Scanned under Media tab, Correspondence, 06/14/15): Impression: 1. No reversible ischemia or infarction 2. Normal LV wall motion 3. EF 58% 4. Low risk stress test findings  CTA chest/abd/pelvis 07/03/14 [facility]; Scanned under Media tab, Correspondence, 06/14/15): Impression:   1. No acute vascular abnormality of the chest/abdomen/pelvis, with minimal atherosclerotic changes. 2. Left main and LAD CAD.   Stress echo 11/23/13 [facility]; Scanned under Media tab, Correspondence, 06/14/15): Conclusions: No evidence of inducible ischemia to achieved workload.  Preoperative labs noted.   EKG is  stable. She denied CP and SOB at PAT. Recent medical evaluation for pre-operative clearance. If no acute changes then I would anticipate that she could proceed as planned.  [facility] Short Stay Center/Anesthesiology Phone [phone] 06/05/2016 3:55 PM

## 2016-06-09 MED ORDER — CLINDAMYCIN PHOSPHATE 900 MG/50ML IV SOLN
900.0000 mg | INTRAVENOUS | Status: AC
  Administered 2016-06-12: 900 mg via INTRAVENOUS
  Filled 2016-06-09: qty 50

## 2016-06-09 MED ORDER — DEXAMETHASONE SODIUM PHOSPHATE 10 MG/ML IJ SOLN
8.0000 mg | INTRAMUSCULAR | Status: AC
  Administered 2016-06-12: 8 mg via INTRAVENOUS
  Filled 2016-06-09: qty 1

## 2016-06-09 MED ORDER — ACETAMINOPHEN 500 MG PO TABS
1000.0000 mg | ORAL_TABLET | ORAL | Status: AC
  Administered 2016-06-12: 1000 mg via ORAL
  Filled 2016-06-09: qty 2

## 2016-06-09 MED ORDER — GABAPENTIN 300 MG PO CAPS
300.0000 mg | ORAL_CAPSULE | ORAL | Status: AC
  Administered 2016-06-12: 300 mg via ORAL
  Filled 2016-06-09: qty 1

## 2016-06-09 MED ORDER — TRANEXAMIC ACID 1000 MG/10ML IV SOLN
1000.0000 mg | INTRAVENOUS | Status: AC
  Administered 2016-06-12: 1000 mg via INTRAVENOUS
  Filled 2016-06-09: qty 10

## 2016-06-11 NOTE — H&P (Signed)
Molly Ferrell MRN:  [id number] DOB/SEX:  04-06-1950/female  CHIEF COMPLAINT:  Painful right Knee  HISTORY: Patient is a 66 y.o. female presented with a history of pain in the right knee. Onset of symptoms was gradual starting a few years ago with gradually worsening course since that time. Patient has been treated conservatively with over-the-counter NSAIDs and activity modification. Patient currently rates pain in the knee at 10 out of 10 with activity. There is pain at night.  PAST MEDICAL HISTORY: Patient Active Problem List   Diagnosis Date Noted  . S/P revision of total knee 06/14/2015   Past Medical History:  Diagnosis Date  . Arthritis   . Complication of anesthesia    "many years ago, had problems waking up"  . GERD (gastroesophageal reflux disease)   . Hard of hearing   . History of bronchitis   . Hypercholesteremia   . Hypertriglyceridemia   . Hypothyroidism   . Macular degeneration of both eyes   . OA (osteoarthritis)    severe right knee  . Wears glasses    Past Surgical History:  Procedure Laterality Date  . ABDOMINAL HYSTERECTOMY    . APPENDECTOMY    . COLONOSCOPY    . ESOPHAGOGASTRODUODENOSCOPY    . KNEE ARTHROPLASTY Left   . TONSILLECTOMY    . TOTAL KNEE REVISION Left 06/14/2015   Procedure: TOTAL KNEE REVISION POLY EXCHANGE;  Surgeon: Vickey Huger, MD;  Location: [facility];  Service: Orthopedics;  Laterality: Left;     MEDICATIONS:   No prescriptions prior to admission.    ALLERGIES:   Allergies  Allergen Reactions  . Penicillins Hives and Itching    CHILDHOOD reaction to Pen G Has patient had a PCN reaction causing immediate rash, facial/tongue/throat swelling, SOB or lightheadedness with hypotension: No Has patient had a PCN reaction causing severe rash involving mucus membranes or skin necrosis: No Has patient had a PCN reaction that required hospitalization: No Has patient had a PCN reaction occurring within the last 10 years: No If all of the  above answers are "NO", then may proceed with Cephalosporin use.     REVIEW OF SYSTEMS:  A comprehensive review of systems was negative except for: Musculoskeletal: positive for arthralgias and bone pain   FAMILY HISTORY:   Family History  Problem Relation Age of Onset  . Heart disease Mother   . Heart disease Father     SOCIAL HISTORY:   Social History  Substance Use Topics  . Smoking status: Former Research scientist (life sciences)  . Smokeless tobacco: Never Used     Comment: "quit 10-15 years ago" 06/03/15  . Alcohol use 0.6 oz/week    1 Glasses of wine per week     Comment: weekends     EXAMINATION:  Vital signs in last 24 hours:    There were no vitals taken for this visit.  General Appearance:    Alert, cooperative, no distress, appears stated age  Head:    Normocephalic, without obvious abnormality, atraumatic  Eyes:    PERRL, conjunctiva/corneas clear, EOM's intact, fundi    benign, both eyes  Ears:    Normal TM's and external ear canals, both ears  Nose:   Nares normal, septum midline, mucosa normal, no drainage    or sinus tenderness  Throat:   Lips, mucosa, and tongue normal; teeth and gums normal  Neck:   Supple, symmetrical, trachea midline, no adenopathy;    thyroid:  no enlargement/tenderness/nodules; no carotid   bruit or JVD  Back:  Symmetric, no curvature, ROM normal, no CVA tenderness  Lungs:     Clear to auscultation bilaterally, respirations unlabored  Chest Wall:    No tenderness or deformity   Heart:    Regular rate and rhythm, S1 and S2 normal, no murmur, rub   or gallop  Breast Exam:    No tenderness, masses, or nipple abnormality  Abdomen:     Soft, non-tender, bowel sounds active all four quadrants,    no masses, no organomegaly  Genitalia:    Normal female without lesion, discharge or tenderness  Rectal:    Normal tone, no masses or tenderness;   guaiac negative stool  Extremities:   Extremities normal, atraumatic, no cyanosis or edema  Pulses:   2+ and  symmetric all extremities  Skin:   Skin color, texture, turgor normal, no rashes or lesions  Lymph nodes:   Cervical, supraclavicular, and axillary nodes normal  Neurologic:   CNII-XII intact, normal strength, sensation and reflexes    throughout    Musculoskeletal:  ROM 0-120, Ligaments intact,  Imaging Review Plain radiographs demonstrate severe degenerative joint disease of the right knee. The overall alignment is neutral. The bone quality appears to be excellent for age and reported activity level.  Assessment/Plan: Primary osteoarthritis, right knee   The patient history, physical examination and imaging studies are consistent with advanced degenerative joint disease of the right knee. The patient has failed conservative treatment.  The clearance notes were reviewed.  After discussion with the patient it was felt that Total Knee Replacement was indicated. The procedure,  risks, and benefits of total knee arthroplasty were presented and reviewed. The risks including but not limited to aseptic loosening, infection, blood clots, vascular injury, stiffness, patella tracking problems complications among others were discussed. The patient acknowledged the explanation, agreed to proceed with the plan.  Donia Ast 06/11/2016, 8:47 PM

## 2016-06-12 ENCOUNTER — Observation Stay (HOSPITAL_COMMUNITY)
Admission: RE | Admit: 2016-06-12 | Discharge: 2016-06-13 | Disposition: A | Discharge status: Home / Self Care | Payer: PPO | Source: Ambulatory Visit | Attending: Orthopedic Surgery | Admitting: Orthopedic Surgery

## 2016-06-12 ENCOUNTER — Encounter (HOSPITAL_COMMUNITY)
Admission: RE | Disposition: A | Discharge status: Home / Self Care | Payer: Self-pay | Source: Ambulatory Visit | Attending: Orthopedic Surgery

## 2016-06-12 ENCOUNTER — Ambulatory Visit (HOSPITAL_COMMUNITY): Payer: PPO | Admitting: Anesthesiology

## 2016-06-12 ENCOUNTER — Ambulatory Visit (HOSPITAL_COMMUNITY): Payer: PPO | Admitting: Vascular Surgery

## 2016-06-12 ENCOUNTER — Encounter (HOSPITAL_COMMUNITY): Payer: Self-pay | Admitting: *Deleted

## 2016-06-12 DIAGNOSIS — G8918 Other acute postprocedural pain: Secondary | ICD-10-CM | POA: Diagnosis not present

## 2016-06-12 DIAGNOSIS — Z880 Allergy status to penicillin: Secondary | ICD-10-CM | POA: Insufficient documentation

## 2016-06-12 DIAGNOSIS — E781 Pure hyperglyceridemia: Secondary | ICD-10-CM | POA: Diagnosis not present

## 2016-06-12 DIAGNOSIS — E7800 Pure hypercholesterolemia, unspecified: Secondary | ICD-10-CM | POA: Diagnosis not present

## 2016-06-12 DIAGNOSIS — Z87891 Personal history of nicotine dependence: Secondary | ICD-10-CM | POA: Diagnosis not present

## 2016-06-12 DIAGNOSIS — Z7982 Long term (current) use of aspirin: Secondary | ICD-10-CM | POA: Diagnosis not present

## 2016-06-12 DIAGNOSIS — H3530 Unspecified macular degeneration: Secondary | ICD-10-CM | POA: Insufficient documentation

## 2016-06-12 DIAGNOSIS — Z96659 Presence of unspecified artificial knee joint: Secondary | ICD-10-CM

## 2016-06-12 DIAGNOSIS — E039 Hypothyroidism, unspecified: Secondary | ICD-10-CM | POA: Insufficient documentation

## 2016-06-12 DIAGNOSIS — M1711 Unilateral primary osteoarthritis, right knee: Secondary | ICD-10-CM | POA: Diagnosis not present

## 2016-06-12 DIAGNOSIS — K219 Gastro-esophageal reflux disease without esophagitis: Secondary | ICD-10-CM | POA: Diagnosis not present

## 2016-06-12 HISTORY — PX: TOTAL KNEE ARTHROPLASTY: SHX125

## 2016-06-12 SURGERY — ARTHROPLASTY, KNEE, TOTAL
Anesthesia: Spinal | Laterality: Right

## 2016-06-12 MED ORDER — PROPOFOL 500 MG/50ML IV EMUL
INTRAVENOUS | Status: DC | PRN
  Administered 2016-06-12: 50 ug/kg/min via INTRAVENOUS

## 2016-06-12 MED ORDER — DEXAMETHASONE SODIUM PHOSPHATE 10 MG/ML IJ SOLN
10.0000 mg | Freq: Once | INTRAMUSCULAR | Status: DC
  Filled 2016-06-12: qty 1

## 2016-06-12 MED ORDER — ASPIRIN EC 325 MG PO TBEC
325.0000 mg | DELAYED_RELEASE_TABLET | Freq: Two times a day (BID) | ORAL | Status: DC
  Administered 2016-06-12 – 2016-06-13 (×2): 325 mg via ORAL
  Filled 2016-06-12 (×2): qty 1

## 2016-06-12 MED ORDER — HYDROCODONE-ACETAMINOPHEN 7.5-325 MG PO TABS
1.0000 | ORAL_TABLET | Freq: Four times a day (QID) | ORAL | Status: DC
  Administered 2016-06-12 – 2016-06-13 (×4): 1 via ORAL
  Filled 2016-06-12 (×4): qty 1

## 2016-06-12 MED ORDER — DIPHENHYDRAMINE HCL 12.5 MG/5ML PO ELIX: 12.5000 mg | ORAL_SOLUTION | ORAL | Status: DC | PRN

## 2016-06-12 MED ORDER — MIDAZOLAM HCL 2 MG/2ML IJ SOLN
INTRAMUSCULAR | Status: DC | PRN
  Administered 2016-06-12: 2 mg via INTRAVENOUS

## 2016-06-12 MED ORDER — OXYCODONE HCL 5 MG PO TABS
5.0000 mg | ORAL_TABLET | ORAL | Status: DC | PRN
  Administered 2016-06-12 (×2): 10 mg via ORAL
  Filled 2016-06-12 (×2): qty 2

## 2016-06-12 MED ORDER — ONDANSETRON HCL 4 MG PO TABS: 4.0000 mg | ORAL_TABLET | Freq: Four times a day (QID) | ORAL | Status: DC | PRN

## 2016-06-12 MED ORDER — METOCLOPRAMIDE HCL 5 MG/ML IJ SOLN: 5.0000 mg | Freq: Three times a day (TID) | INTRAMUSCULAR | Status: DC | PRN

## 2016-06-12 MED ORDER — FENTANYL CITRATE (PF) 250 MCG/5ML IJ SOLN
INTRAMUSCULAR | Status: DC | PRN
  Administered 2016-06-12: 50 ug via INTRAVENOUS

## 2016-06-12 MED ORDER — PHENOL 1.4 % MT LIQD: 1.0000 | OROMUCOSAL | Status: DC | PRN

## 2016-06-12 MED ORDER — FENTANYL CITRATE (PF) 100 MCG/2ML IJ SOLN
INTRAMUSCULAR | Status: AC
  Administered 2016-06-12: 50 ug via INTRAVENOUS
  Filled 2016-06-12: qty 2

## 2016-06-12 MED ORDER — FENTANYL CITRATE (PF) 100 MCG/2ML IJ SOLN: 25.0000 ug | INTRAMUSCULAR | Status: DC | PRN

## 2016-06-12 MED ORDER — LEVOTHYROXINE SODIUM 88 MCG PO TABS
88.0000 ug | ORAL_TABLET | Freq: Every day | ORAL | Status: DC
  Administered 2016-06-13: 88 ug via ORAL
  Filled 2016-06-12: qty 1

## 2016-06-12 MED ORDER — SODIUM CHLORIDE 0.9 % IR SOLN
Status: DC | PRN
  Administered 2016-06-12: 3000 mL

## 2016-06-12 MED ORDER — PANTOPRAZOLE SODIUM 40 MG PO TBEC
80.0000 mg | DELAYED_RELEASE_TABLET | Freq: Every day | ORAL | Status: DC
  Administered 2016-06-13: 80 mg via ORAL
  Filled 2016-06-12: qty 2

## 2016-06-12 MED ORDER — FLEET ENEMA 7-19 GM/118ML RE ENEM: 1.0000 | ENEMA | Freq: Once | RECTAL | Status: DC | PRN

## 2016-06-12 MED ORDER — FENTANYL CITRATE (PF) 100 MCG/2ML IJ SOLN
50.0000 ug | Freq: Once | INTRAMUSCULAR | Status: AC
  Administered 2016-06-12: 50 ug via INTRAVENOUS

## 2016-06-12 MED ORDER — CELECOXIB 200 MG PO CAPS
200.0000 mg | ORAL_CAPSULE | Freq: Two times a day (BID) | ORAL | Status: DC
  Administered 2016-06-12 – 2016-06-13 (×2): 200 mg via ORAL
  Filled 2016-06-12 (×2): qty 1

## 2016-06-12 MED ORDER — SODIUM CHLORIDE 0.9 % IJ SOLN
INTRAMUSCULAR | Status: DC | PRN
  Administered 2016-06-12: 20 mL via INTRAVENOUS

## 2016-06-12 MED ORDER — ONDANSETRON HCL 4 MG/2ML IJ SOLN: 4.0000 mg | Freq: Four times a day (QID) | INTRAMUSCULAR | Status: DC | PRN

## 2016-06-12 MED ORDER — BUPIVACAINE-EPINEPHRINE (PF) 0.5% -1:200000 IJ SOLN
INTRAMUSCULAR | Status: DC | PRN
  Administered 2016-06-12: 25 mL via PERINEURAL

## 2016-06-12 MED ORDER — OXYCODONE HCL 5 MG PO TABS: 5.0000 mg | ORAL_TABLET | Freq: Once | ORAL | Status: DC | PRN

## 2016-06-12 MED ORDER — ACETAMINOPHEN 650 MG RE SUPP
650.0000 mg | Freq: Four times a day (QID) | RECTAL | Status: DC | PRN
Start: 2016-06-12 — End: 2016-06-13

## 2016-06-12 MED ORDER — CLINDAMYCIN PHOSPHATE 600 MG/50ML IV SOLN
600.0000 mg | Freq: Four times a day (QID) | INTRAVENOUS | Status: AC
  Administered 2016-06-12 (×2): 600 mg via INTRAVENOUS
  Filled 2016-06-12 (×2): qty 50

## 2016-06-12 MED ORDER — BUPROPION HCL ER (XL) 150 MG PO TB24
150.0000 mg | ORAL_TABLET | Freq: Every day | ORAL | Status: DC
  Administered 2016-06-12: 150 mg via ORAL
  Filled 2016-06-12: qty 1

## 2016-06-12 MED ORDER — MIDAZOLAM HCL 2 MG/2ML IJ SOLN
1.0000 mg | Freq: Once | INTRAMUSCULAR | Status: AC
  Administered 2016-06-12: 1 mg via INTRAVENOUS

## 2016-06-12 MED ORDER — FENTANYL CITRATE (PF) 250 MCG/5ML IJ SOLN
INTRAMUSCULAR | Status: AC
  Filled 2016-06-12: qty 5

## 2016-06-12 MED ORDER — MENTHOL 3 MG MT LOZG: 1.0000 | LOZENGE | OROMUCOSAL | Status: DC | PRN

## 2016-06-12 MED ORDER — LIOTHYRONINE SODIUM 25 MCG PO TABS
25.0000 ug | ORAL_TABLET | Freq: Every day | ORAL | Status: DC
  Administered 2016-06-13: 25 ug via ORAL
  Filled 2016-06-12: qty 1

## 2016-06-12 MED ORDER — ALUM & MAG HYDROXIDE-SIMETH 200-200-20 MG/5ML PO SUSP: 30.0000 mL | ORAL | Status: DC | PRN

## 2016-06-12 MED ORDER — BUPIVACAINE LIPOSOME 1.3 % IJ SUSP
INTRAMUSCULAR | Status: DC | PRN
  Administered 2016-06-12: 20 mL

## 2016-06-12 MED ORDER — BUPIVACAINE LIPOSOME 1.3 % IJ SUSP
20.0000 mL | Freq: Once | INTRAMUSCULAR | Status: DC
  Filled 2016-06-12: qty 20

## 2016-06-12 MED ORDER — MIDAZOLAM HCL 2 MG/2ML IJ SOLN
INTRAMUSCULAR | Status: AC
  Administered 2016-06-12: 1 mg via INTRAVENOUS
  Filled 2016-06-12: qty 2

## 2016-06-12 MED ORDER — OXYCODONE HCL 5 MG/5ML PO SOLN: 5.0000 mg | Freq: Once | ORAL | Status: DC | PRN

## 2016-06-12 MED ORDER — EPHEDRINE SULFATE-NACL 50-0.9 MG/10ML-% IV SOSY
PREFILLED_SYRINGE | INTRAVENOUS | Status: DC | PRN
  Administered 2016-06-12 (×3): 10 mg via INTRAVENOUS

## 2016-06-12 MED ORDER — BUPIVACAINE HCL (PF) 0.25 % IJ SOLN
INTRAMUSCULAR | Status: AC
  Filled 2016-06-12: qty 30

## 2016-06-12 MED ORDER — DOCUSATE SODIUM 100 MG PO CAPS
100.0000 mg | ORAL_CAPSULE | Freq: Two times a day (BID) | ORAL | Status: DC
  Administered 2016-06-12 – 2016-06-13 (×2): 100 mg via ORAL
  Filled 2016-06-12 (×2): qty 1

## 2016-06-12 MED ORDER — METHOCARBAMOL 500 MG PO TABS
500.0000 mg | ORAL_TABLET | Freq: Four times a day (QID) | ORAL | Status: DC | PRN
  Administered 2016-06-12: 500 mg via ORAL
  Filled 2016-06-12: qty 1

## 2016-06-12 MED ORDER — LACTATED RINGERS IV SOLN
INTRAVENOUS | Status: DC
  Administered 2016-06-12 (×3): via INTRAVENOUS

## 2016-06-12 MED ORDER — GABAPENTIN 300 MG PO CAPS
300.0000 mg | ORAL_CAPSULE | Freq: Three times a day (TID) | ORAL | Status: DC
  Administered 2016-06-12 – 2016-06-13 (×3): 300 mg via ORAL
  Filled 2016-06-12 (×4): qty 1

## 2016-06-12 MED ORDER — ZOLPIDEM TARTRATE 5 MG PO TABS: 5.0000 mg | ORAL_TABLET | Freq: Every evening | ORAL | Status: DC | PRN

## 2016-06-12 MED ORDER — MIDAZOLAM HCL 2 MG/2ML IJ SOLN
INTRAMUSCULAR | Status: AC
  Filled 2016-06-12: qty 2

## 2016-06-12 MED ORDER — HYDROMORPHONE HCL 1 MG/ML IJ SOLN: 1.0000 mg | INTRAMUSCULAR | Status: DC | PRN

## 2016-06-12 MED ORDER — METOCLOPRAMIDE HCL 5 MG PO TABS: 5.0000 mg | ORAL_TABLET | Freq: Three times a day (TID) | ORAL | Status: DC | PRN

## 2016-06-12 MED ORDER — TRANEXAMIC ACID 1000 MG/10ML IV SOLN
1000.0000 mg | Freq: Once | INTRAVENOUS | Status: AC
  Administered 2016-06-12: 1000 mg via INTRAVENOUS
  Filled 2016-06-12: qty 10

## 2016-06-12 MED ORDER — DEXTROSE 5 % IV SOLN
500.0000 mg | Freq: Four times a day (QID) | INTRAVENOUS | Status: DC | PRN
  Filled 2016-06-12: qty 5

## 2016-06-12 MED ORDER — BUPIVACAINE HCL (PF) 0.5 % IJ SOLN
INTRAMUSCULAR | Status: DC | PRN
  Administered 2016-06-12: 3 mL via INTRATHECAL

## 2016-06-12 MED ORDER — BISACODYL 5 MG PO TBEC: 5.0000 mg | DELAYED_RELEASE_TABLET | Freq: Every day | ORAL | Status: DC | PRN

## 2016-06-12 MED ORDER — 0.9 % SODIUM CHLORIDE (POUR BTL) OPTIME
TOPICAL | Status: DC | PRN
  Administered 2016-06-12: 1000 mL

## 2016-06-12 MED ORDER — SENNOSIDES-DOCUSATE SODIUM 8.6-50 MG PO TABS: 1.0000 | ORAL_TABLET | Freq: Every evening | ORAL | Status: DC | PRN

## 2016-06-12 MED ORDER — BUPIVACAINE-EPINEPHRINE (PF) 0.25% -1:200000 IJ SOLN
INTRAMUSCULAR | Status: DC | PRN
  Administered 2016-06-12: 30 mL via PERINEURAL

## 2016-06-12 MED ORDER — ACETAMINOPHEN 325 MG PO TABS
650.0000 mg | ORAL_TABLET | Freq: Four times a day (QID) | ORAL | Status: DC | PRN
  Administered 2016-06-12: 650 mg via ORAL
  Filled 2016-06-12: qty 2

## 2016-06-12 SURGICAL SUPPLY — 65 items
BANDAGE ACE 6X5 VEL STRL LF (GAUZE/BANDAGES/DRESSINGS) ×3 IMPLANT
BANDAGE ESMARK 6X9 LF (GAUZE/BANDAGES/DRESSINGS) ×1 IMPLANT
BLADE SAGITTAL 13X1.27X60 (BLADE) ×2 IMPLANT
BLADE SAGITTAL 13X1.27X60MM (BLADE) ×1
BLADE SAW SGTL 83.5X18.5 (BLADE) ×3 IMPLANT
BLADE SURG 10 STRL SS (BLADE) ×3 IMPLANT
BNDG ESMARK 6X9 LF (GAUZE/BANDAGES/DRESSINGS) ×3
BOWL SMART MIX CTS (DISPOSABLE) ×3 IMPLANT
CAPT KNEE TOTAL 3 ×3 IMPLANT
CEMENT BONE SIMPLEX SPEEDSET (Cement) ×6 IMPLANT
CLOSURE WOUND 1/2 X4 (GAUZE/BANDAGES/DRESSINGS) ×1
COVER SURGICAL LIGHT HANDLE (MISCELLANEOUS) ×3 IMPLANT
CUFF TOURNIQUET SINGLE 34IN LL (TOURNIQUET CUFF) ×3 IMPLANT
DRAPE EXTREMITY T 121X128X90 (DRAPE) ×3 IMPLANT
DRAPE HALF SHEET 40X57 (DRAPES) ×3 IMPLANT
DRAPE INCISE IOBAN 66X45 STRL (DRAPES) ×6 IMPLANT
DRAPE U-SHAPE 47X51 STRL (DRAPES) ×3 IMPLANT
DRESSING AQUACEL AG SP 3.5X10 (GAUZE/BANDAGES/DRESSINGS) ×1 IMPLANT
DRSG AQUACEL AG ADV 3.5X10 (GAUZE/BANDAGES/DRESSINGS) ×3 IMPLANT
DRSG AQUACEL AG SP 3.5X10 (GAUZE/BANDAGES/DRESSINGS) ×3
DURAPREP 26ML APPLICATOR (WOUND CARE) ×6 IMPLANT
ELECT REM PT RETURN 9FT ADLT (ELECTROSURGICAL) ×3
ELECTRODE REM PT RTRN 9FT ADLT (ELECTROSURGICAL) ×1 IMPLANT
FILTER STRAW FLUID ASPIR (MISCELLANEOUS) IMPLANT
GLOVE BIOGEL M 7.0 STRL (GLOVE) ×3 IMPLANT
GLOVE BIOGEL PI IND STRL 6.5 (GLOVE) ×1 IMPLANT
GLOVE BIOGEL PI IND STRL 7.5 (GLOVE) ×1 IMPLANT
GLOVE BIOGEL PI IND STRL 8.5 (GLOVE) ×1 IMPLANT
GLOVE BIOGEL PI INDICATOR 6.5 (GLOVE) ×2
GLOVE BIOGEL PI INDICATOR 7.5 (GLOVE) ×2
GLOVE BIOGEL PI INDICATOR 8.5 (GLOVE) ×2
GLOVE SURG ORTHO 8.0 STRL STRW (GLOVE) ×6 IMPLANT
GLOVE SURG SS PI 6.5 STRL IVOR (GLOVE) ×3 IMPLANT
GOWN STRL REUS W/ TWL LRG LVL3 (GOWN DISPOSABLE) ×1 IMPLANT
GOWN STRL REUS W/ TWL XL LVL3 (GOWN DISPOSABLE) ×2 IMPLANT
GOWN STRL REUS W/TWL 2XL LVL3 (GOWN DISPOSABLE) ×3 IMPLANT
GOWN STRL REUS W/TWL LRG LVL3 (GOWN DISPOSABLE) ×2
GOWN STRL REUS W/TWL XL LVL3 (GOWN DISPOSABLE) ×4
HANDPIECE INTERPULSE COAX TIP (DISPOSABLE) ×2
HOOD PEEL AWAY FACE SHEILD DIS (HOOD) ×9 IMPLANT
KIT BASIN OR (CUSTOM PROCEDURE TRAY) ×3 IMPLANT
KIT ROOM TURNOVER OR (KITS) ×3 IMPLANT
KNEE CAPITATED TOTAL 3 ×1 IMPLANT
MANIFOLD NEPTUNE II (INSTRUMENTS) ×3 IMPLANT
NEEDLE 18GX1X1/2 (RX/OR ONLY) (NEEDLE) IMPLANT
NEEDLE 22X1 1/2 (OR ONLY) (NEEDLE) ×6 IMPLANT
NS IRRIG 1000ML POUR BTL (IV SOLUTION) ×3 IMPLANT
PACK TOTAL JOINT (CUSTOM PROCEDURE TRAY) ×3 IMPLANT
PAD ARMBOARD 7.5X6 YLW CONV (MISCELLANEOUS) ×6 IMPLANT
SET HNDPC FAN SPRY TIP SCT (DISPOSABLE) ×1 IMPLANT
STRIP CLOSURE SKIN 1/2X4 (GAUZE/BANDAGES/DRESSINGS) ×2 IMPLANT
SUCTION FRAZIER HANDLE 10FR (MISCELLANEOUS)
SUCTION TUBE FRAZIER 10FR DISP (MISCELLANEOUS) IMPLANT
SUT MNCRL AB 3-0 PS2 18 (SUTURE) ×3 IMPLANT
SUT VIC AB 0 CTB1 27 (SUTURE) ×6 IMPLANT
SUT VIC AB 1 CT1 27 (SUTURE) ×4
SUT VIC AB 1 CT1 27XBRD ANBCTR (SUTURE) ×2 IMPLANT
SUT VIC AB 2-0 CT1 27 (SUTURE) ×4
SUT VIC AB 2-0 CT1 TAPERPNT 27 (SUTURE) ×2 IMPLANT
SYR 20CC LL (SYRINGE) ×6 IMPLANT
SYR TB 1ML LUER SLIP (SYRINGE) IMPLANT
TOWEL OR 17X24 6PK STRL BLUE (TOWEL DISPOSABLE) ×3 IMPLANT
TOWEL OR 17X26 10 PK STRL BLUE (TOWEL DISPOSABLE) ×3 IMPLANT
TRAY CATH 16FR W/PLASTIC CATH (SET/KITS/TRAYS/PACK) IMPLANT
WRAP KNEE MAXI GEL POST OP (GAUZE/BANDAGES/DRESSINGS) ×3 IMPLANT

## 2016-06-12 NOTE — Progress Notes (Signed)
Orthopedic Tech Progress Note Patient Details:  Molly Ferrell Aug 23, 1950 [id number]  CPM Right Knee CPM Right Knee: On Right Knee Flexion (Degrees): 90 Right Knee Extension (Degrees): 0 Additional Comments: Applied CPM to pt right Knee/let at 0-90.  Pt tolerated well.  Right Leg/knee   Kristopher Oppenheim 06/12/2016, 1:07 PM

## 2016-06-12 NOTE — Anesthesia Postprocedure Evaluation (Signed)
Anesthesia Post Note  Patient: Molly Ferrell  Procedure(s) Performed: Procedure(s) (LRB): TOTAL KNEE ARTHROPLASTY (Right)  Patient location during evaluation: PACU Anesthesia Type: Spinal Level of consciousness: oriented and awake and alert Pain management: pain level controlled Vital Signs Assessment: post-procedure vital signs reviewed and stable Respiratory status: spontaneous breathing, respiratory function stable and patient connected to nasal cannula oxygen Cardiovascular status: blood pressure returned to baseline and stable Postop Assessment: no headache and no backache Anesthetic complications: no       Last Vitals:  Vitals:   06/12/16 1245 06/12/16 1300  BP: 120/63 106/62  Pulse: 72 77  Resp: 18 20  Temp:      Last Pain: There were no vitals filed for this visit.               [place]

## 2016-06-12 NOTE — Anesthesia Procedure Notes (Signed)
Anesthesia Regional Block: Adductor canal block   Pre-Anesthetic Checklist: ,, timeout performed, Correct Patient, Correct Site, Correct Laterality, Correct Procedure, Correct Position, site marked, Risks and benefits discussed,  Surgical consent,  Pre-op evaluation,  At surgeon's request and post-op pain management  Laterality: Right  Prep: chloraprep       Needles:  Injection technique: Single-shot  Needle Type: Echogenic Needle     Needle Length: 9cm  Needle Gauge: 21     Additional Needles:   Procedures: ultrasound guided,,,,,,,,  Narrative:  Start time: 06/12/2016 8:52 AM End time: 06/12/2016 8:58 AM Injection made incrementally with aspirations every 5 mL.  Performed by: Personally  Anesthesiologist: Johnda Billiot  Additional Notes: Pt tolerated the procedure well.

## 2016-06-12 NOTE — Transfer of Care (Signed)
Immediate Anesthesia Transfer of Care Note  Patient: Molly Ferrell  Procedure(s) Performed: Procedure(s): TOTAL KNEE ARTHROPLASTY (Right)  Patient Location: PACU  Anesthesia Type:Regional and Spinal  Level of Consciousness: awake, alert  and oriented  Airway & Oxygen Therapy: Patient Spontanous Breathing and Patient connected to nasal cannula oxygen  Post-op Assessment: Report given to RN and Post -op Vital signs reviewed and stable  Post vital signs: Reviewed and stable  Last Vitals:  Vitals:   06/12/16 0730  BP: 130/70  Pulse: 60  Resp: (!) 22  Temp: 36.6 C    Last Pain: There were no vitals filed for this visit.    Patients Stated Pain Goal: 2 (23/41/44 3601)  Complications: No apparent anesthesia complications

## 2016-06-12 NOTE — Anesthesia Procedure Notes (Signed)
Spinal  Patient location during procedure: OR Start time: 06/12/2016 10:01 AM End time: 06/12/2016 10:05 AM Staffing Anesthesiologist: Marcie Bal, Lynnda Wiersma Performed: anesthesiologist  Preanesthetic Checklist Completed: patient identified, surgical consent, pre-op evaluation, timeout performed, IV checked, risks and benefits discussed and monitors and equipment checked Spinal Block Patient position: sitting Prep: DuraPrep Patient monitoring: cardiac monitor, continuous pulse ox and blood pressure Approach: midline Location: L3-4 Injection technique: single-shot Needle Needle type: Pencan  Needle gauge: 24 G Needle length: 9 cm Assessment Sensory level: T10 Additional Notes Functioning IV was confirmed and monitors were applied. Sterile prep and drape, including hand hygiene and sterile gloves were used. The patient was positioned and the spine was prepped. The skin was anesthetized with lidocaine.  Free flow of clear CSF was obtained prior to injecting local anesthetic into the CSF.  The spinal needle aspirated freely following injection.  The needle was carefully withdrawn.  The patient tolerated the procedure well.

## 2016-06-12 NOTE — Anesthesia Preprocedure Evaluation (Signed)
Anesthesia Evaluation  Patient identified by MRN, date of birth, ID band Patient awake    Reviewed: Allergy & Precautions, H&P , NPO status , Patient's Chart, lab work & pertinent test results  Airway Mallampati: II   Neck ROM: full    Dental   Pulmonary former smoker,    breath sounds clear to auscultation       Cardiovascular negative cardio ROS   Rhythm:regular Rate:Normal     Neuro/Psych    GI/Hepatic GERD  ,  Endo/Other  Hypothyroidism   Renal/GU      Musculoskeletal  (+) Arthritis ,   Abdominal   Peds  Hematology   Anesthesia Other Findings   Reproductive/Obstetrics                             Anesthesia Physical Anesthesia Plan  ASA: II  Anesthesia Plan: Spinal   Post-op Pain Management:  Regional for Post-op pain   Induction: Intravenous  Airway Management Planned: Simple Face Mask  Additional Equipment:   Intra-op Plan:   Post-operative Plan:   Informed Consent: I have reviewed the patients History and Physical, chart, labs and discussed the procedure including the risks, benefits and alternatives for the proposed anesthesia with the patient or authorized representative who has indicated his/her understanding and acceptance.     Plan Discussed with: CRNA, Anesthesiologist and Surgeon  Anesthesia Plan Comments:         Anesthesia Quick Evaluation

## 2016-06-12 NOTE — Evaluation (Addendum)
Physical Therapy Evaluation Patient Details Name: Molly Ferrell MRN: [id number] DOB: [birth year] Today's Date: 06/12/2016   History of Present Illness  Admitted for RTKA, WBAT;  has a past medical history of Arthritis; Complication of anesthesia; GERD (gastroesophageal reflux disease); Hard of hearing; Macular degeneration of both eyes; OA (osteoarthritis); and Wears glasses.  has a past surgical history that includes  Knee Arthroplasty (Left);  Total knee revision (Left, 06/14/2015)  Clinical Impression   Pt is s/p TKA resulting in the deficits listed below (see PT Problem List). Doing very well with ROM and ambulation; anticipate dc home tomorrow;  Pt will benefit from skilled PT to increase their independence and safety with mobility to allow discharge to the venue listed below.      Follow Up Recommendations Home health PT    Equipment Recommendations  None recommended by PT    Recommendations for Other Services       Precautions / Restrictions Precautions Precautions: Knee Precaution Comments: Pt educated to not allow any pillow or bolster under knee for healing with optimal range of motion.  Restrictions Weight Bearing Restrictions: Yes RLE Weight Bearing: Weight bearing as tolerated      Mobility  Bed Mobility Overal bed mobility: Needs Assistance Bed Mobility: Supine to Sit     Supine to sit: Supervision     General bed mobility comments: cues for technqiue  Transfers Overall transfer level: Needs assistance   Transfers: Sit to/from Stand Sit to Stand: Supervision         General transfer comment: Cues for hand placement and safety  Ambulation/Gait Ambulation/Gait assistance: Min guard Ambulation Distance (Feet): 100 Feet Assistive device: Rolling walker (2 wheeled) Gait Pattern/deviations: Step-through pattern     General Gait Details: Cues to activate R quad for stance stability  Stairs            Wheelchair Mobility    Modified  Rankin (Stroke Patients Only)       Balance                                             Pertinent Vitals/Pain Pain Assessment: 0-10 Pain Score: 6  Pain Location: R knee Pain Descriptors / Indicators: Aching Pain Intervention(s): Limited activity within patient's tolerance;Monitored during session;Patient requesting pain meds-RN notified    Home Living Family/patient expects to be discharged to:: Private residence Living Arrangements: Spouse/significant other Available Help at Discharge: Family;Available 24 hours/day Type of Home: House Home Access: Level entry     Home Layout:  (4 steps down to TV room, L rail) Home Equipment: Gilford Rile - 2 wheels      Prior Function Level of Independence: Independent               Hand Dominance        Extremity/Trunk Assessment   Upper Extremity Assessment Upper Extremity Assessment: Overall WFL for tasks assessed    Lower Extremity Assessment Lower Extremity Assessment: RLE deficits/detail RLE Deficits / Details: Grossly decr strength postop; good ROM       Communication   Communication: No difficulties  Cognition Arousal/Alertness: Awake/alert Behavior During Therapy: WFL for tasks assessed/performed Overall Cognitive Status: Within Functional Limits for tasks assessed  General Comments      Exercises     Assessment/Plan    PT Assessment Patient needs continued PT services  PT Problem List Decreased strength;Decreased range of motion;Decreased activity tolerance;Decreased balance;Decreased mobility;Decreased knowledge of use of DME;Decreased knowledge of precautions;Pain       PT Treatment Interventions DME instruction;Gait training;Stair training;Functional mobility training;Therapeutic activities;Therapeutic exercise;Patient/family education    PT Goals (Current goals can be found in the Care Plan section)  Acute Rehab PT  Goals Patient Stated Goal: walk without pain PT Goal Formulation: With patient Time For Goal Achievement: 06/19/16 Potential to Achieve Goals: Good    Frequency 7X/week   Barriers to discharge        Co-evaluation               End of Session Equipment Utilized During Treatment: Gait belt Activity Tolerance: Patient tolerated treatment well Patient left: in chair;with call bell/phone within reach Nurse Communication: Mobility status PT Visit Diagnosis: Pain Pain - Right/Left: Right Pain - part of body: Knee    Time: 6045-4098 PT Time Calculation (min) (ACUTE ONLY): 23 min   Charges:   PT Evaluation $PT Eval Low Complexity: 1 Procedure PT Treatments $Gait Training: 8-22 mins   PT G Codes:     09-Jul-2016 1700  PT G-Codes **NOT FOR INPATIENT CLASS**  Functional Assessment Tool Used Clinical judgement  Functional Limitation Mobility: Walking and moving around  Mobility: Walking and Moving Around Current Status (J1914) CI  Mobility: Walking and Moving Around Goal Status (N8295) [place], PT  Acute Rehabilitation Services Pager [phone] Office [phone]   [place] 2016/07/09, 5:08 PM

## 2016-06-13 ENCOUNTER — Encounter (HOSPITAL_COMMUNITY): Payer: Self-pay | Admitting: Orthopedic Surgery

## 2016-06-13 DIAGNOSIS — M1711 Unilateral primary osteoarthritis, right knee: Secondary | ICD-10-CM | POA: Diagnosis not present

## 2016-06-13 LAB — BASIC METABOLIC PANEL
Anion gap: 10 (ref 5–15)
BUN: 11 mg/dL (ref 6–20)
CALCIUM: 8.9 mg/dL (ref 8.9–10.3)
CHLORIDE: 105 mmol/L (ref 101–111)
CO2: 22 mmol/L (ref 22–32)
CREATININE: 0.87 mg/dL (ref 0.44–1.00)
GFR calc non Af Amer: >60 mL/min (ref >60)
Glucose, Bld: 110 mg/dL — ABNORMAL HIGH (ref 65–99)
Potassium: 3.7 mmol/L (ref 3.5–5.1)
SODIUM: 137 mmol/L (ref 135–145)

## 2016-06-13 LAB — CBC
HEMATOCRIT: 39.6 % (ref 36.0–46.0)
HEMOGLOBIN: 13.8 g/dL (ref 12.0–15.0)
MCH: 30 pg (ref 26.0–34.0)
MCHC: 34.8 g/dL (ref 30.0–36.0)
MCV: 86.1 fL (ref 78.0–100.0)
Platelets: 265 10*3/uL (ref 150–400)
RBC: 4.6 MIL/uL (ref 3.87–5.11)
RDW: 12.5 % (ref 11.5–15.5)
WBC: 19.7 10*3/uL — ABNORMAL HIGH (ref 4.0–10.5)

## 2016-06-13 MED ORDER — OXYCODONE HCL 10 MG PO TABS: 5.0000 mg | ORAL_TABLET | ORAL | 0 refills | Status: DC | PRN

## 2016-06-13 MED ORDER — METHOCARBAMOL 500 MG PO TABS: 500.0000 mg | ORAL_TABLET | Freq: Four times a day (QID) | ORAL | 0 refills | Status: DC | PRN

## 2016-06-13 MED ORDER — ASPIRIN 325 MG PO TBEC: 325.0000 mg | DELAYED_RELEASE_TABLET | Freq: Two times a day (BID) | ORAL | 0 refills | Status: DC

## 2016-06-13 NOTE — Discharge Summary (Signed)
SPORTS MEDICINE & JOINT REPLACEMENT   Lara Mulch, MD   Carlyon Shadow, PA-C [place], [place], [place]  [address]                             [phone]  PATIENT ID: Molly Ferrell        MRN:  [id number]          DOB/AGE: 1950/12/19 / 66 y.o.    DISCHARGE SUMMARY  ADMISSION DATE:    06/12/2016 DISCHARGE DATE:   06/13/2016   ADMISSION DIAGNOSIS: primary osteoarthritis right knee    DISCHARGE DIAGNOSIS:  primary osteoarthritis right knee    ADDITIONAL DIAGNOSIS: Active Problems:   S/P total knee replacement  Past Medical History:  Diagnosis Date  . Arthritis   . Complication of anesthesia    "many years ago, had problems waking up"  . GERD (gastroesophageal reflux disease)   . Hard of hearing   . History of bronchitis   . Hypercholesteremia   . Hypertriglyceridemia   . Hypothyroidism   . Macular degeneration of both eyes   . OA (osteoarthritis)    severe right knee  . Wears glasses     PROCEDURE: Procedure(s): TOTAL KNEE ARTHROPLASTY on 06/12/2016  CONSULTS:    HISTORY:  See H&P in chart  HOSPITAL COURSE:  Molly Ferrell is a 66 y.o. admitted on 06/12/2016 and found to have a diagnosis of primary osteoarthritis right knee.  After appropriate laboratory studies were obtained  they were taken to the operating room on 06/12/2016 and underwent Procedure(s): TOTAL KNEE ARTHROPLASTY.   They were given perioperative antibiotics:  Anti-infectives    Start     Dose/Rate Route Frequency Ordered Stop   06/12/16 1600  clindamycin (CLEOCIN) IVPB 600 mg     600 mg 100 mL/hr over 30 Minutes Intravenous Every 6 hours 06/12/16 1541 06/12/16 2134   06/12/16 0900  clindamycin (CLEOCIN) IVPB 900 mg     900 mg 100 mL/hr over 30 Minutes Intravenous To ShortStay Surgical 06/09/16 1042 06/12/16 1005    .  Patient given tranexamic acid IV or topical and exparel intra-operatively.  Tolerated the procedure well.    POD# 1: Vital signs were stable.  Patient denied  Chest pain, shortness of breath, or calf pain.  Patient was started on Lovenox 30 mg subcutaneously twice daily at 8am.  Consults to PT, OT, and care management were made.  The patient was weight bearing as tolerated.  CPM was placed on the operative leg 0-90 degrees for 6-8 hours a day. When out of the CPM, patient was placed in the foam block to achieve full extension. Incentive spirometry was taught.  Dressing was changed.       POD #2, Continued  PT for ambulation and exercise program.  IV saline locked.  O2 discontinued.    The remainder of the hospital course was dedicated to ambulation and strengthening.   The patient was discharged on 1 Day Post-Op in  Good condition.  Blood products given:none  DIAGNOSTIC STUDIES: Recent vital signs: Patient Vitals for the past 24 hrs:  BP Temp Temp src Pulse Resp SpO2  06/13/16 0853 113/60 98.1 F (36.7 C) Oral 68 - 100 %  06/13/16 0700 (!) 104/56 98 F (36.7 C) Oral 72 - 96 %  06/12/16 2300 (!) 97/53 97.8 F (36.6 C) Oral 82 17 96 %  06/12/16 1537 127/68 97.5 F (36.4 C) - 94 18 94 %  06/12/16 1500 127/68 98.1 F (36.7 C) - (!) 105 (!) 27 98 %  06/12/16 1445 129/74 - - 87 (!) 21 98 %  06/12/16 1430 131/74 - - 99 (!) 23 98 %  06/12/16 1415 99/81 - - 77 (!) 22 98 %  06/12/16 1400 117/72 - - 92 18 98 %  06/12/16 1345 118/74 - - 85 (!) 23 96 %  06/12/16 1330 115/71 - - 93 (!) 26 99 %  06/12/16 1315 116/70 - - 70 17 97 %  06/12/16 1300 106/62 - - 77 20 98 %  06/12/16 1245 120/63 - - 72 18 100 %  06/12/16 1230 117/65 - - 71 (!) 21 100 %  06/12/16 1215 106/85 - - 74 19 97 %  06/12/16 1200 113/60 - - 83 (!) 21 100 %       Recent laboratory studies:  Recent Labs  06/13/16 0618  WBC 19.7*  HGB 13.8  HCT 39.6  PLT 265    Recent Labs  06/13/16 0618  NA 137  K 3.7  CL 105  CO2 22  BUN 11  CREATININE 0.87  GLUCOSE 110*  CALCIUM 8.9   Lab Results  Component Value Date   INR 1.03 06/03/2015     Recent Radiographic Studies  :  No results found.  DISCHARGE INSTRUCTIONS: Discharge Instructions    CPM    Complete by:  As directed    Continuous passive motion machine (CPM):      Use the CPM from 0 to 90 for 4-6 hours per day.      You may increase by 10 per day.  You may break it up into 2 or 3 sessions per day.      Use CPM for 2 weeks or until you are told to stop.   Call MD / Call 911    Complete by:  As directed    If you experience chest pain or shortness of breath, CALL 911 and be transported to the hospital emergency room.  If you develope a fever above 101 F, pus (white drainage) or increased drainage or redness at the wound, or calf pain, call your surgeon's office.   Constipation Prevention    Complete by:  As directed    Drink plenty of fluids.  Prune juice may be helpful.  You may use a stool softener, such as Colace (over the counter) 100 mg twice a day.  Use MiraLax (over the counter) for constipation as needed.   Diet - low sodium heart healthy    Complete by:  As directed    Discharge instructions    Complete by:  As directed    INSTRUCTIONS AFTER JOINT REPLACEMENT   Remove items at home which could result in a fall. This includes throw rugs or furniture in walking pathways ICE to the affected joint every three hours while awake for 30 minutes at a time, for at least the first 3-5 days, and then as needed for pain and swelling.  Continue to use ice for pain and swelling. You may notice swelling that will progress down to the foot and ankle.  This is normal after surgery.  Elevate your leg when you are not up walking on it.   Continue to use the breathing machine you got in the hospital (incentive spirometer) which will help keep your temperature down.  It is common for your temperature to cycle up and down following surgery, especially at night when you are not up moving  around and exerting yourself.  The breathing machine keeps your lungs expanded and your temperature down.   DIET:  As you were  doing prior to hospitalization, we recommend a well-balanced diet.  DRESSING / WOUND CARE / SHOWERING  Keep the surgical dressing until follow up.  The dressing is water proof, so you can shower without any extra covering.  IF THE DRESSING FALLS OFF or the wound gets wet inside, change the dressing with sterile gauze.  Please use good hand washing techniques before changing the dressing.  Do not use any lotions or creams on the incision until instructed by your surgeon.    ACTIVITY  Increase activity slowly as tolerated, but follow the weight bearing instructions below.   No driving for 6 weeks or until further direction given by your physician.  You cannot drive while taking narcotics.  No lifting or carrying greater than 10 lbs. until further directed by your surgeon. Avoid periods of inactivity such as sitting longer than an hour when not asleep. This helps prevent blood clots.  You may return to work once you are authorized by your doctor.     WEIGHT BEARING   Weight bearing as tolerated with assist device (walker, cane, etc) as directed, use it as long as suggested by your surgeon or therapist, typically at least 4-6 weeks.   EXERCISES  Results after joint replacement surgery are often greatly improved when you follow the exercise, range of motion and muscle strengthening exercises prescribed by your doctor. Safety measures are also important to protect the joint from further injury. Any time any of these exercises cause you to have increased pain or swelling, decrease what you are doing until you are comfortable again and then slowly increase them. If you have problems or questions, call your caregiver or physical therapist for advice.   Rehabilitation is important following a joint replacement. After just a few days of immobilization, the muscles of the leg can become weakened and shrink (atrophy).  These exercises are designed to build up the tone and strength of the thigh and leg  muscles and to improve motion. Often times heat used for twenty to thirty minutes before working out will loosen up your tissues and help with improving the range of motion but do not use heat for the first two weeks following surgery (sometimes heat can increase post-operative swelling).   These exercises can be done on a training (exercise) mat, on the floor, on a table or on a bed. Use whatever works the best and is most comfortable for you.    Use music or television while you are exercising so that the exercises are a pleasant break in your day. This will make your life better with the exercises acting as a break in your routine that you can look forward to.   Perform all exercises about fifteen times, three times per day or as directed.  You should exercise both the operative leg and the other leg as well.   Exercises include:   Quad Sets - Tighten up the muscle on the front of the thigh (Quad) and hold for 5-10 seconds.   Straight Leg Raises - With your knee straight (if you were given a brace, keep it on), lift the leg to 60 degrees, hold for 3 seconds, and slowly lower the leg.  Perform this exercise against resistance later as your leg gets stronger.  Leg Slides: Lying on your back, slowly slide your foot toward your buttocks, bending your knee  up off the floor (only go as far as is comfortable). Then slowly slide your foot back down until your leg is flat on the floor again.  Angel Wings: Lying on your back spread your legs to the side as far apart as you can without causing discomfort.  Hamstring Strength:  Lying on your back, push your heel against the floor with your leg straight by tightening up the muscles of your buttocks.  Repeat, but this time bend your knee to a comfortable angle, and push your heel against the floor.  You may put a pillow under the heel to make it more comfortable if necessary.   A rehabilitation program following joint replacement surgery can speed recovery and  prevent re-injury in the future due to weakened muscles. Contact your doctor or a physical therapist for more information on knee rehabilitation.    CONSTIPATION  Constipation is defined medically as fewer than three stools per week and severe constipation as less than one stool per week.  Even if you have a regular bowel pattern at home, your normal regimen is likely to be disrupted due to multiple reasons following surgery.  Combination of anesthesia, postoperative narcotics, change in appetite and fluid intake all can affect your bowels.   YOU MUST use at least one of the following options; they are listed in order of increasing strength to get the job done.  They are all available over the counter, and you may need to use some, POSSIBLY even all of these options:    Drink plenty of fluids (prune juice may be helpful) and high fiber foods Colace 100 mg by mouth twice a day  Senokot for constipation as directed and as needed Dulcolax (bisacodyl), take with full glass of water  Miralax (polyethylene glycol) once or twice a day as needed.  If you have tried all these things and are unable to have a bowel movement in the first 3-4 days after surgery call either your surgeon or your primary doctor.    If you experience loose stools or diarrhea, hold the medications until you stool forms back up.  If your symptoms do not get better within 1 week or if they get worse, check with your doctor.  If you experience "the worst abdominal pain ever" or develop nausea or vomiting, please contact the office immediately for further recommendations for treatment.   ITCHING:  If you experience itching with your medications, try taking only a single pain pill, or even half a pain pill at a time.  You can also use Benadryl over the counter for itching or also to help with sleep.   TED HOSE STOCKINGS:  Use stockings on both legs until for at least 2 weeks or as directed by physician office. They may be removed at  night for sleeping.  MEDICATIONS:  See your medication summary on the "After Visit Summary" that nursing will review with you.  You may have some home medications which will be placed on hold until you complete the course of blood thinner medication.  It is important for you to complete the blood thinner medication as prescribed.  PRECAUTIONS:  If you experience chest pain or shortness of breath - call 911 immediately for transfer to the hospital emergency department.   If you develop a fever greater that 101 F, purulent drainage from wound, increased redness or drainage from wound, foul odor from the wound/dressing, or calf pain - CONTACT YOUR SURGEON.  FOLLOW-UP APPOINTMENTS:  If you do not already have a post-op appointment, please call the office for an appointment to be seen by your surgeon.  Guidelines for how soon to be seen are listed in your "After Visit Summary", but are typically between 1-4 weeks after surgery.  OTHER INSTRUCTIONS:   Knee Replacement:  Do not place pillow under knee, focus on keeping the knee straight while resting. CPM instructions: 0-90 degrees, 2 hours in the morning, 2 hours in the afternoon, and 2 hours in the evening. Place foam block, curve side up under heel at all times except when in CPM or when walking.  DO NOT modify, tear, cut, or change the foam block in any way.  MAKE SURE YOU:  Understand these instructions.  Get help right away if you are not doing well or get worse.    Thank you for letting us be a part of your medical care team.  It is a privilege we respect greatly.  We hope these instructions will help you stay on track for a fast and full recovery!   Increase activity slowly as tolerated    Complete by:  As directed       DISCHARGE MEDICATIONS:   Allergies as of 06/13/2016      Reactions   Penicillins Hives, Itching   CHILDHOOD reaction to Pen G Has patient had a PCN reaction causing  immediate rash, facial/tongue/throat swelling, SOB or lightheadedness with hypotension: No Has patient had a PCN reaction causing severe rash involving mucus membranes or skin necrosis: No Has patient had a PCN reaction that required hospitalization: No Has patient had a PCN reaction occurring within the last 10 years: No If all of the above answers are "NO", then may proceed with Cephalosporin use.      Medication List    TAKE these medications   aspirin 325 MG EC tablet Take 1 tablet (325 mg total) by mouth 2 (two) times daily. What changed:  medication strength  how much to take   buPROPion 150 MG 24 hr tablet Commonly known as:  WELLBUTRIN XL Take 150 mg by mouth at bedtime.   FISH OIL PO Take 1 capsule by mouth daily.   levothyroxine 88 MCG tablet Commonly known as:  SYNTHROID, LEVOTHROID Take 88 mcg by mouth daily before breakfast.   liothyronine 25 MCG tablet Commonly known as:  CYTOMEL Take 25 mcg by mouth daily before breakfast.   LUTEIN PO Take 1 tablet by mouth 2 (two) times daily.   methocarbamol 500 MG tablet Commonly known as:  ROBAXIN Take 1-2 tablets (500-1,000 mg total) by mouth every 6 (six) hours as needed for muscle spasms.   niacin 500 MG tablet Take 1,000 mg by mouth daily.   omeprazole 40 MG capsule Commonly known as:  PRILOSEC Take 40 mg by mouth daily.   Oxycodone HCl 10 MG Tabs Take 0.5-1 tablets (5-10 mg total) by mouth every 3 (three) hours as needed for breakthrough pain.   PROGESTERONE MICRONIZED PO Take 200 mg by mouth at bedtime. Compounded at [facility] in [place].   REFRESH OPTIVE ADVANCED 0.5-1-0.5 % Soln Generic drug:  Carboxymeth-Glycerin-Polysorb Place 1 drop into both eyes 2 (two) times daily as needed (itchy dry eyes).   vitamin C 1000 MG tablet Take 1,000 mg by mouth daily.   Vitamin D 2000 units Caps Take 2,000 Units by mouth daily.   vitamin E 400 UNIT capsule Take 400 Units by mouth daily.  Durable Medical Equipment        Start     Ordered   06/12/16 1542  DME Walker rolling  Once    Question:  Patient needs a walker to treat with the following condition  Answer:  S/P total knee replacement   06/12/16 1541   06/12/16 1542  DME 3 n 1  Once     06/12/16 1541   06/12/16 1542  DME Bedside commode  Once    Question:  Patient needs a bedside commode to treat with the following condition  Answer:  S/P total knee replacement   06/12/16 1541      FOLLOW UP VISIT:    DISPOSITION: HOME VS. SNF  CONDITION:  Good   Donia Ast 06/13/2016, 11:47 AM

## 2016-06-13 NOTE — Progress Notes (Signed)
OT Screening/Cancellation Note  Patient Details Name: Molly Ferrell MRN: [id number] DOB: March 25, 1950  Occupational Therapy Screening Only Cancelled Treatment:     Reason pt not seen, evaluation not completed. Orders received for Occupational Therapy and chart reviewed. Pt is currently s/p R TKA. She was screened by OT & reported that she had L TKA and L total knee revision in 05/2015. She states that she has necessary DME, a walk in shower w/ seat and has been up ambulating in her room w/o assist following this R TKA. She plans to d/c home today and states that she will have necessary assist from her spouse PRN. She denies any needs for acute OT at this time. Pt was educated that OT will sign off and should she have any questions or concerns, to please make her MD aware. She verbalized understanding of this.  Carlynn Herald, [name] Beth Dixon, OTR/L 06/13/2016, 9:25 AM

## 2016-06-13 NOTE — Op Note (Signed)
TOTAL KNEE REPLACEMENT OPERATIVE NOTE:  06/12/2016  1:44 PM  PATIENT:  Molly Ferrell  66 y.o. female  PRE-OPERATIVE DIAGNOSIS:  primary osteoarthritis right knee  POST-OPERATIVE DIAGNOSIS:  primary osteoarthritis right knee  PROCEDURE:  Procedure(s): TOTAL KNEE ARTHROPLASTY  SURGEON:  Surgeon(s): Vickey Huger, MD  PHYSICIAN ASSISTANT: Carlyon Shadow, PAC /  ANESTHESIA:   spinal  DRAINS: Hemovac  SPECIMEN: None  COUNTS:  Correct  TOURNIQUET:   Total Tourniquet Time Documented: Thigh (Right) - 38 minutes Total: Thigh (Right) - 38 minutes   DICTATION:  Indication for procedure:    The patient is a 66 y.o. female who has failed conservative treatment for primary osteoarthritis right knee.  Informed consent was obtained prior to anesthesia. The risks versus benefits of the operation were explain and in a way the patient can, and did, understand.   On the implant demand matching protocol, this patient scored 10.  Therefore, this patient was not receive a polyethylene insert with vitamin E which is a high demand implant.  Description of procedure:     The patient was taken to the operating room and placed under anesthesia.  The patient was positioned in the usual fashion taking care that all body parts were adequately padded and/or protected.  I foley catheter was not placed.  A tourniquet was applied and the leg prepped and draped in the usual sterile fashion.  The extremity was exsanguinated with the esmarch and tourniquet inflated to 350 mmHg.  Pre-operative range of motion was normal.  The knee was in 5 degree of mild valgus.  A midline incision approximately 6-7 inches long was made with a #10 blade.  A new blade was used to make a parapatellar arthrotomy going 2-3 cm into the quadriceps tendon, over the patella, and alongside the medial aspect of the patellar tendon.  A synovectomy was then performed with the #10 blade and forceps. I then elevated the deep MCL off the  medial tibial metaphysis subperiosteally around to the semimembranosus attachment.    I everted the patella and used calipers to measure patellar thickness.  I used the reamer to ream down to appropriate thickness to recreate the native thickness.  I then removed excess bone with the rongeur and sagittal saw.  I used the appropriately sized template and drilled the three lug holes.  I then put the trial in place and measured the thickness with the calipers to ensure recreation of the native thickness.  The trial was then removed and the patella subluxed and the knee brought into flexion.  A homan retractor was place to retract and protect the patella and lateral structures.  A Z-retractor was place medially to protect the medial structures.  The extra-medullary alignment system was used to make cut the tibial articular surface perpendicular to the anamotic axis of the tibia and in 3 degrees of posterior slope.  The cut surface and alignment jig was removed.  I then used the intramedullary alignment guide to make a 6 valgus cut on the distal femur.  I then marked out the epicondylar axis on the distal femur.  The posterior condylar axis measured 3 degrees.  I then used the anterior referencing sizer and measured the femur to be a size 5.  The 4-In-1 cutting block was screwed into place in external rotation matching the posterior condylar angle, making our cuts perpendicular to the epicondylar axis.  Anterior, posterior and chamfer cuts were made with the sagittal saw.  The cutting block and cut pieces were  removed.  A lamina spreader was placed in 90 degrees of flexion.  The ACL, PCL, menisci, and posterior condylar osteophytes were removed.  A 10 mm spacer blocked was found to offer good flexion and extension gap balance after minimal in degree releasing.   The scoop retractor was then placed and the femoral finishing block was pinned in place.  The small sagittal saw was used as well as the lug drill to  finish the femur.  The block and cut surfaces were removed and the medullary canal hole filled with autograft bone from the cut pieces.  The tibia was delivered forward in deep flexion and external rotation.  A size C tray was selected and pinned into place centered on the medial 1/3 of the tibial tubercle.  The reamer and keel was used to prepare the tibia through the tray.    I then trialed with the size 5 femur, size C tibia, a 10 mm insert and the 30 patella.  I had excellent flexion/extension gap balance, excellent patella tracking.  Flexion was full and beyond 120 degrees; extension was zero.  These components were chosen and the staff opened them to me on the back table while the knee was lavaged copiously and the cement mixed.  The soft tissue was infiltrated with 60cc of exparel 1.3% through a 21 gauge needle.  I cemented in the components and removed all excess cement.  The polyethylene tibial component was snapped into place and the knee placed in extension while cement was hardening.  The capsule was infilltrated with 30cc of .25% Marcaine with epinephrine.  A hemovac was place in the joint exiting superolaterally.  A pain pump was place superomedially superficial to the arthrotomy.  Once the cement was hard, the tourniquet was let down.  Hemostasis was obtained.  The arthrotomy was closed with figure-8 #1 vicryl sutures.  The deep soft tissues were closed with #0 vicryls and the subcuticular layer closed with a running #2-0 vicryl.  The skin was reapproximated and closed with skin staples.  The wound was dressed with xeroform, 4 x4's, 2 ABD sponges, a single layer of webril and a TED stocking.   The patient was then awakened, extubated, and taken to the recovery room in stable condition.  BLOOD LOSS:  300cc DRAINS: 1 hemovac, 1 pain catheter COMPLICATIONS:  None.  PLAN OF CARE: Admit to inpatient   PATIENT DISPOSITION:  PACU - hemodynamically stable.   Delay start of Pharmacological  VTE agent (>24hrs) due to surgical blood loss or risk of bleeding:  not applicable  Please fax a copy of this op note to my office at [phone] (please only include page 1 and 2 of the Case Information op note)

## 2016-06-13 NOTE — Progress Notes (Signed)
Physical Therapy Treatment Patient Details Name: Molly Ferrell MRN: [id number] DOB: 15-Nov-1950 Today's Date: 06/13/2016    History of Present Illness Admitted for RTKA, WBAT;  has a past medical history of Arthritis; Complication of anesthesia; GERD (gastroesophageal reflux disease); Hard of hearing; Macular degeneration of both eyes; OA (osteoarthritis); and Wears glasses.  has a past surgical history that includes  Knee Arthroplasty (Left);  Total knee revision (Left, 06/14/2015)    PT Comments    Patient is progressing very well toward mobility goals and ROM. Pt with c/o lightheadedness and nausea with drop in BP to 63/37 after ambulating to ortho gym from room. BP up to 99/53 after ~2 mins in supine and symptoms subsided. RN aware. Pt is able to negotiate stairs safely and is overall supervision for OOB mobility. Current plan remains appropriate.     Follow Up Recommendations  Home health PT     Equipment Recommendations  None recommended by PT    Recommendations for Other Services       Precautions / Restrictions Precautions Precautions: Knee Precaution Comments: reviewed precautions/positioning with pt Restrictions Weight Bearing Restrictions: Yes RLE Weight Bearing: Weight bearing as tolerated    Mobility  Bed Mobility Overal bed mobility: Independent                Transfers Overall transfer level: Needs assistance Equipment used: Rolling walker (2 wheeled) Transfers: Sit to/from Stand Sit to Stand: Supervision         General transfer comment: safe hand placement demonstrated  Ambulation/Gait Ambulation/Gait assistance: Supervision Ambulation Distance (Feet): 120 Feet Assistive device: Rolling walker (2 wheeled) Gait Pattern/deviations: Step-through pattern     General Gait Details: pt with good step length symmetry and weightbearing    Stairs Stairs: Yes   Stair Management: One rail Right;Step to pattern;Forwards Number of Stairs:  2 General stair comments: cues for sequencing and technique  Wheelchair Mobility    Modified Rankin (Stroke Patients Only)       Balance                                            Cognition Arousal/Alertness: Awake/alert Behavior During Therapy: WFL for tasks assessed/performed Overall Cognitive Status: Within Functional Limits for tasks assessed                                        Exercises Total Joint Exercises Quad Sets: AROM;Right;10 reps Short Arc Quad: AROM;Right;10 reps Heel Slides: AROM;Right;10 reps Hip ABduction/ADduction: AROM;Right;10 reps Straight Leg Raises: AROM;Right;10 reps Long Arc Quad: AROM;Right;10 reps Knee Flexion: AROM;Right;5 reps;Seated;Other (comment) (10 second holds) Goniometric ROM: 0-95    General Comments General comments (skin integrity, edema, etc.): pt with c/o lightheadedness and nausea during session while standing and ambulating; BP 63/37 HR 50 in sitting and after ~2 mins in supine BP 99/53 and dizziness subside; unable to obtain standing BP; SpO2 WNL throughout session      Pertinent Vitals/Pain Pain Assessment: Faces Faces Pain Scale: Hurts little more Pain Location: R knee Pain Descriptors / Indicators: Aching Pain Intervention(s): Limited activity within patient's tolerance;Monitored during session;Premedicated before session;Repositioned    Home Living                      Prior Function  PT Goals (current goals can now be found in the care plan section) Acute Rehab PT Goals Patient Stated Goal: walk without pain PT Goal Formulation: With patient Time For Goal Achievement: 06/19/16 Potential to Achieve Goals: Good Progress towards PT goals: Progressing toward goals    Frequency    7X/week      PT Plan Current plan remains appropriate    Co-evaluation             End of Session Equipment Utilized During Treatment: Gait belt Activity Tolerance:  Patient tolerated treatment well Patient left: with call bell/phone within reach;in bed Nurse Communication: Mobility status PT Visit Diagnosis: Pain Pain - Right/Left: Right Pain - part of body: Knee     Time: 1287-8676 PT Time Calculation (min) (ACUTE ONLY): 47 min  Charges:  $Gait Training: 8-22 mins $Therapeutic Exercise: 8-22 mins $Therapeutic Activity: 8-22 mins                    G Codes:       Molly Ferrell, PTA Pager: [phone]     Darliss Cheney 06/13/2016, 12:12 PM

## 2016-06-13 NOTE — Progress Notes (Signed)
SPORTS MEDICINE AND JOINT REPLACEMENT  Lara Mulch, MD    Carlyon Shadow, PA-C [address], [place], [place]  [address]                             [phone]   PROGRESS NOTE  Subjective:  negative for Chest Pain  negative for Shortness of Breath  negative for Nausea/Vomiting   negative for Calf Pain  negative for Bowel Movement   Tolerating Diet: yes         Patient reports pain as 3 on 0-10 scale.    Objective: Vital signs in last 24 hours:   Patient Vitals for the past 24 hrs:  BP Temp Temp src Pulse Resp SpO2 Height Weight  06/12/16 2300 (!) 97/53 97.8 F (36.6 C) Oral 82 17 96 % - -  06/12/16 1537 127/68 97.5 F (36.4 C) - 94 18 94 % - -  06/12/16 1500 127/68 98.1 F (36.7 C) - (!) 105 (!) 27 98 % - -  06/12/16 1445 129/74 - - 87 (!) 21 98 % - -  06/12/16 1430 131/74 - - 99 (!) 23 98 % - -  06/12/16 1415 99/81 - - 77 (!) 22 98 % - -  06/12/16 1400 117/72 - - 92 18 98 % - -  06/12/16 1345 118/74 - - 85 (!) 23 96 % - -  06/12/16 1330 115/71 - - 93 (!) 26 99 % - -  06/12/16 1315 116/70 - - 70 17 97 % - -  06/12/16 1300 106/62 - - 77 20 98 % - -  06/12/16 1245 120/63 - - 72 18 100 % - -  06/12/16 1230 117/65 - - 71 (!) 21 100 % - -  06/12/16 1215 106/85 - - 74 19 97 % - -  06/12/16 1200 113/60 - - 83 (!) 21 100 % - -  06/12/16 1145 117/68 97.5 F (36.4 C) - 78 17 100 % - -  06/12/16 0730 130/70 97.9 F (36.6 C) - 60 (!) 22 98 % 5' 0.5" (1.537 m) 56.7 kg (125 lb)    @flow {1959:LAST@   Intake/Output from previous day:   04/16 0701 - 04/17 0700 In: 1325 [P.O.:125; I.V.:1200] Out: 250 [Urine:200]   Intake/Output this shift:   No intake/output data recorded.   Intake/Output      04/16 0701 - 04/17 0700 04/17 0701 - 04/18 0700   P.O. 125    I.V. (mL/kg) 1200 (21.2)    Total Intake(mL/kg) 1325 (23.4)    Urine (mL/kg/hr) 200    Blood 50    Total Output 250     Net +1075          Urine Occurrence 3 x       LABORATORY DATA: No results for  input(s): WBC, HGB, HCT, PLT in the last 168 hours. No results for input(s): NA, K, CL, CO2, BUN, CREATININE, GLUCOSE, CALCIUM in the last 168 hours. Lab Results  Component Value Date   INR 1.03 06/03/2015    Examination:  General appearance: alert, cooperative and no distress Extremities: extremities normal, atraumatic, no cyanosis or edema  Wound Exam: clean, dry, intact   Drainage:  None: wound tissue dry  Motor Exam: Quadriceps and Hamstrings Intact  Sensory Exam: Superficial Peroneal, Deep Peroneal and Tibial normal   Assessment:    1 Day Post-Op  Procedure(s) (LRB): TOTAL KNEE ARTHROPLASTY (Right)  ADDITIONAL DIAGNOSIS:  Active  Problems:   S/P total knee replacement     Plan: Physical Therapy as ordered Weight Bearing as Tolerated (WBAT)  DVT Prophylaxis:  Aspirin  DISCHARGE PLAN: Home  DISCHARGE NEEDS: HHPT   Patient doing well, will D/C home today         Donia Ast 06/13/2016, 7:14 AM

## 2016-06-13 NOTE — Progress Notes (Signed)
Discharge instructions RX's and follow up appts explained and provided to patient and family verbalized understanding. Home health set up for patient contact information provided. Patient left floor via wheelchair accompanied by staff no c/o pain or shortness of breath at d/c.  Guy Toney, Tivis Ringer, RN

## 2016-06-14 DIAGNOSIS — Z7982 Long term (current) use of aspirin: Secondary | ICD-10-CM | POA: Diagnosis not present

## 2016-06-14 DIAGNOSIS — Z471 Aftercare following joint replacement surgery: Secondary | ICD-10-CM | POA: Diagnosis not present

## 2016-06-14 DIAGNOSIS — Z96651 Presence of right artificial knee joint: Secondary | ICD-10-CM | POA: Diagnosis not present

## 2016-06-14 DIAGNOSIS — M1711 Unilateral primary osteoarthritis, right knee: Secondary | ICD-10-CM | POA: Diagnosis not present

## 2016-06-14 DIAGNOSIS — Z87891 Personal history of nicotine dependence: Secondary | ICD-10-CM | POA: Diagnosis not present

## 2016-06-14 DIAGNOSIS — Z79891 Long term (current) use of opiate analgesic: Secondary | ICD-10-CM | POA: Diagnosis not present

## 2016-06-14 DIAGNOSIS — Z96653 Presence of artificial knee joint, bilateral: Secondary | ICD-10-CM | POA: Diagnosis not present

## 2016-06-15 DIAGNOSIS — Z96653 Presence of artificial knee joint, bilateral: Secondary | ICD-10-CM | POA: Diagnosis not present

## 2016-06-15 DIAGNOSIS — Z7982 Long term (current) use of aspirin: Secondary | ICD-10-CM | POA: Diagnosis not present

## 2016-06-15 DIAGNOSIS — Z79891 Long term (current) use of opiate analgesic: Secondary | ICD-10-CM | POA: Diagnosis not present

## 2016-06-15 DIAGNOSIS — Z87891 Personal history of nicotine dependence: Secondary | ICD-10-CM | POA: Diagnosis not present

## 2016-06-15 DIAGNOSIS — Z471 Aftercare following joint replacement surgery: Secondary | ICD-10-CM | POA: Diagnosis not present

## 2016-06-19 DIAGNOSIS — Z79891 Long term (current) use of opiate analgesic: Secondary | ICD-10-CM | POA: Diagnosis not present

## 2016-06-19 DIAGNOSIS — Z471 Aftercare following joint replacement surgery: Secondary | ICD-10-CM | POA: Diagnosis not present

## 2016-06-19 DIAGNOSIS — Z96653 Presence of artificial knee joint, bilateral: Secondary | ICD-10-CM | POA: Diagnosis not present

## 2016-06-19 DIAGNOSIS — Z7982 Long term (current) use of aspirin: Secondary | ICD-10-CM | POA: Diagnosis not present

## 2016-06-19 DIAGNOSIS — Z87891 Personal history of nicotine dependence: Secondary | ICD-10-CM | POA: Diagnosis not present

## 2016-06-22 DIAGNOSIS — Z471 Aftercare following joint replacement surgery: Secondary | ICD-10-CM | POA: Diagnosis not present

## 2016-06-22 DIAGNOSIS — Z96651 Presence of right artificial knee joint: Secondary | ICD-10-CM | POA: Diagnosis not present

## 2016-06-23 DIAGNOSIS — M25561 Pain in right knee: Secondary | ICD-10-CM | POA: Diagnosis not present

## 2016-06-23 DIAGNOSIS — R2689 Other abnormalities of gait and mobility: Secondary | ICD-10-CM | POA: Diagnosis not present

## 2016-06-23 DIAGNOSIS — Z96651 Presence of right artificial knee joint: Secondary | ICD-10-CM | POA: Diagnosis not present

## 2016-06-23 DIAGNOSIS — M25461 Effusion, right knee: Secondary | ICD-10-CM | POA: Diagnosis not present

## 2016-06-26 DIAGNOSIS — M25561 Pain in right knee: Secondary | ICD-10-CM | POA: Diagnosis not present

## 2016-06-26 DIAGNOSIS — M25461 Effusion, right knee: Secondary | ICD-10-CM | POA: Diagnosis not present

## 2016-06-26 DIAGNOSIS — R2689 Other abnormalities of gait and mobility: Secondary | ICD-10-CM | POA: Diagnosis not present

## 2016-06-26 DIAGNOSIS — Z96651 Presence of right artificial knee joint: Secondary | ICD-10-CM | POA: Diagnosis not present

## 2016-06-28 DIAGNOSIS — M25461 Effusion, right knee: Secondary | ICD-10-CM | POA: Diagnosis not present

## 2016-06-28 DIAGNOSIS — Z96651 Presence of right artificial knee joint: Secondary | ICD-10-CM | POA: Diagnosis not present

## 2016-06-28 DIAGNOSIS — R2689 Other abnormalities of gait and mobility: Secondary | ICD-10-CM | POA: Diagnosis not present

## 2016-06-28 DIAGNOSIS — M25561 Pain in right knee: Secondary | ICD-10-CM | POA: Diagnosis not present

## 2016-06-30 DIAGNOSIS — Z96651 Presence of right artificial knee joint: Secondary | ICD-10-CM | POA: Diagnosis not present

## 2016-06-30 DIAGNOSIS — R2689 Other abnormalities of gait and mobility: Secondary | ICD-10-CM | POA: Diagnosis not present

## 2016-06-30 DIAGNOSIS — M25461 Effusion, right knee: Secondary | ICD-10-CM | POA: Diagnosis not present

## 2016-06-30 DIAGNOSIS — M25561 Pain in right knee: Secondary | ICD-10-CM | POA: Diagnosis not present

## 2016-07-03 DIAGNOSIS — Z96651 Presence of right artificial knee joint: Secondary | ICD-10-CM | POA: Diagnosis not present

## 2016-07-03 DIAGNOSIS — R2689 Other abnormalities of gait and mobility: Secondary | ICD-10-CM | POA: Diagnosis not present

## 2016-07-03 DIAGNOSIS — M25461 Effusion, right knee: Secondary | ICD-10-CM | POA: Diagnosis not present

## 2016-07-03 DIAGNOSIS — M25561 Pain in right knee: Secondary | ICD-10-CM | POA: Diagnosis not present

## 2016-07-05 DIAGNOSIS — R2689 Other abnormalities of gait and mobility: Secondary | ICD-10-CM | POA: Diagnosis not present

## 2016-07-05 DIAGNOSIS — Z96651 Presence of right artificial knee joint: Secondary | ICD-10-CM | POA: Diagnosis not present

## 2016-07-05 DIAGNOSIS — M25461 Effusion, right knee: Secondary | ICD-10-CM | POA: Diagnosis not present

## 2016-07-05 DIAGNOSIS — M25561 Pain in right knee: Secondary | ICD-10-CM | POA: Diagnosis not present

## 2016-07-07 DIAGNOSIS — R2689 Other abnormalities of gait and mobility: Secondary | ICD-10-CM | POA: Diagnosis not present

## 2016-07-07 DIAGNOSIS — Z96651 Presence of right artificial knee joint: Secondary | ICD-10-CM | POA: Diagnosis not present

## 2016-07-07 DIAGNOSIS — M25561 Pain in right knee: Secondary | ICD-10-CM | POA: Diagnosis not present

## 2016-07-07 DIAGNOSIS — M25461 Effusion, right knee: Secondary | ICD-10-CM | POA: Diagnosis not present

## 2016-07-10 DIAGNOSIS — M25461 Effusion, right knee: Secondary | ICD-10-CM | POA: Diagnosis not present

## 2016-07-10 DIAGNOSIS — M25561 Pain in right knee: Secondary | ICD-10-CM | POA: Diagnosis not present

## 2016-07-10 DIAGNOSIS — Z96651 Presence of right artificial knee joint: Secondary | ICD-10-CM | POA: Diagnosis not present

## 2016-07-10 DIAGNOSIS — R2689 Other abnormalities of gait and mobility: Secondary | ICD-10-CM | POA: Diagnosis not present

## 2016-07-12 DIAGNOSIS — R2689 Other abnormalities of gait and mobility: Secondary | ICD-10-CM | POA: Diagnosis not present

## 2016-07-12 DIAGNOSIS — Z96651 Presence of right artificial knee joint: Secondary | ICD-10-CM | POA: Diagnosis not present

## 2016-07-12 DIAGNOSIS — M25461 Effusion, right knee: Secondary | ICD-10-CM | POA: Diagnosis not present

## 2016-07-12 DIAGNOSIS — M25561 Pain in right knee: Secondary | ICD-10-CM | POA: Diagnosis not present

## 2016-07-14 DIAGNOSIS — Z96651 Presence of right artificial knee joint: Secondary | ICD-10-CM | POA: Diagnosis not present

## 2016-07-14 DIAGNOSIS — M25461 Effusion, right knee: Secondary | ICD-10-CM | POA: Diagnosis not present

## 2016-07-14 DIAGNOSIS — R2689 Other abnormalities of gait and mobility: Secondary | ICD-10-CM | POA: Diagnosis not present

## 2016-07-14 DIAGNOSIS — M25561 Pain in right knee: Secondary | ICD-10-CM | POA: Diagnosis not present

## 2016-07-19 DIAGNOSIS — Z96651 Presence of right artificial knee joint: Secondary | ICD-10-CM | POA: Diagnosis not present

## 2016-07-19 DIAGNOSIS — R2689 Other abnormalities of gait and mobility: Secondary | ICD-10-CM | POA: Diagnosis not present

## 2016-07-19 DIAGNOSIS — M25461 Effusion, right knee: Secondary | ICD-10-CM | POA: Diagnosis not present

## 2016-07-19 DIAGNOSIS — M25561 Pain in right knee: Secondary | ICD-10-CM | POA: Diagnosis not present

## 2016-07-21 DIAGNOSIS — Z96651 Presence of right artificial knee joint: Secondary | ICD-10-CM | POA: Diagnosis not present

## 2016-07-21 DIAGNOSIS — M25461 Effusion, right knee: Secondary | ICD-10-CM | POA: Diagnosis not present

## 2016-07-21 DIAGNOSIS — M25561 Pain in right knee: Secondary | ICD-10-CM | POA: Diagnosis not present

## 2016-07-21 DIAGNOSIS — R2689 Other abnormalities of gait and mobility: Secondary | ICD-10-CM | POA: Diagnosis not present

## 2016-07-26 DIAGNOSIS — R2689 Other abnormalities of gait and mobility: Secondary | ICD-10-CM | POA: Diagnosis not present

## 2016-07-26 DIAGNOSIS — M25461 Effusion, right knee: Secondary | ICD-10-CM | POA: Diagnosis not present

## 2016-07-26 DIAGNOSIS — Z96651 Presence of right artificial knee joint: Secondary | ICD-10-CM | POA: Diagnosis not present

## 2016-07-26 DIAGNOSIS — M25561 Pain in right knee: Secondary | ICD-10-CM | POA: Diagnosis not present

## 2016-07-28 DIAGNOSIS — M25561 Pain in right knee: Secondary | ICD-10-CM | POA: Diagnosis not present

## 2016-07-28 DIAGNOSIS — Z96651 Presence of right artificial knee joint: Secondary | ICD-10-CM | POA: Diagnosis not present

## 2016-07-28 DIAGNOSIS — R2689 Other abnormalities of gait and mobility: Secondary | ICD-10-CM | POA: Diagnosis not present

## 2016-07-28 DIAGNOSIS — M25461 Effusion, right knee: Secondary | ICD-10-CM | POA: Diagnosis not present

## 2016-07-31 DIAGNOSIS — M25561 Pain in right knee: Secondary | ICD-10-CM | POA: Diagnosis not present

## 2016-07-31 DIAGNOSIS — Z96651 Presence of right artificial knee joint: Secondary | ICD-10-CM | POA: Diagnosis not present

## 2016-07-31 DIAGNOSIS — M25461 Effusion, right knee: Secondary | ICD-10-CM | POA: Diagnosis not present

## 2016-07-31 DIAGNOSIS — R2689 Other abnormalities of gait and mobility: Secondary | ICD-10-CM | POA: Diagnosis not present

## 2016-08-03 DIAGNOSIS — M25461 Effusion, right knee: Secondary | ICD-10-CM | POA: Diagnosis not present

## 2016-08-03 DIAGNOSIS — Z96651 Presence of right artificial knee joint: Secondary | ICD-10-CM | POA: Diagnosis not present

## 2016-08-03 DIAGNOSIS — R2689 Other abnormalities of gait and mobility: Secondary | ICD-10-CM | POA: Diagnosis not present

## 2016-08-03 DIAGNOSIS — M25561 Pain in right knee: Secondary | ICD-10-CM | POA: Diagnosis not present

## 2016-09-07 DIAGNOSIS — Z13220 Encounter for screening for lipoid disorders: Secondary | ICD-10-CM | POA: Diagnosis not present

## 2016-09-07 DIAGNOSIS — K219 Gastro-esophageal reflux disease without esophagitis: Secondary | ICD-10-CM | POA: Diagnosis not present

## 2016-09-07 DIAGNOSIS — E782 Mixed hyperlipidemia: Secondary | ICD-10-CM | POA: Diagnosis not present

## 2016-09-07 DIAGNOSIS — N951 Menopausal and female climacteric states: Secondary | ICD-10-CM | POA: Diagnosis not present

## 2016-09-07 DIAGNOSIS — Z79899 Other long term (current) drug therapy: Secondary | ICD-10-CM | POA: Diagnosis not present

## 2016-09-07 DIAGNOSIS — F330 Major depressive disorder, recurrent, mild: Secondary | ICD-10-CM | POA: Diagnosis not present

## 2016-09-07 DIAGNOSIS — E039 Hypothyroidism, unspecified: Secondary | ICD-10-CM | POA: Diagnosis not present

## 2016-09-07 DIAGNOSIS — Z780 Asymptomatic menopausal state: Secondary | ICD-10-CM | POA: Diagnosis not present

## 2016-10-03 DIAGNOSIS — N951 Menopausal and female climacteric states: Secondary | ICD-10-CM | POA: Diagnosis not present

## 2016-10-11 DIAGNOSIS — Z79890 Hormone replacement therapy (postmenopausal): Secondary | ICD-10-CM | POA: Diagnosis not present

## 2016-10-11 DIAGNOSIS — Z79899 Other long term (current) drug therapy: Secondary | ICD-10-CM | POA: Diagnosis not present

## 2016-10-11 DIAGNOSIS — Z13220 Encounter for screening for lipoid disorders: Secondary | ICD-10-CM | POA: Diagnosis not present

## 2016-11-03 DIAGNOSIS — Z1231 Encounter for screening mammogram for malignant neoplasm of breast: Secondary | ICD-10-CM | POA: Diagnosis not present

## 2016-12-01 DIAGNOSIS — H353131 Nonexudative age-related macular degeneration, bilateral, early dry stage: Secondary | ICD-10-CM | POA: Diagnosis not present

## 2016-12-01 DIAGNOSIS — H25813 Combined forms of age-related cataract, bilateral: Secondary | ICD-10-CM | POA: Diagnosis not present

## 2016-12-07 DIAGNOSIS — Z96651 Presence of right artificial knee joint: Secondary | ICD-10-CM | POA: Diagnosis not present

## 2016-12-07 DIAGNOSIS — M7061 Trochanteric bursitis, right hip: Secondary | ICD-10-CM | POA: Diagnosis not present

## 2016-12-07 DIAGNOSIS — Z471 Aftercare following joint replacement surgery: Secondary | ICD-10-CM | POA: Diagnosis not present

## 2016-12-11 DIAGNOSIS — M25551 Pain in right hip: Secondary | ICD-10-CM | POA: Diagnosis not present

## 2016-12-11 DIAGNOSIS — M62551 Muscle wasting and atrophy, not elsewhere classified, right thigh: Secondary | ICD-10-CM | POA: Diagnosis not present

## 2016-12-11 DIAGNOSIS — R2689 Other abnormalities of gait and mobility: Secondary | ICD-10-CM | POA: Diagnosis not present

## 2016-12-12 DIAGNOSIS — E039 Hypothyroidism, unspecified: Secondary | ICD-10-CM | POA: Diagnosis not present

## 2016-12-12 DIAGNOSIS — R51 Headache: Secondary | ICD-10-CM | POA: Diagnosis not present

## 2016-12-13 DIAGNOSIS — M62551 Muscle wasting and atrophy, not elsewhere classified, right thigh: Secondary | ICD-10-CM | POA: Diagnosis not present

## 2016-12-13 DIAGNOSIS — M25551 Pain in right hip: Secondary | ICD-10-CM | POA: Diagnosis not present

## 2016-12-13 DIAGNOSIS — R2689 Other abnormalities of gait and mobility: Secondary | ICD-10-CM | POA: Diagnosis not present

## 2016-12-15 DIAGNOSIS — M25551 Pain in right hip: Secondary | ICD-10-CM | POA: Diagnosis not present

## 2016-12-15 DIAGNOSIS — R2689 Other abnormalities of gait and mobility: Secondary | ICD-10-CM | POA: Diagnosis not present

## 2016-12-15 DIAGNOSIS — M62551 Muscle wasting and atrophy, not elsewhere classified, right thigh: Secondary | ICD-10-CM | POA: Diagnosis not present

## 2016-12-18 DIAGNOSIS — L739 Follicular disorder, unspecified: Secondary | ICD-10-CM | POA: Diagnosis not present

## 2016-12-18 DIAGNOSIS — L728 Other follicular cysts of the skin and subcutaneous tissue: Secondary | ICD-10-CM | POA: Diagnosis not present

## 2016-12-18 DIAGNOSIS — L578 Other skin changes due to chronic exposure to nonionizing radiation: Secondary | ICD-10-CM | POA: Diagnosis not present

## 2016-12-18 DIAGNOSIS — M62551 Muscle wasting and atrophy, not elsewhere classified, right thigh: Secondary | ICD-10-CM | POA: Diagnosis not present

## 2016-12-18 DIAGNOSIS — R2689 Other abnormalities of gait and mobility: Secondary | ICD-10-CM | POA: Diagnosis not present

## 2016-12-18 DIAGNOSIS — M25551 Pain in right hip: Secondary | ICD-10-CM | POA: Diagnosis not present

## 2016-12-20 DIAGNOSIS — R2689 Other abnormalities of gait and mobility: Secondary | ICD-10-CM | POA: Diagnosis not present

## 2016-12-20 DIAGNOSIS — M62551 Muscle wasting and atrophy, not elsewhere classified, right thigh: Secondary | ICD-10-CM | POA: Diagnosis not present

## 2016-12-20 DIAGNOSIS — M25551 Pain in right hip: Secondary | ICD-10-CM | POA: Diagnosis not present

## 2016-12-22 DIAGNOSIS — M25551 Pain in right hip: Secondary | ICD-10-CM | POA: Diagnosis not present

## 2016-12-22 DIAGNOSIS — M62551 Muscle wasting and atrophy, not elsewhere classified, right thigh: Secondary | ICD-10-CM | POA: Diagnosis not present

## 2016-12-22 DIAGNOSIS — R2689 Other abnormalities of gait and mobility: Secondary | ICD-10-CM | POA: Diagnosis not present

## 2016-12-26 DIAGNOSIS — R2689 Other abnormalities of gait and mobility: Secondary | ICD-10-CM | POA: Diagnosis not present

## 2016-12-26 DIAGNOSIS — M25551 Pain in right hip: Secondary | ICD-10-CM | POA: Diagnosis not present

## 2016-12-26 DIAGNOSIS — M62551 Muscle wasting and atrophy, not elsewhere classified, right thigh: Secondary | ICD-10-CM | POA: Diagnosis not present

## 2016-12-28 DIAGNOSIS — M25551 Pain in right hip: Secondary | ICD-10-CM | POA: Diagnosis not present

## 2016-12-28 DIAGNOSIS — R2689 Other abnormalities of gait and mobility: Secondary | ICD-10-CM | POA: Diagnosis not present

## 2016-12-28 DIAGNOSIS — M62551 Muscle wasting and atrophy, not elsewhere classified, right thigh: Secondary | ICD-10-CM | POA: Diagnosis not present

## 2016-12-29 DIAGNOSIS — M62551 Muscle wasting and atrophy, not elsewhere classified, right thigh: Secondary | ICD-10-CM | POA: Diagnosis not present

## 2016-12-29 DIAGNOSIS — R2689 Other abnormalities of gait and mobility: Secondary | ICD-10-CM | POA: Diagnosis not present

## 2016-12-29 DIAGNOSIS — M25551 Pain in right hip: Secondary | ICD-10-CM | POA: Diagnosis not present

## 2017-01-04 DIAGNOSIS — R2689 Other abnormalities of gait and mobility: Secondary | ICD-10-CM | POA: Diagnosis not present

## 2017-01-04 DIAGNOSIS — M25551 Pain in right hip: Secondary | ICD-10-CM | POA: Diagnosis not present

## 2017-01-04 DIAGNOSIS — M62551 Muscle wasting and atrophy, not elsewhere classified, right thigh: Secondary | ICD-10-CM | POA: Diagnosis not present

## 2017-01-09 DIAGNOSIS — M25551 Pain in right hip: Secondary | ICD-10-CM | POA: Diagnosis not present

## 2017-01-09 DIAGNOSIS — R2689 Other abnormalities of gait and mobility: Secondary | ICD-10-CM | POA: Diagnosis not present

## 2017-01-09 DIAGNOSIS — M62551 Muscle wasting and atrophy, not elsewhere classified, right thigh: Secondary | ICD-10-CM | POA: Diagnosis not present

## 2017-01-11 DIAGNOSIS — R2689 Other abnormalities of gait and mobility: Secondary | ICD-10-CM | POA: Diagnosis not present

## 2017-01-11 DIAGNOSIS — M25551 Pain in right hip: Secondary | ICD-10-CM | POA: Diagnosis not present

## 2017-01-11 DIAGNOSIS — M62551 Muscle wasting and atrophy, not elsewhere classified, right thigh: Secondary | ICD-10-CM | POA: Diagnosis not present

## 2017-01-16 DIAGNOSIS — M62551 Muscle wasting and atrophy, not elsewhere classified, right thigh: Secondary | ICD-10-CM | POA: Diagnosis not present

## 2017-01-16 DIAGNOSIS — M25551 Pain in right hip: Secondary | ICD-10-CM | POA: Diagnosis not present

## 2017-01-16 DIAGNOSIS — R2689 Other abnormalities of gait and mobility: Secondary | ICD-10-CM | POA: Diagnosis not present

## 2017-01-25 DIAGNOSIS — Z96651 Presence of right artificial knee joint: Secondary | ICD-10-CM | POA: Diagnosis not present

## 2017-01-25 DIAGNOSIS — Z471 Aftercare following joint replacement surgery: Secondary | ICD-10-CM | POA: Diagnosis not present

## 2017-01-25 DIAGNOSIS — M6289 Other specified disorders of muscle: Secondary | ICD-10-CM | POA: Diagnosis not present

## 2017-01-25 DIAGNOSIS — M25461 Effusion, right knee: Secondary | ICD-10-CM | POA: Diagnosis not present

## 2017-02-14 DIAGNOSIS — R7989 Other specified abnormal findings of blood chemistry: Secondary | ICD-10-CM | POA: Diagnosis not present

## 2017-03-06 DIAGNOSIS — H353131 Nonexudative age-related macular degeneration, bilateral, early dry stage: Secondary | ICD-10-CM | POA: Diagnosis not present

## 2017-03-06 DIAGNOSIS — H2513 Age-related nuclear cataract, bilateral: Secondary | ICD-10-CM | POA: Diagnosis not present

## 2017-03-06 DIAGNOSIS — H547 Unspecified visual loss: Secondary | ICD-10-CM | POA: Diagnosis not present

## 2017-03-08 DIAGNOSIS — M7751 Other enthesopathy of right foot: Secondary | ICD-10-CM | POA: Diagnosis not present

## 2017-03-08 DIAGNOSIS — L84 Corns and callosities: Secondary | ICD-10-CM | POA: Diagnosis not present

## 2017-04-10 DIAGNOSIS — Z13220 Encounter for screening for lipoid disorders: Secondary | ICD-10-CM | POA: Diagnosis not present

## 2017-04-10 DIAGNOSIS — Z79890 Hormone replacement therapy (postmenopausal): Secondary | ICD-10-CM | POA: Diagnosis not present

## 2017-04-10 DIAGNOSIS — Z79899 Other long term (current) drug therapy: Secondary | ICD-10-CM | POA: Diagnosis not present

## 2017-04-10 DIAGNOSIS — Z780 Asymptomatic menopausal state: Secondary | ICD-10-CM | POA: Diagnosis not present

## 2017-04-10 DIAGNOSIS — E039 Hypothyroidism, unspecified: Secondary | ICD-10-CM | POA: Diagnosis not present

## 2017-04-19 DIAGNOSIS — M7751 Other enthesopathy of right foot: Secondary | ICD-10-CM | POA: Diagnosis not present

## 2017-04-19 DIAGNOSIS — L84 Corns and callosities: Secondary | ICD-10-CM | POA: Diagnosis not present

## 2017-04-20 DIAGNOSIS — H2511 Age-related nuclear cataract, right eye: Secondary | ICD-10-CM | POA: Diagnosis not present

## 2017-05-03 DIAGNOSIS — L84 Corns and callosities: Secondary | ICD-10-CM | POA: Diagnosis not present

## 2017-05-03 DIAGNOSIS — M7751 Other enthesopathy of right foot: Secondary | ICD-10-CM | POA: Diagnosis not present

## 2017-05-08 DIAGNOSIS — Z96653 Presence of artificial knee joint, bilateral: Secondary | ICD-10-CM | POA: Diagnosis not present

## 2017-05-08 DIAGNOSIS — E039 Hypothyroidism, unspecified: Secondary | ICD-10-CM | POA: Diagnosis not present

## 2017-05-08 DIAGNOSIS — H2511 Age-related nuclear cataract, right eye: Secondary | ICD-10-CM | POA: Diagnosis not present

## 2017-05-08 DIAGNOSIS — H259 Unspecified age-related cataract: Secondary | ICD-10-CM | POA: Diagnosis not present

## 2017-05-08 DIAGNOSIS — K219 Gastro-esophageal reflux disease without esophagitis: Secondary | ICD-10-CM | POA: Diagnosis not present

## 2017-05-08 DIAGNOSIS — Z79899 Other long term (current) drug therapy: Secondary | ICD-10-CM | POA: Diagnosis not present

## 2017-05-08 DIAGNOSIS — M1990 Unspecified osteoarthritis, unspecified site: Secondary | ICD-10-CM | POA: Diagnosis not present

## 2017-05-10 DIAGNOSIS — M7751 Other enthesopathy of right foot: Secondary | ICD-10-CM | POA: Diagnosis not present

## 2017-05-10 DIAGNOSIS — L84 Corns and callosities: Secondary | ICD-10-CM | POA: Diagnosis not present

## 2017-05-22 DIAGNOSIS — H259 Unspecified age-related cataract: Secondary | ICD-10-CM | POA: Diagnosis not present

## 2017-05-22 DIAGNOSIS — H2511 Age-related nuclear cataract, right eye: Secondary | ICD-10-CM | POA: Diagnosis not present

## 2017-05-22 DIAGNOSIS — H2512 Age-related nuclear cataract, left eye: Secondary | ICD-10-CM | POA: Diagnosis not present

## 2017-05-22 DIAGNOSIS — Z79899 Other long term (current) drug therapy: Secondary | ICD-10-CM | POA: Diagnosis not present

## 2017-05-22 DIAGNOSIS — E039 Hypothyroidism, unspecified: Secondary | ICD-10-CM | POA: Diagnosis not present

## 2017-05-22 DIAGNOSIS — K219 Gastro-esophageal reflux disease without esophagitis: Secondary | ICD-10-CM | POA: Diagnosis not present

## 2017-06-12 DIAGNOSIS — E039 Hypothyroidism, unspecified: Secondary | ICD-10-CM | POA: Diagnosis not present

## 2017-07-03 DIAGNOSIS — J0100 Acute maxillary sinusitis, unspecified: Secondary | ICD-10-CM | POA: Diagnosis not present

## 2017-07-10 DIAGNOSIS — M5416 Radiculopathy, lumbar region: Secondary | ICD-10-CM | POA: Diagnosis not present

## 2017-07-10 DIAGNOSIS — Z96651 Presence of right artificial knee joint: Secondary | ICD-10-CM | POA: Diagnosis not present

## 2017-07-12 DIAGNOSIS — M545 Low back pain: Secondary | ICD-10-CM | POA: Diagnosis not present

## 2017-07-12 DIAGNOSIS — M5126 Other intervertebral disc displacement, lumbar region: Secondary | ICD-10-CM | POA: Diagnosis not present

## 2017-07-12 DIAGNOSIS — M5416 Radiculopathy, lumbar region: Secondary | ICD-10-CM | POA: Diagnosis not present

## 2017-07-12 DIAGNOSIS — M4686 Other specified inflammatory spondylopathies, lumbar region: Secondary | ICD-10-CM | POA: Diagnosis not present

## 2017-07-12 DIAGNOSIS — M5136 Other intervertebral disc degeneration, lumbar region: Secondary | ICD-10-CM | POA: Diagnosis not present

## 2017-07-12 DIAGNOSIS — M79661 Pain in right lower leg: Secondary | ICD-10-CM | POA: Diagnosis not present

## 2017-07-12 DIAGNOSIS — M5116 Intervertebral disc disorders with radiculopathy, lumbar region: Secondary | ICD-10-CM | POA: Diagnosis not present

## 2017-07-12 DIAGNOSIS — M48061 Spinal stenosis, lumbar region without neurogenic claudication: Secondary | ICD-10-CM | POA: Diagnosis not present

## 2017-07-31 DIAGNOSIS — E782 Mixed hyperlipidemia: Secondary | ICD-10-CM | POA: Diagnosis not present

## 2017-07-31 DIAGNOSIS — M6281 Muscle weakness (generalized): Secondary | ICD-10-CM | POA: Diagnosis not present

## 2017-07-31 DIAGNOSIS — E039 Hypothyroidism, unspecified: Secondary | ICD-10-CM | POA: Diagnosis not present

## 2017-07-31 DIAGNOSIS — E2839 Other primary ovarian failure: Secondary | ICD-10-CM | POA: Diagnosis not present

## 2017-08-03 DIAGNOSIS — R030 Elevated blood-pressure reading, without diagnosis of hypertension: Secondary | ICD-10-CM | POA: Diagnosis not present

## 2017-08-03 DIAGNOSIS — M48062 Spinal stenosis, lumbar region with neurogenic claudication: Secondary | ICD-10-CM | POA: Diagnosis not present

## 2017-08-03 DIAGNOSIS — M4316 Spondylolisthesis, lumbar region: Secondary | ICD-10-CM | POA: Diagnosis not present

## 2017-08-08 DIAGNOSIS — M5441 Lumbago with sciatica, right side: Secondary | ICD-10-CM | POA: Diagnosis not present

## 2017-08-08 DIAGNOSIS — R2689 Other abnormalities of gait and mobility: Secondary | ICD-10-CM | POA: Diagnosis not present

## 2017-08-08 DIAGNOSIS — M62551 Muscle wasting and atrophy, not elsewhere classified, right thigh: Secondary | ICD-10-CM | POA: Diagnosis not present

## 2017-08-14 DIAGNOSIS — M5441 Lumbago with sciatica, right side: Secondary | ICD-10-CM | POA: Diagnosis not present

## 2017-08-14 DIAGNOSIS — M62551 Muscle wasting and atrophy, not elsewhere classified, right thigh: Secondary | ICD-10-CM | POA: Diagnosis not present

## 2017-08-14 DIAGNOSIS — R2689 Other abnormalities of gait and mobility: Secondary | ICD-10-CM | POA: Diagnosis not present

## 2017-08-16 DIAGNOSIS — M62551 Muscle wasting and atrophy, not elsewhere classified, right thigh: Secondary | ICD-10-CM | POA: Diagnosis not present

## 2017-08-16 DIAGNOSIS — M5441 Lumbago with sciatica, right side: Secondary | ICD-10-CM | POA: Diagnosis not present

## 2017-08-16 DIAGNOSIS — R2689 Other abnormalities of gait and mobility: Secondary | ICD-10-CM | POA: Diagnosis not present

## 2017-08-21 DIAGNOSIS — M62551 Muscle wasting and atrophy, not elsewhere classified, right thigh: Secondary | ICD-10-CM | POA: Diagnosis not present

## 2017-08-21 DIAGNOSIS — M5441 Lumbago with sciatica, right side: Secondary | ICD-10-CM | POA: Diagnosis not present

## 2017-08-21 DIAGNOSIS — R2689 Other abnormalities of gait and mobility: Secondary | ICD-10-CM | POA: Diagnosis not present

## 2017-08-23 DIAGNOSIS — R2689 Other abnormalities of gait and mobility: Secondary | ICD-10-CM | POA: Diagnosis not present

## 2017-08-23 DIAGNOSIS — M5441 Lumbago with sciatica, right side: Secondary | ICD-10-CM | POA: Diagnosis not present

## 2017-08-23 DIAGNOSIS — M62551 Muscle wasting and atrophy, not elsewhere classified, right thigh: Secondary | ICD-10-CM | POA: Diagnosis not present

## 2017-08-28 DIAGNOSIS — R2689 Other abnormalities of gait and mobility: Secondary | ICD-10-CM | POA: Diagnosis not present

## 2017-08-28 DIAGNOSIS — M5441 Lumbago with sciatica, right side: Secondary | ICD-10-CM | POA: Diagnosis not present

## 2017-08-28 DIAGNOSIS — M62551 Muscle wasting and atrophy, not elsewhere classified, right thigh: Secondary | ICD-10-CM | POA: Diagnosis not present

## 2017-09-04 DIAGNOSIS — M5441 Lumbago with sciatica, right side: Secondary | ICD-10-CM | POA: Diagnosis not present

## 2017-09-04 DIAGNOSIS — M62551 Muscle wasting and atrophy, not elsewhere classified, right thigh: Secondary | ICD-10-CM | POA: Diagnosis not present

## 2017-09-04 DIAGNOSIS — R2689 Other abnormalities of gait and mobility: Secondary | ICD-10-CM | POA: Diagnosis not present

## 2017-09-06 DIAGNOSIS — M62551 Muscle wasting and atrophy, not elsewhere classified, right thigh: Secondary | ICD-10-CM | POA: Diagnosis not present

## 2017-09-06 DIAGNOSIS — R2689 Other abnormalities of gait and mobility: Secondary | ICD-10-CM | POA: Diagnosis not present

## 2017-09-06 DIAGNOSIS — M5441 Lumbago with sciatica, right side: Secondary | ICD-10-CM | POA: Diagnosis not present

## 2017-09-10 DIAGNOSIS — R2689 Other abnormalities of gait and mobility: Secondary | ICD-10-CM | POA: Diagnosis not present

## 2017-09-10 DIAGNOSIS — M5441 Lumbago with sciatica, right side: Secondary | ICD-10-CM | POA: Diagnosis not present

## 2017-09-10 DIAGNOSIS — M62551 Muscle wasting and atrophy, not elsewhere classified, right thigh: Secondary | ICD-10-CM | POA: Diagnosis not present

## 2017-09-12 DIAGNOSIS — R2689 Other abnormalities of gait and mobility: Secondary | ICD-10-CM | POA: Diagnosis not present

## 2017-09-12 DIAGNOSIS — M62551 Muscle wasting and atrophy, not elsewhere classified, right thigh: Secondary | ICD-10-CM | POA: Diagnosis not present

## 2017-09-12 DIAGNOSIS — M5441 Lumbago with sciatica, right side: Secondary | ICD-10-CM | POA: Diagnosis not present

## 2017-09-20 DIAGNOSIS — M9901 Segmental and somatic dysfunction of cervical region: Secondary | ICD-10-CM | POA: Diagnosis not present

## 2017-09-20 DIAGNOSIS — M545 Low back pain: Secondary | ICD-10-CM | POA: Diagnosis not present

## 2017-09-20 DIAGNOSIS — M546 Pain in thoracic spine: Secondary | ICD-10-CM | POA: Diagnosis not present

## 2017-09-20 DIAGNOSIS — M5413 Radiculopathy, cervicothoracic region: Secondary | ICD-10-CM | POA: Diagnosis not present

## 2017-09-20 DIAGNOSIS — M9903 Segmental and somatic dysfunction of lumbar region: Secondary | ICD-10-CM | POA: Diagnosis not present

## 2017-09-20 DIAGNOSIS — M9904 Segmental and somatic dysfunction of sacral region: Secondary | ICD-10-CM | POA: Diagnosis not present

## 2017-09-20 DIAGNOSIS — M5136 Other intervertebral disc degeneration, lumbar region: Secondary | ICD-10-CM | POA: Diagnosis not present

## 2017-09-21 DIAGNOSIS — M5136 Other intervertebral disc degeneration, lumbar region: Secondary | ICD-10-CM | POA: Diagnosis not present

## 2017-09-21 DIAGNOSIS — M5413 Radiculopathy, cervicothoracic region: Secondary | ICD-10-CM | POA: Diagnosis not present

## 2017-09-21 DIAGNOSIS — M545 Low back pain: Secondary | ICD-10-CM | POA: Diagnosis not present

## 2017-09-21 DIAGNOSIS — M546 Pain in thoracic spine: Secondary | ICD-10-CM | POA: Diagnosis not present

## 2017-09-21 DIAGNOSIS — M9903 Segmental and somatic dysfunction of lumbar region: Secondary | ICD-10-CM | POA: Diagnosis not present

## 2017-09-21 DIAGNOSIS — M9904 Segmental and somatic dysfunction of sacral region: Secondary | ICD-10-CM | POA: Diagnosis not present

## 2017-09-21 DIAGNOSIS — M9901 Segmental and somatic dysfunction of cervical region: Secondary | ICD-10-CM | POA: Diagnosis not present

## 2017-09-24 DIAGNOSIS — M9904 Segmental and somatic dysfunction of sacral region: Secondary | ICD-10-CM | POA: Diagnosis not present

## 2017-09-24 DIAGNOSIS — M5136 Other intervertebral disc degeneration, lumbar region: Secondary | ICD-10-CM | POA: Diagnosis not present

## 2017-09-24 DIAGNOSIS — M9901 Segmental and somatic dysfunction of cervical region: Secondary | ICD-10-CM | POA: Diagnosis not present

## 2017-09-24 DIAGNOSIS — M9903 Segmental and somatic dysfunction of lumbar region: Secondary | ICD-10-CM | POA: Diagnosis not present

## 2017-09-24 DIAGNOSIS — M545 Low back pain: Secondary | ICD-10-CM | POA: Diagnosis not present

## 2017-09-24 DIAGNOSIS — M5413 Radiculopathy, cervicothoracic region: Secondary | ICD-10-CM | POA: Diagnosis not present

## 2017-09-24 DIAGNOSIS — M546 Pain in thoracic spine: Secondary | ICD-10-CM | POA: Diagnosis not present

## 2017-09-26 DIAGNOSIS — M9903 Segmental and somatic dysfunction of lumbar region: Secondary | ICD-10-CM | POA: Diagnosis not present

## 2017-09-26 DIAGNOSIS — M5413 Radiculopathy, cervicothoracic region: Secondary | ICD-10-CM | POA: Diagnosis not present

## 2017-09-26 DIAGNOSIS — M9901 Segmental and somatic dysfunction of cervical region: Secondary | ICD-10-CM | POA: Diagnosis not present

## 2017-09-26 DIAGNOSIS — M9904 Segmental and somatic dysfunction of sacral region: Secondary | ICD-10-CM | POA: Diagnosis not present

## 2017-09-26 DIAGNOSIS — M5136 Other intervertebral disc degeneration, lumbar region: Secondary | ICD-10-CM | POA: Diagnosis not present

## 2017-09-26 DIAGNOSIS — M546 Pain in thoracic spine: Secondary | ICD-10-CM | POA: Diagnosis not present

## 2017-09-26 DIAGNOSIS — M545 Low back pain: Secondary | ICD-10-CM | POA: Diagnosis not present

## 2017-09-27 DIAGNOSIS — M9903 Segmental and somatic dysfunction of lumbar region: Secondary | ICD-10-CM | POA: Diagnosis not present

## 2017-09-27 DIAGNOSIS — M5136 Other intervertebral disc degeneration, lumbar region: Secondary | ICD-10-CM | POA: Diagnosis not present

## 2017-09-27 DIAGNOSIS — M5413 Radiculopathy, cervicothoracic region: Secondary | ICD-10-CM | POA: Diagnosis not present

## 2017-09-27 DIAGNOSIS — M546 Pain in thoracic spine: Secondary | ICD-10-CM | POA: Diagnosis not present

## 2017-09-27 DIAGNOSIS — M545 Low back pain: Secondary | ICD-10-CM | POA: Diagnosis not present

## 2017-09-27 DIAGNOSIS — M9904 Segmental and somatic dysfunction of sacral region: Secondary | ICD-10-CM | POA: Diagnosis not present

## 2017-09-27 DIAGNOSIS — M9901 Segmental and somatic dysfunction of cervical region: Secondary | ICD-10-CM | POA: Diagnosis not present

## 2017-10-01 DIAGNOSIS — M9903 Segmental and somatic dysfunction of lumbar region: Secondary | ICD-10-CM | POA: Diagnosis not present

## 2017-10-01 DIAGNOSIS — M546 Pain in thoracic spine: Secondary | ICD-10-CM | POA: Diagnosis not present

## 2017-10-01 DIAGNOSIS — M545 Low back pain: Secondary | ICD-10-CM | POA: Diagnosis not present

## 2017-10-01 DIAGNOSIS — M5413 Radiculopathy, cervicothoracic region: Secondary | ICD-10-CM | POA: Diagnosis not present

## 2017-10-01 DIAGNOSIS — M9901 Segmental and somatic dysfunction of cervical region: Secondary | ICD-10-CM | POA: Diagnosis not present

## 2017-10-01 DIAGNOSIS — M9904 Segmental and somatic dysfunction of sacral region: Secondary | ICD-10-CM | POA: Diagnosis not present

## 2017-10-01 DIAGNOSIS — M5136 Other intervertebral disc degeneration, lumbar region: Secondary | ICD-10-CM | POA: Diagnosis not present

## 2017-10-03 DIAGNOSIS — M5136 Other intervertebral disc degeneration, lumbar region: Secondary | ICD-10-CM | POA: Diagnosis not present

## 2017-10-03 DIAGNOSIS — M545 Low back pain: Secondary | ICD-10-CM | POA: Diagnosis not present

## 2017-10-03 DIAGNOSIS — M9903 Segmental and somatic dysfunction of lumbar region: Secondary | ICD-10-CM | POA: Diagnosis not present

## 2017-10-03 DIAGNOSIS — M546 Pain in thoracic spine: Secondary | ICD-10-CM | POA: Diagnosis not present

## 2017-10-03 DIAGNOSIS — M9901 Segmental and somatic dysfunction of cervical region: Secondary | ICD-10-CM | POA: Diagnosis not present

## 2017-10-03 DIAGNOSIS — M5413 Radiculopathy, cervicothoracic region: Secondary | ICD-10-CM | POA: Diagnosis not present

## 2017-10-03 DIAGNOSIS — M9904 Segmental and somatic dysfunction of sacral region: Secondary | ICD-10-CM | POA: Diagnosis not present

## 2017-10-05 DIAGNOSIS — M5136 Other intervertebral disc degeneration, lumbar region: Secondary | ICD-10-CM | POA: Diagnosis not present

## 2017-10-05 DIAGNOSIS — M545 Low back pain: Secondary | ICD-10-CM | POA: Diagnosis not present

## 2017-10-05 DIAGNOSIS — M5413 Radiculopathy, cervicothoracic region: Secondary | ICD-10-CM | POA: Diagnosis not present

## 2017-10-05 DIAGNOSIS — M9904 Segmental and somatic dysfunction of sacral region: Secondary | ICD-10-CM | POA: Diagnosis not present

## 2017-10-05 DIAGNOSIS — M9901 Segmental and somatic dysfunction of cervical region: Secondary | ICD-10-CM | POA: Diagnosis not present

## 2017-10-05 DIAGNOSIS — M9903 Segmental and somatic dysfunction of lumbar region: Secondary | ICD-10-CM | POA: Diagnosis not present

## 2017-10-05 DIAGNOSIS — M546 Pain in thoracic spine: Secondary | ICD-10-CM | POA: Diagnosis not present

## 2017-10-08 DIAGNOSIS — M5136 Other intervertebral disc degeneration, lumbar region: Secondary | ICD-10-CM | POA: Diagnosis not present

## 2017-10-08 DIAGNOSIS — M546 Pain in thoracic spine: Secondary | ICD-10-CM | POA: Diagnosis not present

## 2017-10-08 DIAGNOSIS — M9904 Segmental and somatic dysfunction of sacral region: Secondary | ICD-10-CM | POA: Diagnosis not present

## 2017-10-08 DIAGNOSIS — M9901 Segmental and somatic dysfunction of cervical region: Secondary | ICD-10-CM | POA: Diagnosis not present

## 2017-10-08 DIAGNOSIS — M5413 Radiculopathy, cervicothoracic region: Secondary | ICD-10-CM | POA: Diagnosis not present

## 2017-10-08 DIAGNOSIS — M9903 Segmental and somatic dysfunction of lumbar region: Secondary | ICD-10-CM | POA: Diagnosis not present

## 2017-10-08 DIAGNOSIS — M545 Low back pain: Secondary | ICD-10-CM | POA: Diagnosis not present

## 2017-10-12 DIAGNOSIS — M9904 Segmental and somatic dysfunction of sacral region: Secondary | ICD-10-CM | POA: Diagnosis not present

## 2017-10-12 DIAGNOSIS — M5413 Radiculopathy, cervicothoracic region: Secondary | ICD-10-CM | POA: Diagnosis not present

## 2017-10-12 DIAGNOSIS — M5136 Other intervertebral disc degeneration, lumbar region: Secondary | ICD-10-CM | POA: Diagnosis not present

## 2017-10-12 DIAGNOSIS — M9903 Segmental and somatic dysfunction of lumbar region: Secondary | ICD-10-CM | POA: Diagnosis not present

## 2017-10-12 DIAGNOSIS — M546 Pain in thoracic spine: Secondary | ICD-10-CM | POA: Diagnosis not present

## 2017-10-12 DIAGNOSIS — M545 Low back pain: Secondary | ICD-10-CM | POA: Diagnosis not present

## 2017-10-12 DIAGNOSIS — M9901 Segmental and somatic dysfunction of cervical region: Secondary | ICD-10-CM | POA: Diagnosis not present

## 2017-10-18 DIAGNOSIS — E039 Hypothyroidism, unspecified: Secondary | ICD-10-CM | POA: Diagnosis not present

## 2017-10-18 DIAGNOSIS — R531 Weakness: Secondary | ICD-10-CM | POA: Diagnosis not present

## 2017-10-18 DIAGNOSIS — E782 Mixed hyperlipidemia: Secondary | ICD-10-CM | POA: Diagnosis not present

## 2017-10-18 DIAGNOSIS — E2839 Other primary ovarian failure: Secondary | ICD-10-CM | POA: Diagnosis not present

## 2017-10-26 DIAGNOSIS — M9904 Segmental and somatic dysfunction of sacral region: Secondary | ICD-10-CM | POA: Diagnosis not present

## 2017-10-26 DIAGNOSIS — M9903 Segmental and somatic dysfunction of lumbar region: Secondary | ICD-10-CM | POA: Diagnosis not present

## 2017-10-26 DIAGNOSIS — M9902 Segmental and somatic dysfunction of thoracic region: Secondary | ICD-10-CM | POA: Diagnosis not present

## 2017-10-26 DIAGNOSIS — M9905 Segmental and somatic dysfunction of pelvic region: Secondary | ICD-10-CM | POA: Diagnosis not present

## 2017-10-30 DIAGNOSIS — M9904 Segmental and somatic dysfunction of sacral region: Secondary | ICD-10-CM | POA: Diagnosis not present

## 2017-10-30 DIAGNOSIS — M9902 Segmental and somatic dysfunction of thoracic region: Secondary | ICD-10-CM | POA: Diagnosis not present

## 2017-10-30 DIAGNOSIS — M9905 Segmental and somatic dysfunction of pelvic region: Secondary | ICD-10-CM | POA: Diagnosis not present

## 2017-10-30 DIAGNOSIS — M9903 Segmental and somatic dysfunction of lumbar region: Secondary | ICD-10-CM | POA: Diagnosis not present

## 2017-11-01 DIAGNOSIS — K219 Gastro-esophageal reflux disease without esophagitis: Secondary | ICD-10-CM | POA: Diagnosis not present

## 2017-11-01 DIAGNOSIS — E782 Mixed hyperlipidemia: Secondary | ICD-10-CM | POA: Diagnosis not present

## 2017-11-01 DIAGNOSIS — Z79899 Other long term (current) drug therapy: Secondary | ICD-10-CM | POA: Diagnosis not present

## 2017-11-01 DIAGNOSIS — Z23 Encounter for immunization: Secondary | ICD-10-CM | POA: Diagnosis not present

## 2017-11-01 DIAGNOSIS — E039 Hypothyroidism, unspecified: Secondary | ICD-10-CM | POA: Diagnosis not present

## 2017-12-06 DIAGNOSIS — R197 Diarrhea, unspecified: Secondary | ICD-10-CM | POA: Diagnosis not present

## 2017-12-06 DIAGNOSIS — R143 Flatulence: Secondary | ICD-10-CM | POA: Diagnosis not present

## 2017-12-06 DIAGNOSIS — R1031 Right lower quadrant pain: Secondary | ICD-10-CM | POA: Diagnosis not present

## 2017-12-07 DIAGNOSIS — R197 Diarrhea, unspecified: Secondary | ICD-10-CM | POA: Diagnosis not present

## 2017-12-18 DIAGNOSIS — Z6825 Body mass index (BMI) 25.0-25.9, adult: Secondary | ICD-10-CM | POA: Diagnosis not present

## 2017-12-18 DIAGNOSIS — R030 Elevated blood-pressure reading, without diagnosis of hypertension: Secondary | ICD-10-CM | POA: Diagnosis not present

## 2017-12-18 DIAGNOSIS — M4316 Spondylolisthesis, lumbar region: Secondary | ICD-10-CM | POA: Diagnosis not present

## 2018-01-01 DIAGNOSIS — Z1239 Encounter for other screening for malignant neoplasm of breast: Secondary | ICD-10-CM | POA: Diagnosis not present

## 2018-01-01 DIAGNOSIS — Z90710 Acquired absence of both cervix and uterus: Secondary | ICD-10-CM | POA: Diagnosis not present

## 2018-01-01 DIAGNOSIS — Z01419 Encounter for gynecological examination (general) (routine) without abnormal findings: Secondary | ICD-10-CM | POA: Diagnosis not present

## 2018-01-03 DIAGNOSIS — H26493 Other secondary cataract, bilateral: Secondary | ICD-10-CM | POA: Diagnosis not present

## 2018-01-03 DIAGNOSIS — Z961 Presence of intraocular lens: Secondary | ICD-10-CM | POA: Diagnosis not present

## 2018-01-03 DIAGNOSIS — H353123 Nonexudative age-related macular degeneration, left eye, advanced atrophic without subfoveal involvement: Secondary | ICD-10-CM | POA: Diagnosis not present

## 2018-01-03 DIAGNOSIS — H353112 Nonexudative age-related macular degeneration, right eye, intermediate dry stage: Secondary | ICD-10-CM | POA: Diagnosis not present

## 2018-01-08 ENCOUNTER — Other Ambulatory Visit: Payer: Self-pay | Admitting: Neurosurgery

## 2018-01-18 NOTE — Pre-Procedure Instructions (Signed)
Molly Ferrell  01/18/2018      [facility] - [place], [place] - [place] [address] Phone: [phone] Fax: [phone]    Your procedure is scheduled on Dec.4  Report to [facility] Admitting at 10:50  A.M.  Call this number if you have problems the morning of surgery:  [phone]   Remember:  Do not eat or drink after midnight.      Take these medicines the morning of surgery with A SIP OF WATER :              Estradiol (estrace)             Omeprazole (prilosec)             Thyroid (armour)             7 days prior to surgery STOP taking any Aspirin(unless otherwise instructed by your surgeon), Aleve, Naproxen, Ibuprofen, Motrin, Advil, Goody's, BC's, all herbal medications, fish oil, and all vitamins             Follow your surgeon's instructions on when to stop Asprin.  If no instructions were given by your surgeon then you will need to call the office to get those instructions.      Do not wear jewelry, make-up or nail polish.  Do not wear lotions, powders, or perfumes, or deodorant.  Do not shave 48 hours prior to surgery.  Men may shave face and neck.  Do not bring valuables to the hospital.  [facility] is not responsible for any belongings or valuables.  Contacts, dentures or bridgework may not be worn into surgery.  Leave your suitcase in the car.  After surgery it may be brought to your room.  For patients admitted to the hospital, discharge time will be determined by your treatment team.  Patients discharged the day of surgery will not be allowed to drive home.    Special instructions:   [facility]- Preparing For Surgery  Before surgery, you can play an important role. Because skin is not sterile, your skin needs to be as free of germs as possible. You can reduce the number of germs on your skin by washing with CHG (chlorahexidine gluconate) Soap before surgery.  CHG is an antiseptic cleaner  which kills germs and bonds with the skin to continue killing germs even after washing.    Oral Hygiene is also important to reduce your risk of infection.  Remember - BRUSH YOUR TEETH THE MORNING OF SURGERY WITH YOUR REGULAR TOOTHPASTE  Please do not use if you have an allergy to CHG or antibacterial soaps. If your skin becomes reddened/irritated stop using the CHG.  Do not shave (including legs and underarms) for at least 48 hours prior to first CHG shower. It is OK to shave your face.  Please follow these instructions carefully.   1. Shower the NIGHT BEFORE SURGERY and the MORNING OF SURGERY with CHG.   2. If you chose to wash your hair, wash your hair first as usual with your normal shampoo.  3. After you shampoo, rinse your hair and body thoroughly to remove the shampoo.  4. Use CHG as you would any other liquid soap. You can apply CHG directly to the skin and wash gently with a scrungie or a clean washcloth.   5. Apply the CHG Soap to your body ONLY FROM THE NECK DOWN.  Do not use on  open wounds or open sores. Avoid contact with your eyes, ears, mouth and genitals (private parts). Wash Face and genitals (private parts)  with your normal soap.  6. Wash thoroughly, paying special attention to the area where your surgery will be performed.  7. Thoroughly rinse your body with warm water from the neck down.  8. DO NOT shower/wash with your normal soap after using and rinsing off the CHG Soap.  9. Pat yourself dry with a CLEAN TOWEL.  10. Wear CLEAN PAJAMAS to bed the night before surgery, wear comfortable clothes the morning of surgery  11. Place CLEAN SHEETS on your bed the night of your first shower and DO NOT SLEEP WITH PETS.    Day of Surgery:  Do not apply any deodorants/lotions.  Please wear clean clothes to the hospital/surgery center.   Remember to brush your teeth WITH YOUR REGULAR TOOTHPASTE.    Please read over the following fact sheets that you were  given. Coughing and Deep Breathing, MRSA Information and Surgical Site Infection Prevention

## 2018-01-21 ENCOUNTER — Other Ambulatory Visit: Payer: Self-pay

## 2018-01-21 ENCOUNTER — Encounter (HOSPITAL_COMMUNITY)
Admission: RE | Admit: 2018-01-21 | Discharge: 2018-01-21 | Disposition: A | Discharge status: Home / Self Care | Payer: PPO | Source: Ambulatory Visit | Attending: Neurosurgery | Admitting: Neurosurgery

## 2018-01-21 ENCOUNTER — Encounter (HOSPITAL_COMMUNITY): Payer: Self-pay

## 2018-01-21 DIAGNOSIS — Z01818 Encounter for other preprocedural examination: Secondary | ICD-10-CM | POA: Insufficient documentation

## 2018-01-21 DIAGNOSIS — I4510 Unspecified right bundle-branch block: Secondary | ICD-10-CM | POA: Insufficient documentation

## 2018-01-21 LAB — TYPE AND SCREEN
ABO/RH(D): A POS
Antibody Screen: NEGATIVE

## 2018-01-21 LAB — BASIC METABOLIC PANEL
Anion gap: 8 (ref 5–15)
BUN: 16 mg/dL (ref 8–23)
CO2: 23 mmol/L (ref 22–32)
Calcium: 9.8 mg/dL (ref 8.9–10.3)
Chloride: 109 mmol/L (ref 98–111)
Creatinine, Ser: 0.96 mg/dL (ref 0.44–1.00)
GFR calc Af Amer: >60 mL/min (ref >60)
GFR calc non Af Amer: 59 mL/min — ABNORMAL LOW (ref >60)
Glucose, Bld: 94 mg/dL (ref 70–99)
Potassium: 4.1 mmol/L (ref 3.5–5.1)
Sodium: 140 mmol/L (ref 135–145)

## 2018-01-21 LAB — CBC
HCT: 51 % — ABNORMAL HIGH (ref 36.0–46.0)
Hemoglobin: 16.5 g/dL — ABNORMAL HIGH (ref 12.0–15.0)
MCH: 29.7 pg (ref 26.0–34.0)
MCHC: 32.4 g/dL (ref 30.0–36.0)
MCV: 91.7 fL (ref 80.0–100.0)
PLATELETS: 222 10*3/uL (ref 150–400)
RBC: 5.56 MIL/uL — ABNORMAL HIGH (ref 3.87–5.11)
RDW: 12.5 % (ref 11.5–15.5)
WBC: 8.6 10*3/uL (ref 4.0–10.5)
nRBC: 0 % (ref 0.0–0.2)

## 2018-01-21 LAB — SURGICAL PCR SCREEN
MRSA, PCR: NEGATIVE
Staphylococcus aureus: NEGATIVE

## 2018-01-21 NOTE — Progress Notes (Signed)
PCP - Dr. Gilford Rile Cardiologist - patient denies  Chest x-ray - n/a EKG - 01/21/2018 Stress Test - 07/03/14 (in media 06/14/15) ECHO - patient denies Cardiac Cath - patient denies  Sleep Study - patient denies  Blood Thinner Instructions: Aspirin Instructions: patient instructed last dose 01/25/2018  Anesthesia review: yes, abnormal EKG, hx of stress test  Patient denies shortness of breath, fever, cough and chest pain at PAT appointment   Patient verbalized understanding of instructions that were given to them at the PAT appointment. Patient was also instructed that they will need to review over the PAT instructions again at home before surgery.

## 2018-01-22 DIAGNOSIS — R1031 Right lower quadrant pain: Secondary | ICD-10-CM | POA: Diagnosis not present

## 2018-01-22 DIAGNOSIS — R143 Flatulence: Secondary | ICD-10-CM | POA: Diagnosis not present

## 2018-01-22 DIAGNOSIS — R197 Diarrhea, unspecified: Secondary | ICD-10-CM | POA: Diagnosis not present

## 2018-01-22 NOTE — Anesthesia Preprocedure Evaluation (Addendum)
Anesthesia Evaluation    Reviewed: Allergy & Precautions, Patient's Chart, lab work & pertinent test results  History of Anesthesia Complications (+) history of anesthetic complications  Airway Mallampati: II  TM Distance: >3 FB Neck ROM: Full    Dental no notable dental hx. (+) Teeth Intact, Caps   Pulmonary neg pulmonary ROS, former smoker,    Pulmonary exam normal breath sounds clear to auscultation       Cardiovascular negative cardio ROS Normal cardiovascular exam Rate:Normal     Neuro/Psych Macular Degeneration negative psych ROS   GI/Hepatic Neg liver ROS, GERD  Medicated and Controlled,  Endo/Other  Hypothyroidism Hyperlipidemia  Renal/GU negative Renal ROS     Musculoskeletal  (+) Arthritis , Osteoarthritis,  Spondylolisthesis lumbar spine   Abdominal   Peds  Hematology negative hematology ROS (+)   Anesthesia Other Findings Day of surgery medications reviewed with the patient.  Reproductive/Obstetrics                           Anesthesia Physical Anesthesia Plan  ASA: II  Anesthesia Plan: General   Post-op Pain Management:    Induction: Intravenous  PONV Risk Score and Plan: 3 and Ondansetron, Dexamethasone and Treatment may vary due to age or medical condition  Airway Management Planned: Oral ETT  Additional Equipment:   Intra-op Plan:   Post-operative Plan: Extubation in OR  Informed Consent: I have reviewed the patients History and Physical, chart, labs and discussed the procedure including the risks, benefits and alternatives for the proposed anesthesia with the patient or authorized representative who has indicated his/her understanding and acceptance.   Dental advisory given  Plan Discussed with: CRNA and Surgeon  Anesthesia Plan Comments: (Lexiscan cardiolite 07/04/14 showed no reversible ischemia or infarction. Normal LV wall motion. LVEF 58%. Low-risk stress  test findings.)      Anesthesia Quick Evaluation

## 2018-01-29 ENCOUNTER — Other Ambulatory Visit: Payer: Self-pay | Admitting: Neurosurgery

## 2018-01-29 ENCOUNTER — Encounter (HOSPITAL_COMMUNITY): Payer: Self-pay | Admitting: Anesthesiology

## 2018-01-30 ENCOUNTER — Inpatient Hospital Stay (HOSPITAL_COMMUNITY): Payer: PPO | Admitting: Anesthesiology

## 2018-01-30 ENCOUNTER — Inpatient Hospital Stay (HOSPITAL_COMMUNITY): Payer: PPO

## 2018-01-30 ENCOUNTER — Encounter (HOSPITAL_COMMUNITY): Payer: Self-pay

## 2018-01-30 ENCOUNTER — Inpatient Hospital Stay (HOSPITAL_COMMUNITY)
Admission: RE | Admit: 2018-01-30 | Discharge: 2018-01-31 | DRG: 455 | Disposition: A | Discharge status: Home / Self Care | Payer: PPO | Source: Ambulatory Visit | Attending: Neurosurgery | Admitting: Neurosurgery

## 2018-01-30 ENCOUNTER — Encounter (HOSPITAL_COMMUNITY)
Admission: RE | Disposition: A | Discharge status: Home / Self Care | Payer: Self-pay | Source: Ambulatory Visit | Attending: Neurosurgery

## 2018-01-30 ENCOUNTER — Inpatient Hospital Stay (HOSPITAL_COMMUNITY): Payer: PPO | Admitting: Physician Assistant

## 2018-01-30 ENCOUNTER — Other Ambulatory Visit: Payer: Self-pay

## 2018-01-30 DIAGNOSIS — Z961 Presence of intraocular lens: Secondary | ICD-10-CM | POA: Diagnosis not present

## 2018-01-30 DIAGNOSIS — Z7982 Long term (current) use of aspirin: Secondary | ICD-10-CM | POA: Diagnosis not present

## 2018-01-30 DIAGNOSIS — Z9842 Cataract extraction status, left eye: Secondary | ICD-10-CM | POA: Diagnosis not present

## 2018-01-30 DIAGNOSIS — Z9841 Cataract extraction status, right eye: Secondary | ICD-10-CM

## 2018-01-30 DIAGNOSIS — Z8249 Family history of ischemic heart disease and other diseases of the circulatory system: Secondary | ICD-10-CM | POA: Diagnosis not present

## 2018-01-30 DIAGNOSIS — H3530 Unspecified macular degeneration: Secondary | ICD-10-CM | POA: Diagnosis present

## 2018-01-30 DIAGNOSIS — Z87891 Personal history of nicotine dependence: Secondary | ICD-10-CM

## 2018-01-30 DIAGNOSIS — Z79890 Hormone replacement therapy (postmenopausal): Secondary | ICD-10-CM | POA: Diagnosis not present

## 2018-01-30 DIAGNOSIS — M5116 Intervertebral disc disorders with radiculopathy, lumbar region: Secondary | ICD-10-CM | POA: Diagnosis present

## 2018-01-30 DIAGNOSIS — Z96653 Presence of artificial knee joint, bilateral: Secondary | ICD-10-CM | POA: Diagnosis present

## 2018-01-30 DIAGNOSIS — K219 Gastro-esophageal reflux disease without esophagitis: Secondary | ICD-10-CM | POA: Diagnosis present

## 2018-01-30 DIAGNOSIS — E7800 Pure hypercholesterolemia, unspecified: Secondary | ICD-10-CM | POA: Diagnosis present

## 2018-01-30 DIAGNOSIS — E781 Pure hyperglyceridemia: Secondary | ICD-10-CM | POA: Diagnosis not present

## 2018-01-30 DIAGNOSIS — M4316 Spondylolisthesis, lumbar region: Secondary | ICD-10-CM | POA: Diagnosis present

## 2018-01-30 DIAGNOSIS — E039 Hypothyroidism, unspecified: Secondary | ICD-10-CM | POA: Diagnosis present

## 2018-01-30 DIAGNOSIS — Z880 Allergy status to penicillin: Secondary | ICD-10-CM | POA: Diagnosis not present

## 2018-01-30 DIAGNOSIS — M4726 Other spondylosis with radiculopathy, lumbar region: Secondary | ICD-10-CM | POA: Diagnosis not present

## 2018-01-30 DIAGNOSIS — M545 Low back pain: Secondary | ICD-10-CM | POA: Diagnosis not present

## 2018-01-30 DIAGNOSIS — Z419 Encounter for procedure for purposes other than remedying health state, unspecified: Secondary | ICD-10-CM

## 2018-01-30 DIAGNOSIS — M549 Dorsalgia, unspecified: Secondary | ICD-10-CM | POA: Diagnosis present

## 2018-01-30 DIAGNOSIS — H9190 Unspecified hearing loss, unspecified ear: Secondary | ICD-10-CM | POA: Diagnosis not present

## 2018-01-30 DIAGNOSIS — M4326 Fusion of spine, lumbar region: Secondary | ICD-10-CM | POA: Diagnosis not present

## 2018-01-30 DIAGNOSIS — M48062 Spinal stenosis, lumbar region with neurogenic claudication: Principal | ICD-10-CM | POA: Diagnosis present

## 2018-01-30 HISTORY — DX: Spondylolisthesis, lumbar region: M43.16

## 2018-01-30 SURGERY — POSTERIOR LUMBAR FUSION 1 LEVEL
Anesthesia: General

## 2018-01-30 MED ORDER — ONDANSETRON HCL 4 MG PO TABS: 4.0000 mg | ORAL_TABLET | Freq: Four times a day (QID) | ORAL | Status: DC | PRN

## 2018-01-30 MED ORDER — DOCUSATE SODIUM 100 MG PO CAPS
100.0000 mg | ORAL_CAPSULE | Freq: Two times a day (BID) | ORAL | Status: DC
  Administered 2018-01-30: 100 mg via ORAL
  Filled 2018-01-30 (×2): qty 1

## 2018-01-30 MED ORDER — PANTOPRAZOLE SODIUM 40 MG PO TBEC
80.0000 mg | DELAYED_RELEASE_TABLET | Freq: Every day | ORAL | Status: DC
  Filled 2018-01-30: qty 2

## 2018-01-30 MED ORDER — MIDAZOLAM HCL 5 MG/5ML IJ SOLN
INTRAMUSCULAR | Status: DC | PRN
  Administered 2018-01-30: 2 mg via INTRAVENOUS

## 2018-01-30 MED ORDER — OXYCODONE HCL 5 MG PO TABS: 5.0000 mg | ORAL_TABLET | ORAL | Status: DC | PRN

## 2018-01-30 MED ORDER — THROMBIN 5000 UNITS EX SOLR
CUTANEOUS | Status: AC
  Filled 2018-01-30: qty 5000

## 2018-01-30 MED ORDER — HYDROMORPHONE HCL 1 MG/ML IJ SOLN
INTRAMUSCULAR | Status: AC
  Filled 2018-01-30: qty 0.5

## 2018-01-30 MED ORDER — ONDANSETRON HCL 4 MG/2ML IJ SOLN
INTRAMUSCULAR | Status: AC
  Filled 2018-01-30: qty 2

## 2018-01-30 MED ORDER — VANCOMYCIN HCL IN DEXTROSE 1-5 GM/200ML-% IV SOLN
INTRAVENOUS | Status: AC
  Administered 2018-01-30: 1000 mg via INTRAVENOUS
  Filled 2018-01-30: qty 200

## 2018-01-30 MED ORDER — FENTANYL CITRATE (PF) 250 MCG/5ML IJ SOLN
INTRAMUSCULAR | Status: AC
  Filled 2018-01-30: qty 5

## 2018-01-30 MED ORDER — DEXAMETHASONE SODIUM PHOSPHATE 10 MG/ML IJ SOLN
INTRAMUSCULAR | Status: AC
  Filled 2018-01-30: qty 1

## 2018-01-30 MED ORDER — PHENYLEPHRINE 40 MCG/ML (10ML) SYRINGE FOR IV PUSH (FOR BLOOD PRESSURE SUPPORT)
PREFILLED_SYRINGE | INTRAVENOUS | Status: DC | PRN
  Administered 2018-01-30 (×2): 40 ug via INTRAVENOUS
  Administered 2018-01-30: 80 ug via INTRAVENOUS

## 2018-01-30 MED ORDER — DEXAMETHASONE SODIUM PHOSPHATE 10 MG/ML IJ SOLN
INTRAMUSCULAR | Status: DC | PRN
  Administered 2018-01-30: 10 mg via INTRAVENOUS

## 2018-01-30 MED ORDER — SODIUM CHLORIDE 0.9 % IV SOLN
INTRAVENOUS | Status: DC | PRN
  Administered 2018-01-30: 15 ug/min via INTRAVENOUS

## 2018-01-30 MED ORDER — BUPIVACAINE-EPINEPHRINE (PF) 0.25% -1:200000 IJ SOLN
INTRAMUSCULAR | Status: AC
  Filled 2018-01-30: qty 30

## 2018-01-30 MED ORDER — POLYVINYL ALCOHOL 1.4 % OP SOLN
1.0000 [drp] | Freq: Two times a day (BID) | OPHTHALMIC | Status: DC | PRN
  Filled 2018-01-30: qty 15

## 2018-01-30 MED ORDER — VITAMIN D 25 MCG (1000 UNIT) PO TABS
2000.0000 [IU] | ORAL_TABLET | Freq: Every day | ORAL | Status: DC
  Administered 2018-01-30: 2000 [IU] via ORAL
  Filled 2018-01-30 (×2): qty 2

## 2018-01-30 MED ORDER — PROPOFOL 10 MG/ML IV BOLUS
INTRAVENOUS | Status: DC | PRN
  Administered 2018-01-30: 40 mg via INTRAVENOUS
  Administered 2018-01-30: 120 mg via INTRAVENOUS

## 2018-01-30 MED ORDER — ONDANSETRON HCL 4 MG/2ML IJ SOLN
INTRAMUSCULAR | Status: DC | PRN
  Administered 2018-01-30: 4 mg via INTRAVENOUS

## 2018-01-30 MED ORDER — BISACODYL 10 MG RE SUPP: 10.0000 mg | Freq: Every day | RECTAL | Status: DC | PRN

## 2018-01-30 MED ORDER — ROCURONIUM BROMIDE 50 MG/5ML IV SOSY
PREFILLED_SYRINGE | INTRAVENOUS | Status: DC | PRN
  Administered 2018-01-30: 10 mg via INTRAVENOUS
  Administered 2018-01-30: 20 mg via INTRAVENOUS
  Administered 2018-01-30: 50 mg via INTRAVENOUS
  Administered 2018-01-30: 20 mg via INTRAVENOUS

## 2018-01-30 MED ORDER — HYDROMORPHONE HCL 1 MG/ML IJ SOLN
INTRAMUSCULAR | Status: DC | PRN
  Administered 2018-01-30 (×2): 0.5 mg via INTRAVENOUS

## 2018-01-30 MED ORDER — PROPOFOL 10 MG/ML IV BOLUS
INTRAVENOUS | Status: AC
  Filled 2018-01-30: qty 20

## 2018-01-30 MED ORDER — CYCLOBENZAPRINE HCL 10 MG PO TABS
10.0000 mg | ORAL_TABLET | Freq: Three times a day (TID) | ORAL | Status: DC | PRN
  Administered 2018-01-30 – 2018-01-31 (×2): 10 mg via ORAL
  Filled 2018-01-30 (×3): qty 1

## 2018-01-30 MED ORDER — SODIUM CHLORIDE 0.9% FLUSH: 3.0000 mL | INTRAVENOUS | Status: DC | PRN

## 2018-01-30 MED ORDER — LACTATED RINGERS IV SOLN
INTRAVENOUS | Status: DC
  Administered 2018-01-30 (×2): via INTRAVENOUS

## 2018-01-30 MED ORDER — BACITRACIN ZINC 500 UNIT/GM EX OINT
TOPICAL_OINTMENT | CUTANEOUS | Status: AC
  Filled 2018-01-30: qty 28.35

## 2018-01-30 MED ORDER — BUPIVACAINE LIPOSOME 1.3 % IJ SUSP
20.0000 mL | INTRAMUSCULAR | Status: AC
  Administered 2018-01-30: 20 mL
  Filled 2018-01-30: qty 20

## 2018-01-30 MED ORDER — ARTIFICIAL TEARS OPHTHALMIC OINT
TOPICAL_OINTMENT | OPHTHALMIC | Status: DC | PRN
  Administered 2018-01-30: 1 via OPHTHALMIC

## 2018-01-30 MED ORDER — METOCLOPRAMIDE HCL 5 MG/ML IJ SOLN: 10.0000 mg | Freq: Once | INTRAMUSCULAR | Status: DC | PRN

## 2018-01-30 MED ORDER — OXYCODONE HCL 5 MG PO TABS
10.0000 mg | ORAL_TABLET | ORAL | Status: DC | PRN
  Administered 2018-01-30 – 2018-01-31 (×4): 10 mg via ORAL
  Filled 2018-01-30 (×5): qty 2

## 2018-01-30 MED ORDER — VANCOMYCIN HCL IN DEXTROSE 1-5 GM/200ML-% IV SOLN
1000.0000 mg | INTRAVENOUS | Status: AC
  Administered 2018-01-30: 1000 mg via INTRAVENOUS

## 2018-01-30 MED ORDER — VANCOMYCIN HCL 500 MG IV SOLR
500.0000 mg | Freq: Once | INTRAVENOUS | Status: AC
  Administered 2018-01-30: 500 mg via INTRAVENOUS
  Filled 2018-01-30: qty 500

## 2018-01-30 MED ORDER — HYDROMORPHONE HCL 1 MG/ML IJ SOLN
INTRAMUSCULAR | Status: AC
  Administered 2018-01-30: 0.5 mg via INTRAVENOUS
  Filled 2018-01-30: qty 1

## 2018-01-30 MED ORDER — LIDOCAINE 2% (20 MG/ML) 5 ML SYRINGE
INTRAMUSCULAR | Status: DC | PRN
  Administered 2018-01-30: 50 mg via INTRAVENOUS

## 2018-01-30 MED ORDER — BACITRACIN ZINC 500 UNIT/GM EX OINT
TOPICAL_OINTMENT | CUTANEOUS | Status: DC | PRN
  Administered 2018-01-30: 1 via TOPICAL

## 2018-01-30 MED ORDER — FENTANYL CITRATE (PF) 250 MCG/5ML IJ SOLN
INTRAMUSCULAR | Status: DC | PRN
  Administered 2018-01-30 (×5): 50 ug via INTRAVENOUS

## 2018-01-30 MED ORDER — NIACIN 500 MG PO TABS
500.0000 mg | ORAL_TABLET | Freq: Two times a day (BID) | ORAL | Status: DC
  Filled 2018-01-30 (×2): qty 1

## 2018-01-30 MED ORDER — ESTRADIOL 2 MG PO TABS
2.0000 mg | ORAL_TABLET | Freq: Every day | ORAL | Status: DC
  Administered 2018-01-31: 2 mg via ORAL
  Filled 2018-01-30: qty 1

## 2018-01-30 MED ORDER — GLYCOPYRROLATE PF 0.2 MG/ML IJ SOSY
PREFILLED_SYRINGE | INTRAMUSCULAR | Status: DC | PRN
  Administered 2018-01-30: .1 mg via INTRAVENOUS

## 2018-01-30 MED ORDER — HYDROMORPHONE HCL 1 MG/ML IJ SOLN
0.2500 mg | INTRAMUSCULAR | Status: DC | PRN
  Administered 2018-01-30 (×4): 0.5 mg via INTRAVENOUS

## 2018-01-30 MED ORDER — SODIUM CHLORIDE 0.9 % IV SOLN
INTRAVENOUS | Status: DC | PRN
  Administered 2018-01-30: 14:00:00

## 2018-01-30 MED ORDER — PHENOL 1.4 % MT LIQD: 1.0000 | OROMUCOSAL | Status: DC | PRN

## 2018-01-30 MED ORDER — ONDANSETRON HCL 4 MG/2ML IJ SOLN
4.0000 mg | Freq: Four times a day (QID) | INTRAMUSCULAR | Status: DC | PRN
  Administered 2018-01-30: 4 mg via INTRAVENOUS
  Filled 2018-01-30: qty 2

## 2018-01-30 MED ORDER — MORPHINE SULFATE (PF) 4 MG/ML IV SOLN: 4.0000 mg | INTRAVENOUS | Status: DC | PRN

## 2018-01-30 MED ORDER — ZOLPIDEM TARTRATE 5 MG PO TABS: 5.0000 mg | ORAL_TABLET | Freq: Every evening | ORAL | Status: DC | PRN

## 2018-01-30 MED ORDER — SUGAMMADEX SODIUM 200 MG/2ML IV SOLN
INTRAVENOUS | Status: DC | PRN
  Administered 2018-01-30: 200 mg via INTRAVENOUS

## 2018-01-30 MED ORDER — CHLORHEXIDINE GLUCONATE CLOTH 2 % EX PADS: 6.0000 | MEDICATED_PAD | Freq: Once | CUTANEOUS | Status: DC

## 2018-01-30 MED ORDER — MENTHOL 3 MG MT LOZG: 1.0000 | LOZENGE | OROMUCOSAL | Status: DC | PRN

## 2018-01-30 MED ORDER — SODIUM CHLORIDE 0.9 % IV SOLN: 250.0000 mL | INTRAVENOUS | Status: DC

## 2018-01-30 MED ORDER — MEPERIDINE HCL 50 MG/ML IJ SOLN: 6.2500 mg | INTRAMUSCULAR | Status: DC | PRN

## 2018-01-30 MED ORDER — THYROID 60 MG PO TABS
60.0000 mg | ORAL_TABLET | Freq: Every day | ORAL | Status: DC
  Administered 2018-01-31: 60 mg via ORAL
  Filled 2018-01-30: qty 1

## 2018-01-30 MED ORDER — VANCOMYCIN HCL 1000 MG IV SOLR
INTRAVENOUS | Status: AC
  Filled 2018-01-30: qty 1000

## 2018-01-30 MED ORDER — THROMBIN 5000 UNITS EX SOLR
OROMUCOSAL | Status: DC | PRN
  Administered 2018-01-30 (×2): via TOPICAL

## 2018-01-30 MED ORDER — VANCOMYCIN HCL 1 G IV SOLR
INTRAVENOUS | Status: DC | PRN
  Administered 2018-01-30: 1000 mg via TOPICAL

## 2018-01-30 MED ORDER — ACETAMINOPHEN 500 MG PO TABS
1000.0000 mg | ORAL_TABLET | Freq: Four times a day (QID) | ORAL | Status: AC
  Administered 2018-01-30 – 2018-01-31 (×4): 1000 mg via ORAL
  Filled 2018-01-30 (×4): qty 2

## 2018-01-30 MED ORDER — ROCURONIUM BROMIDE 50 MG/5ML IV SOSY
PREFILLED_SYRINGE | INTRAVENOUS | Status: AC
  Filled 2018-01-30: qty 5

## 2018-01-30 MED ORDER — OXYCODONE HCL 5 MG PO TABS
ORAL_TABLET | ORAL | Status: AC
  Filled 2018-01-30: qty 2

## 2018-01-30 MED ORDER — SODIUM CHLORIDE 0.9% FLUSH: 3.0000 mL | Freq: Two times a day (BID) | INTRAVENOUS | Status: DC

## 2018-01-30 MED ORDER — MIDAZOLAM HCL 2 MG/2ML IJ SOLN
INTRAMUSCULAR | Status: AC
  Filled 2018-01-30: qty 2

## 2018-01-30 MED ORDER — 0.9 % SODIUM CHLORIDE (POUR BTL) OPTIME
TOPICAL | Status: DC | PRN
  Administered 2018-01-30: 1000 mL

## 2018-01-30 SURGICAL SUPPLY — 74 items
BAG DECANTER FOR FLEXI CONT (MISCELLANEOUS) ×3 IMPLANT
BASKET BONE COLLECTION (BASKET) ×3 IMPLANT
BENZOIN TINCTURE PRP APPL 2/3 (GAUZE/BANDAGES/DRESSINGS) ×3 IMPLANT
BLADE CLIPPER SURG (BLADE) IMPLANT
BUR MATCHSTICK NEURO 3.0 LAGG (BURR) ×3 IMPLANT
BUR PRECISION FLUTE 6.0 (BURR) ×3 IMPLANT
CAGE ALTERA 10X31X9-13 15D (Cage) ×2 IMPLANT
CAGE ALTERA 9-13-15-31MM (Cage) ×1 IMPLANT
CANISTER SUCT 3000ML PPV (MISCELLANEOUS) ×3 IMPLANT
CAP REVERE LOCKING (Cap) ×12 IMPLANT
CARTRIDGE OIL MAESTRO DRILL (MISCELLANEOUS) ×1 IMPLANT
CLOSURE WOUND 1/2 X4 (GAUZE/BANDAGES/DRESSINGS) ×1
CONT SPEC 4OZ CLIKSEAL STRL BL (MISCELLANEOUS) ×3 IMPLANT
COVER BACK TABLE 60X90IN (DRAPES) ×3 IMPLANT
COVER WAND RF STERILE (DRAPES) ×3 IMPLANT
DECANTER SPIKE VIAL GLASS SM (MISCELLANEOUS) ×3 IMPLANT
DIFFUSER DRILL AIR PNEUMATIC (MISCELLANEOUS) ×3 IMPLANT
DRAPE C-ARM 42X72 X-RAY (DRAPES) ×6 IMPLANT
DRAPE HALF SHEET 40X57 (DRAPES) ×6 IMPLANT
DRAPE LAPAROTOMY 100X72X124 (DRAPES) ×3 IMPLANT
DRAPE SURG 17X23 STRL (DRAPES) ×12 IMPLANT
DRSG OPSITE POSTOP 4X6 (GAUZE/BANDAGES/DRESSINGS) ×3 IMPLANT
ELECT BLADE 4.0 EZ CLEAN MEGAD (MISCELLANEOUS) ×3
ELECT REM PT RETURN 9FT ADLT (ELECTROSURGICAL) ×3
ELECTRODE BLDE 4.0 EZ CLN MEGD (MISCELLANEOUS) ×1 IMPLANT
ELECTRODE REM PT RTRN 9FT ADLT (ELECTROSURGICAL) ×1 IMPLANT
EVACUATOR 1/8 PVC DRAIN (DRAIN) IMPLANT
GAUZE 4X4 16PLY RFD (DISPOSABLE) IMPLANT
GAUZE SPONGE 4X4 12PLY STRL (GAUZE/BANDAGES/DRESSINGS) ×3 IMPLANT
GLOVE BIO SURGEON STRL SZ7 (GLOVE) ×12 IMPLANT
GLOVE BIO SURGEON STRL SZ8 (GLOVE) ×6 IMPLANT
GLOVE BIO SURGEON STRL SZ8.5 (GLOVE) ×6 IMPLANT
GLOVE BIOGEL M 6.5 STRL (GLOVE) ×6 IMPLANT
GLOVE BIOGEL PI IND STRL 6.5 (GLOVE) ×2 IMPLANT
GLOVE BIOGEL PI IND STRL 7.0 (GLOVE) ×1 IMPLANT
GLOVE BIOGEL PI IND STRL 7.5 (GLOVE) ×2 IMPLANT
GLOVE BIOGEL PI IND STRL 8.5 (GLOVE) ×1 IMPLANT
GLOVE BIOGEL PI INDICATOR 6.5 (GLOVE) ×4
GLOVE BIOGEL PI INDICATOR 7.0 (GLOVE) ×2
GLOVE BIOGEL PI INDICATOR 7.5 (GLOVE) ×4
GLOVE BIOGEL PI INDICATOR 8.5 (GLOVE) ×2
GLOVE ECLIPSE 8.5 STRL (GLOVE) ×3 IMPLANT
GLOVE EXAM NITRILE XL STR (GLOVE) IMPLANT
GOWN STRL REUS W/ TWL LRG LVL3 (GOWN DISPOSABLE) IMPLANT
GOWN STRL REUS W/ TWL XL LVL3 (GOWN DISPOSABLE) ×2 IMPLANT
GOWN STRL REUS W/TWL 2XL LVL3 (GOWN DISPOSABLE) IMPLANT
GOWN STRL REUS W/TWL LRG LVL3 (GOWN DISPOSABLE)
GOWN STRL REUS W/TWL XL LVL3 (GOWN DISPOSABLE) ×4
HEMOSTAT POWDER KIT SURGIFOAM (HEMOSTASIS) ×6 IMPLANT
KIT BASIN OR (CUSTOM PROCEDURE TRAY) ×3 IMPLANT
KIT TURNOVER KIT B (KITS) ×3 IMPLANT
MILL MEDIUM DISP (BLADE) IMPLANT
NEEDLE HYPO 21X1.5 SAFETY (NEEDLE) ×3 IMPLANT
NEEDLE HYPO 22GX1.5 SAFETY (NEEDLE) ×3 IMPLANT
NS IRRIG 1000ML POUR BTL (IV SOLUTION) ×3 IMPLANT
OIL CARTRIDGE MAESTRO DRILL (MISCELLANEOUS) ×3
PACK LAMINECTOMY NEURO (CUSTOM PROCEDURE TRAY) ×3 IMPLANT
PAD ARMBOARD 7.5X6 YLW CONV (MISCELLANEOUS) ×9 IMPLANT
PATTIES SURGICAL .5 X1 (DISPOSABLE) IMPLANT
ROD REVERE CURVED 6.35X35MM (Rod) ×6 IMPLANT
SCREW 7.5X50MM (Screw) ×12 IMPLANT
SPONGE LAP 4X18 RFD (DISPOSABLE) IMPLANT
SPONGE NEURO XRAY DETECT 1X3 (DISPOSABLE) IMPLANT
SPONGE SURGIFOAM ABS GEL 100 (HEMOSTASIS) IMPLANT
STRIP BIOACTIVE 10CC 25X50X8 (Miscellaneous) ×3 IMPLANT
STRIP CLOSURE SKIN 1/2X4 (GAUZE/BANDAGES/DRESSINGS) ×2 IMPLANT
SUT VIC AB 1 CT1 18XBRD ANBCTR (SUTURE) ×2 IMPLANT
SUT VIC AB 1 CT1 8-18 (SUTURE) ×4
SUT VIC AB 2-0 CP2 18 (SUTURE) ×6 IMPLANT
SYR 20CC LL (SYRINGE) ×3 IMPLANT
TOWEL GREEN STERILE (TOWEL DISPOSABLE) ×3 IMPLANT
TOWEL GREEN STERILE FF (TOWEL DISPOSABLE) ×3 IMPLANT
TRAY FOLEY MTR SLVR 16FR STAT (SET/KITS/TRAYS/PACK) ×3 IMPLANT
WATER STERILE IRR 1000ML POUR (IV SOLUTION) ×3 IMPLANT

## 2018-01-30 NOTE — Transfer of Care (Signed)
Immediate Anesthesia Transfer of Care Note  Patient: Molly Ferrell  Procedure(s) Performed: POSTERIOR LUMBAR INTERBODY FUSION, INTERBODY PROSTHESIS,POSTERIOR INSTRUMENTATION LUMBAR FOUR- LUMBAR FIVE (N/A )  Patient Location: PACU  Anesthesia Type:General  Level of Consciousness: awake, alert , oriented and patient cooperative  Airway & Oxygen Therapy: Patient Spontanous Breathing and Patient connected to nasal cannula oxygen  Post-op Assessment: Report given to RN, Post -op Vital signs reviewed and stable and Patient moving all extremities  Post vital signs: Reviewed and stable  Last Vitals:  Vitals Value Taken Time  BP 106/81 01/30/2018  4:31 PM  Temp 36.5 C 01/30/2018  4:30 PM  Pulse 86 01/30/2018  4:34 PM  Resp 17 01/30/2018  4:34 PM  SpO2 97 % 01/30/2018  4:34 PM  Vitals shown include unvalidated device data.  Last Pain:  Vitals:   01/30/18 1630  TempSrc:   PainSc: 0-No pain         Complications: No apparent anesthesia complications

## 2018-01-30 NOTE — Progress Notes (Signed)
Subjective: The patient is alert and pleasant.  She looks well.  She is in no apparent distress.  Objective: Vital signs in last 24 hours: Temp:  [97.6 F (36.4 C)-97.7 F (36.5 C)] 97.7 F (36.5 C) (12/04 1630) Pulse Rate:  [61-81] 81 (12/04 1631) Resp:  [13-18] 13 (12/04 1631) BP: (106-126)/(66-81) 106/81 (12/04 1631) SpO2:  [98 %-100 %] 100 % (12/04 1631) Estimated body mass index is 26.03 kg/m as calculated from the following:   Height as of 01/21/18: 5' (1.524 m).   Weight as of 01/21/18: 60.5 kg.   Intake/Output from previous day: No intake/output data recorded. Intake/Output this shift: Total I/O In: 1400 [I.V.:1400] Out: 470 [Urine:320; Blood:150]  Physical exam the patient is alert.  She is moving her lower extremities well.  Lab Results: No results for input(s): WBC, HGB, HCT, PLT in the last 72 hours. BMET No results for input(s): NA, K, CL, CO2, GLUCOSE, BUN, CREATININE, CALCIUM in the last 72 hours.  Studies/Results: No results found.  Assessment/Plan: The patient is doing well.  LOS: 0 days     Ophelia Charter 01/30/2018, 4:36 PM

## 2018-01-30 NOTE — Anesthesia Procedure Notes (Signed)
Procedure Name: Intubation Date/Time: 01/30/2018 1:12 PM Performed by: Wilburn Cornelia, CRNA Pre-anesthesia Checklist: Patient identified, Emergency Drugs available, Suction available, Patient being monitored and Timeout performed Patient Re-evaluated:Patient Re-evaluated prior to induction Oxygen Delivery Method: Circle system utilized Preoxygenation: Pre-oxygenation with 100% oxygen Induction Type: IV induction Ventilation: Mask ventilation without difficulty Laryngoscope Size: Mac and 3 Grade View: Grade I Tube type: Oral Tube size: 7.0 mm Number of attempts: 1 Airway Equipment and Method: Stylet Placement Confirmation: ETT inserted through vocal cords under direct vision,  positive ETCO2,  CO2 detector and breath sounds checked- equal and bilateral Secured at: 21 cm Tube secured with: Tape Dental Injury: Teeth and Oropharynx as per pre-operative assessment

## 2018-01-30 NOTE — Op Note (Signed)
Brief history: The patient is a 67 year old white female who has complained of back, buttock and leg pain consistent with neurogenic claudication.  She has failed medical management and was worked up with lumbar x-rays and a lumbar MRI.  This demonstrated an L4-5 spondylolisthesis, facet arthropathy and spinal stenosis.  I discussed the various treatment options with the patient.  She has decided to proceed with surgery after weighing the risks, benefits and alternatives.  Preoperative diagnosis: L4-5 spondylolisthesis, degenerative disc disease, spinal stenosis compressing both the L4 and the L5 nerve roots; lumbago; lumbar radiculopathy; neurogenic claudication  Postoperative diagnosis: The same  Procedure: Bilateral L4-5 laminotomy/foraminotomies/medial facetectomy to decompress the bilateral L4 and L5 nerve roots(the work required to do this was in addition to the work required to do the posterior lumbar interbody fusion because of the patient's spinal stenosis, facet arthropathy. Etc. requiring a wide decompression of the nerve roots.);  L4-5 transforaminal lumbar interbody fusion with local morselized autograft bone and Kinnex graft extender; insertion of interbody prosthesis at L4-5 (globus peek expandable interbody prosthesis); posterior nonsegmental instrumentation from L4 to L5 with globus titanium pedicle screws and rods; posterior lateral arthrodesis at L4-5 bilaterally with local morselized autograft bone and Kinnex bone graft extender.  Surgeon: Dr. Earle Gell  Asst.: Arnetha Massy, RN nurse practitioner  Anesthesia: Gen. endotracheal  Estimated blood loss: 200 cc  Drains: None  Complications: None  Description of procedure: The patient was brought to the operating room by the anesthesia team. General endotracheal anesthesia was induced. The patient was turned to the prone position on the Wilson frame. The patient's lumbosacral region was then prepared with Betadine scrub and  Betadine solution. Sterile drapes were applied.  I then injected the area to be incised with Marcaine with epinephrine solution. I then used the scalpel to make a linear midline incision over the L4-5 interspace. I then used electrocautery to perform a bilateral subperiosteal dissection exposing the spinous process and lamina of L4 and L5. We then obtained intraoperative radiograph to confirm our location. We then inserted the Verstrac retractor to provide exposure.  I began the decompression by using the high speed drill to perform laminotomies at L4-5 bilaterally. We then used the Kerrison punches to widen the laminotomy and removed the ligamentum flavum at L4-5 bilaterally. We used the Kerrison punches to remove the medial facets at L4-5 bilaterally. We performed wide foraminotomies about the bilateral L4 and L5 nerve roots completing the decompression.  We now turned our attention to the posterior lumbar interbody fusion. I used a scalpel to incise the intervertebral disc at L4-5 bilaterally. I then performed a partial intervertebral discectomy at L4-5 bilaterally using the pituitary forceps. We prepared the vertebral endplates at O3-7 bilaterally for the fusion by removing the soft tissues with the curettes. We then used the trial spacers to pick the appropriate sized interbody prosthesis. We prefilled his prosthesis with a combination of local morselized autograft bone that we obtained during the decompression as well as Kinnex bone graft extender. We inserted the prefilled prosthesis into the interspace at L4-5, we then turned and expanded the prosthesis. There was a good snug fit of the prosthesis in the interspace. We then filled and the remainder of the intervertebral disc space with local morselized autograft bone and Kinnex. This completed the posterior lumbar interbody arthrodesis.  We now turned attention to the instrumentation. Under fluoroscopic guidance we cannulated the bilateral L4 and L5  pedicles with the bone probe. We then removed the bone probe. We  then tapped the pedicle with a 6.5 millimeter tap. We then removed the tap. We probed inside the tapped pedicle with a ball probe to rule out cortical breaches. We then inserted a 7.5 x 50 millimeter pedicle screw into the L4 and L5 pedicles bilaterally under fluoroscopic guidance. We then palpated along the medial aspect of the pedicles to rule out cortical breaches. There were none. The nerve roots were not injured. We then connected the unilateral pedicle screws with a lordotic rod. We compressed the construct and secured the rod in place with the caps. We then tightened the caps appropriately. This completed the instrumentation from L4-5 bilaterally.  We now turned our attention to the posterior lateral arthrodesis at L4-5 bilaterally. We used the high-speed drill to decorticate the remainder of the facets, pars, transverse process at L4-5 bilaterally. We then applied a combination of local morselized autograft bone and Kinnex bone graft extender over these decorticated posterior lateral structures. This completed the posterior lateral arthrodesis.  We then obtained hemostasis using bipolar electrocautery. We irrigated the wound out with bacitracin solution. We inspected the thecal sac and nerve roots and noted they were well decompressed. We then removed the retractor. We placed vancomycin powder in the wound.  We injected Exparel . We reapproximated patient's thoracolumbar fascia with interrupted #1 Vicryl suture. We reapproximated patient's subcutaneous tissue with interrupted 2-0 Vicryl suture. The reapproximated patient's skin with Steri-Strips and benzoin. The wound was then coated with bacitracin ointment. A sterile dressing was applied. The drapes were removed. The patient was subsequently returned to the supine position where they were extubated by the anesthesia team. He was then transported to the post anesthesia care unit in stable  condition. All sponge instrument and needle counts were reportedly correct at the end of this case.

## 2018-01-30 NOTE — Anesthesia Postprocedure Evaluation (Signed)
Anesthesia Post Note  Patient: OZA OBERLE  Procedure(s) Performed: POSTERIOR LUMBAR INTERBODY FUSION, INTERBODY PROSTHESIS,POSTERIOR INSTRUMENTATION LUMBAR FOUR- LUMBAR FIVE (N/A )     Patient location during evaluation: PACU Anesthesia Type: General Level of consciousness: awake and alert and oriented Pain management: pain level controlled Vital Signs Assessment: post-procedure vital signs reviewed and stable Respiratory status: spontaneous breathing, nonlabored ventilation and respiratory function stable Cardiovascular status: blood pressure returned to baseline and stable Postop Assessment: no apparent nausea or vomiting Anesthetic complications: no    Last Vitals:  Vitals:   01/30/18 1701 01/30/18 1722  BP: 110/73 107/76  Pulse: 89 78  Resp: 16 16  Temp:  36.6 C  SpO2: 93% 97%    Last Pain:  Vitals:   01/30/18 1722  TempSrc: Oral  PainSc: 0-No pain                 Niaja Stickley A.

## 2018-01-30 NOTE — Progress Notes (Signed)
Pharmacy Antibiotic Note  Molly Ferrell is a 67 y.o. female admitted on 01/30/2018  for spinal surgery.  Pharmacy has been consulted for vancomycin dosing for post-op surgical prophylaxis.  Patient does not have a drain per op note.  SCr 0.96, CrCL 45 ml/min.  Pre-op vancomycin dose was given around 1115.  Plan: Vanc 500mg  IV x 1 ar 2315 Pharmacy will sign off.  Thank you for the consult!     Temp (24hrs), Avg:97.7 F (36.5 C), Min:97.6 F (36.4 C), Max:97.8 F (36.6 C)  No results for input(s): WBC, CREATININE, LATICACIDVEN, VANCOTROUGH, VANCOPEAK, VANCORANDOM, GENTTROUGH, GENTPEAK, GENTRANDOM, TOBRATROUGH, TOBRAPEAK, TOBRARND, AMIKACINPEAK, AMIKACINTROU, AMIKACIN in the last 168 hours.  Estimated Creatinine Clearance: 45 mL/min (by C-G formula based on SCr of 0.96 mg/dL).    Allergies  Allergen Reactions  . Penicillins Hives and Itching    CHILDHOOD reaction to Pen G Has patient had a PCN reaction causing immediate rash, facial/tongue/throat swelling, SOB or lightheadedness with hypotension: No Has patient had a PCN reaction causing severe rash involving mucus membranes or skin necrosis: No Has patient had a PCN reaction that required hospitalization: No Has patient had a PCN reaction occurring within the last 10 years: No If all of the above answers are "NO", then may proceed with Cephalosporin use.        Charleen Madera D. Mina Marble, PharmD, BCPS, [address] 01/30/2018, 5:54 PM

## 2018-01-30 NOTE — H&P (Signed)
Subjective: The patient is a 67 year old white female who has complained of back and leg pain consistent with neurogenic claudication/lumbar radiculopathy.  She has failed medical management and was worked up with a lumbar MRI and x-rays which demonstrated an L4-5 spondylolisthesis and spinal stenosis.  I discussed the various treatment options with her.  She has decided to proceed with surgery.  Past Medical History:  Diagnosis Date  . Arthritis   . Complication of anesthesia    "many years ago, had problems waking up"; patient stated no problems with recent surgeries 01/21/2018  . GERD (gastroesophageal reflux disease)   . Hard of hearing   . History of bronchitis   . Hypercholesteremia   . Hypertriglyceridemia   . Hypothyroidism   . Macular degeneration of both eyes   . OA (osteoarthritis)    severe right knee  . Spondylolisthesis of lumbar region   . Wears glasses     Past Surgical History:  Procedure Laterality Date  . ABDOMINAL HYSTERECTOMY    . APPENDECTOMY    . CATARACT EXTRACTION W/ INTRAOCULAR LENS IMPLANT Bilateral   . COLONOSCOPY    . ESOPHAGOGASTRODUODENOSCOPY    . KNEE ARTHROPLASTY Left   . TONSILLECTOMY    . TOTAL KNEE ARTHROPLASTY Right 06/12/2016   Procedure: TOTAL KNEE ARTHROPLASTY;  Surgeon: Vickey Huger, MD;  Location: [facility];  Service: Orthopedics;  Laterality: Right;  . TOTAL KNEE REVISION Left 06/14/2015   Procedure: TOTAL KNEE REVISION POLY EXCHANGE;  Surgeon: Vickey Huger, MD;  Location: [facility];  Service: Orthopedics;  Laterality: Left;    Allergies  Allergen Reactions  . Penicillins Hives and Itching    CHILDHOOD reaction to Pen G Has patient had a PCN reaction causing immediate rash, facial/tongue/throat swelling, SOB or lightheadedness with hypotension: No Has patient had a PCN reaction causing severe rash involving mucus membranes or skin necrosis: No Has patient had a PCN reaction that required hospitalization: No Has patient had a PCN reaction  occurring within the last 10 years: No If all of the above answers are "NO", then may proceed with Cephalosporin use.       Social History   Tobacco Use  . Smoking status: Former Research scientist (life sciences)  . Smokeless tobacco: Never Used  . Tobacco comment: "quit 10-15 years ago" 06/03/15  Substance Use Topics  . Alcohol use: Yes    Alcohol/week: 1.0 standard drinks    Types: 1 Glasses of wine per week    Comment: weekends    Family History  Problem Relation Age of Onset  . Heart disease Mother   . Heart disease Father    Prior to Admission medications   Medication Sig Start Date End Date Taking? Authorizing Provider  Ascorbic Acid (VITAMIN C) 1000 MG tablet Take 1,000 mg by mouth daily.   Yes [provider]  aspirin EC 81 MG tablet Take 81 mg by mouth 2 (two) times daily.   Yes [provider]  Calcium Polycarbophil (EQUALACTIN) 625 MG CHEW Chew 2 each by mouth 2 (two) times daily.   Yes [provider]  Carboxymeth-Glycerin-Polysorb (REFRESH OPTIVE ADVANCED) 0.5-1-0.5 % SOLN Place 1 drop into both eyes 2 (two) times daily as needed (itchy dry eyes).   Yes [provider]  Cholecalciferol (VITAMIN D) 2000 units CAPS Take 2,000 Units by mouth daily.   Yes [provider]  estradiol (ESTRACE) 2 MG tablet Take 2 mg by mouth daily.   Yes [provider]  LUTEIN PO Take 1 tablet by mouth  2 (two) times daily.   Yes [provider]  niacin 500 MG tablet Take 500 mg by mouth 2 (two) times daily.    Yes [provider]  Omega-3 Fatty Acids (FISH OIL PO) Take 1 capsule by mouth daily.   Yes [provider]  omeprazole (PRILOSEC) 40 MG capsule Take 40 mg by mouth daily.   Yes [provider]  progesterone (PROMETRIUM) 200 MG capsule Take 400 mg by mouth at bedtime. Compounded at [facility] in [place]. 05/18/16  Yes [provider]  thyroid (ARMOUR) 60 MG tablet Take 60 mg by mouth daily before breakfast.   Yes  [provider]  vitamin E 400 UNIT capsule Take 400 Units by mouth daily.   Yes [provider]     Review of Systems  Positive ROS: As above  All other systems have been reviewed and were otherwise negative with the exception of those mentioned in the HPI and as above.  Objective: Vital signs in last 24 hours: Temp:  [97.6 F (36.4 C)] 97.6 F (36.4 C) (12/04 1041) Pulse Rate:  [61] 61 (12/04 1041) Resp:  [18] 18 (12/04 1041) BP: (126)/(66) 126/66 (12/04 1041) SpO2:  [98 %] 98 % (12/04 1041) Estimated body mass index is 26.03 kg/m as calculated from the following:   Height as of 01/21/18: 5' (1.524 m).   Weight as of 01/21/18: 60.5 kg.   General Appearance: Alert Head: Normocephalic, without obvious abnormality, atraumatic Eyes: PERRL, conjunctiva/corneas clear, EOM's intact,    Ears: Normal  Throat: Normal  Neck: Supple, Back: unremarkable Lungs: Clear to auscultation bilaterally, respirations unlabored Heart: Regular rate and rhythm, no murmur, rub or gallop Abdomen: Soft, non-tender Extremities: Extremities normal, atraumatic, no cyanosis or edema Skin: unremarkable  NEUROLOGIC:   Mental status: alert and oriented,Motor Exam - grossly normal Sensory Exam - grossly normal Reflexes:  Coordination - grossly normal Gait - grossly normal Balance - grossly normal Cranial Nerves: I: smell Not tested  II: visual acuity  OS: Normal  OD: Normal   II: visual fields Full to confrontation  II: pupils Equal, round, reactive to light  III,VII: ptosis None  III,IV,VI: extraocular muscles  Full ROM  V: mastication Normal  V: facial light touch sensation  Normal  V,VII: corneal reflex  Present  VII: facial muscle function - upper  Normal  VII: facial muscle function - lower Normal  VIII: hearing Not tested  IX: soft palate elevation  Normal  IX,X: gag reflex Present  XI: trapezius strength  5/5  XI: sternocleidomastoid strength 5/5  XI: neck flexion  strength  5/5  XII: tongue strength  Normal    Data Review Lab Results  Component Value Date   WBC 8.6 01/21/2018   HGB 16.5 (H) 01/21/2018   HCT 51.0 (H) 01/21/2018   MCV 91.7 01/21/2018   PLT 222 01/21/2018   Lab Results  Component Value Date   NA 140 01/21/2018   K 4.1 01/21/2018   CL 109 01/21/2018   CO2 23 01/21/2018   BUN 16 01/21/2018   CREATININE 0.96 01/21/2018   GLUCOSE 94 01/21/2018   Lab Results  Component Value Date   INR 1.03 06/03/2015    Assessment/Plan: L4-5 spondylolisthesis, facet arthropathy, neurogenic claudication, lumbago, lumbar radiculopathy; I have discussed the situation with the patient and her husband.  I reviewed her imaging studies with her and pointed out the abnormalities.  We have discussed the various treatment options including surgery.  I have described the  surgical treatment option of an L4-5 decompression, instrumentation and fusion.  I have shown her surgical models.  I have given her surgical pamphlet.  We have discussed the risks, benefits, alternatives, expected postoperative course, and likelihood of achieving our goals with surgery.  I have answered all her questions.  She has decided proceed with surgery.   Ophelia Charter 01/30/2018 12:28 PM

## 2018-01-31 LAB — CBC
HCT: 41.3 % (ref 36.0–46.0)
Hemoglobin: 13.7 g/dL (ref 12.0–15.0)
MCH: 30 pg (ref 26.0–34.0)
MCHC: 33.2 g/dL (ref 30.0–36.0)
MCV: 90.6 fL (ref 80.0–100.0)
NRBC: 0 % (ref 0.0–0.2)
Platelets: 205 10*3/uL (ref 150–400)
RBC: 4.56 MIL/uL (ref 3.87–5.11)
RDW: 12.2 % (ref 11.5–15.5)
WBC: 18.1 10*3/uL — ABNORMAL HIGH (ref 4.0–10.5)

## 2018-01-31 LAB — BASIC METABOLIC PANEL
ANION GAP: 12 (ref 5–15)
BUN: 12 mg/dL (ref 8–23)
CO2: 21 mmol/L — ABNORMAL LOW (ref 22–32)
Calcium: 8.7 mg/dL — ABNORMAL LOW (ref 8.9–10.3)
Chloride: 103 mmol/L (ref 98–111)
Creatinine, Ser: 0.99 mg/dL (ref 0.44–1.00)
GFR calc Af Amer: >60 mL/min (ref >60)
GFR, EST NON AFRICAN AMERICAN: 58 mL/min — AB (ref >60)
Glucose, Bld: 125 mg/dL — ABNORMAL HIGH (ref 70–99)
Potassium: 5 mmol/L (ref 3.5–5.1)
Sodium: 136 mmol/L (ref 135–145)

## 2018-01-31 MED ORDER — CYCLOBENZAPRINE HCL 10 MG PO TABS: 10.0000 mg | ORAL_TABLET | Freq: Three times a day (TID) | ORAL | 0 refills | Status: DC | PRN

## 2018-01-31 MED ORDER — OXYCODONE HCL 5 MG PO TABS: 5.0000 mg | ORAL_TABLET | ORAL | 0 refills | Status: DC | PRN

## 2018-01-31 MED ORDER — DOCUSATE SODIUM 100 MG PO CAPS: 100.0000 mg | ORAL_CAPSULE | Freq: Two times a day (BID) | ORAL | 0 refills | Status: DC

## 2018-01-31 NOTE — Progress Notes (Signed)
Discharge instructions, RX's and follow up appts explained and provided to patient and c/g verbalized understanding. Patient left floor via wheelchair accompanied by staff.  Cena Bruhn, Tivis Ringer, RN

## 2018-01-31 NOTE — Discharge Instructions (Signed)
Wound Care °Leave incision open to air. °You may shower. °Do not scrub directly on incision.  °Do not put any creams, lotions, or ointments on incision. °Activity °Walk each and every day, increasing distance each day. °No lifting greater than 5 lbs.  Avoid bending, arching, and twisting. °No driving for 2 weeks; may ride as a passenger locally. °If provided with back brace, wear when out of bed.  It is not necessary to wear in bed. °Diet °Resume your normal diet.  °Return to Work °Will be discussed at you follow up appointment. °Call Your Doctor If Any of These Occur °Redness, drainage, or swelling at the wound.  °Temperature greater than 101 degrees. °Severe pain not relieved by pain medication. °Incision starts to come apart. °Follow Up Appt °Call today for appointment in 1-2 weeks ([phone]) or for problems.  If you have any hardware placed in your spine, you will need an x-ray before your appointment. °

## 2018-01-31 NOTE — Discharge Summary (Signed)
Physician Discharge Summary  Patient ID: Molly Ferrell MRN: [id number] DOB/AGE: 1951/02/02 67 y.o.  Admit date: 01/30/2018 Discharge date: 01/31/2018  Admission Diagnoses: Lumbar spondylolisthesis, lumbar spinal stenosis, lumbar facet arthropathy, lumbago, lumbar radiculopathy, neurogenic claudication  Discharge Diagnoses: The same Active Problems:   Spondylolisthesis of lumbar region   Discharged Condition: good  Hospital Course: I performed an L4-5 decompression, instrumentation and fusion on the patient on 01/30/2018.  The surgery went well.  The patient's postoperative course was unremarkable.  On postoperative day #1 she requested discharge home.  The patient, and her husband, were given written and oral discharge instructions.  All their questions were answered.  Consults: Physical therapy Significant Diagnostic Studies: None Treatments: L4-5 decompression, instrumentation, and fusion. Discharge Exam: Blood pressure (!) 118/59, pulse 70, temperature 98 F (36.7 C), temperature source Oral, resp. rate 16, SpO2 98 %. Patient is alert and pleasant.  She looks well.  Her strength is normal.  Disposition: Home  Discharge Instructions    Call MD for:  difficulty breathing, headache or visual disturbances   Complete by:  As directed    Call MD for:  extreme fatigue   Complete by:  As directed    Call MD for:  hives   Complete by:  As directed    Call MD for:  persistant dizziness or light-headedness   Complete by:  As directed    Call MD for:  persistant nausea and vomiting   Complete by:  As directed    Call MD for:  redness, tenderness, or signs of infection (pain, swelling, redness, odor or green/yellow discharge around incision site)   Complete by:  As directed    Call MD for:  severe uncontrolled pain   Complete by:  As directed    Call MD for:  temperature >100.4   Complete by:  As directed    Diet - low sodium heart healthy   Complete by:  As directed    Discharge instructions   Complete by:  As directed    Call [phone] for a followup appointment. Take a stool softener while you are using pain medications.   Driving Restrictions   Complete by:  As directed    Do not drive for 2 weeks.   Increase activity slowly   Complete by:  As directed    Lifting restrictions   Complete by:  As directed    Do not lift more than 5 pounds. No excessive bending or twisting.   May shower / Bathe   Complete by:  As directed    Remove the dressing for 3 days after surgery.  You may shower, but leave the incision alone.   Remove dressing in 48 hours   Complete by:  As directed    Your stitches are under the scan and will dissolve by themselves. The Steri-Strips will fall off after you take a few showers. Do not rub back or pick at the wound, Leave the wound alone.     Allergies as of 01/31/2018      Reactions   Penicillins Hives, Itching   CHILDHOOD reaction to Pen G Has patient had a PCN reaction causing immediate rash, facial/tongue/throat swelling, SOB or lightheadedness with hypotension: No Has patient had a PCN reaction causing severe rash involving mucus membranes or skin necrosis: No Has patient had a PCN reaction that required hospitalization: No Has patient had a PCN reaction occurring within the last 10 years: No If all of the above answers are "NO", then  may proceed with Cephalosporin use.      Medication List    TAKE these medications   aspirin EC 81 MG tablet Take 81 mg by mouth 2 (two) times daily.   cyclobenzaprine 10 MG tablet Commonly known as:  FLEXERIL Take 1 tablet (10 mg total) by mouth 3 (three) times daily as needed for muscle spasms.   docusate sodium 100 MG capsule Commonly known as:  COLACE Take 1 capsule (100 mg total) by mouth 2 (two) times daily.   EQUALACTIN 625 MG Chew Generic drug:  Calcium Polycarbophil Chew 2 each by mouth 2 (two) times daily.   estradiol 2 MG tablet Commonly known as:  ESTRACE Take  2 mg by mouth daily.   FISH OIL PO Take 1 capsule by mouth daily.   LUTEIN PO Take 1 tablet by mouth 2 (two) times daily.   niacin 500 MG tablet Take 500 mg by mouth 2 (two) times daily.   omeprazole 40 MG capsule Commonly known as:  PRILOSEC Take 40 mg by mouth daily.   oxyCODONE 5 MG immediate release tablet Commonly known as:  Oxy IR/ROXICODONE Take 1 tablet (5 mg total) by mouth every 4 (four) hours as needed for moderate pain ((score 4 to 6)).   progesterone 200 MG capsule Commonly known as:  PROMETRIUM Take 400 mg by mouth at bedtime. Compounded at [facility] in [place].   REFRESH OPTIVE ADVANCED 0.5-1-0.5 % Soln Generic drug:  Carboxymeth-Glycerin-Polysorb Place 1 drop into both eyes 2 (two) times daily as needed (itchy dry eyes).   thyroid 60 MG tablet Commonly known as:  ARMOUR Take 60 mg by mouth daily before breakfast.   vitamin C 1000 MG tablet Take 1,000 mg by mouth daily.   Vitamin D 50 MCG (2000 UT) Caps Take 2,000 Units by mouth daily.   vitamin E 400 UNIT capsule Take 400 Units by mouth daily.        Signed: Ophelia Charter 01/31/2018, 2:06 PM

## 2018-01-31 NOTE — Evaluation (Signed)
Physical Therapy Evaluation and Discharge Patient Details Name: Molly Ferrell MRN: [id number] DOB: [birth year] Today's Date: 01/31/2018   History of Present Illness  This 67 y.o. female admitted for L4-5 PLIF due to spondylolisthesis and spinal stenosis.  PMH includes: macular degeration OU, OA, s/p Rt and Lt TKA   Clinical Impression  Patient evaluated by Physical Therapy with no further acute PT needs identified. All education has been completed and the patient has no further questions. At the time of PT eval pt was able to perform transfers and ambulation with up to modified independence and SPC for support. Pt was educated on precautions, brace application/wearing schedule, car transfer, and activity progression. See below for any follow-up Physical Therapy or equipment needs. PT is signing off. Thank you for this referral.     Follow Up Recommendations No PT follow up;Supervision - Intermittent    Equipment Recommendations  None recommended by PT    Recommendations for Other Services       Precautions / Restrictions Precautions Precautions: Back Precaution Booklet Issued: Yes (comment) Precaution Comments: reviewed back handout and precautions with pt.  She demonstrates good understanding  Required Braces or Orthoses: Spinal Brace Spinal Brace: Lumbar corset;Applied in sitting position      Mobility  Bed Mobility Overal bed mobility: Modified Independent             General bed mobility comments: HOB flat and rails lowered to simulate home environment. No assist required.   Transfers Overall transfer level: Modified independent Equipment used: None             General transfer comment: Pt demonstrated proper posture and hand placement on seated surface for safety.   Ambulation/Gait Ambulation/Gait assistance: Min guard;Modified independent (Device/Increase time) Gait Distance (Feet): 300 Feet Assistive device: None;Straight cane Gait Pattern/deviations:  Step-through pattern;Decreased stride length Gait velocity: Decreased Gait velocity interpretation: 1.31 - 2.62 ft/sec, indicative of limited community ambulator General Gait Details: Slow and guarded. Initially without AD and required hands-on support and hand on rail/wall for balance. With [facility] pt progressed to modified independent.   Stairs Stairs: Yes Stairs assistance: Min guard Stair Management: One rail Right;Step to pattern;Forwards Number of Stairs: 4 General stair comments: VC's for sequencing and general safety. No assist required and no LOB observed, however close guard provided for safety.   Wheelchair Mobility    Modified Rankin (Stroke Patients Only)       Balance Overall balance assessment: Mild deficits observed, not formally tested                                           Pertinent Vitals/Pain Pain Assessment: Faces Pain Score: 4  Faces Pain Scale: Hurts a little bit Pain Location: back  Pain Descriptors / Indicators: Operative site guarding Pain Intervention(s): Monitored during session;Repositioned    Home Living Family/patient expects to be discharged to:: Private residence Living Arrangements: Spouse/significant other Available Help at Discharge: Family;Available 24 hours/day Type of Home: House Home Access: Level entry     Home Layout: Two level Home Equipment: Walker - 2 wheels;Cane - single point;Shower seat - built Investment banker, operational      Prior Function Level of Independence: Independent         Comments: goes to the gym 3x/week      Hand Dominance   Dominant Hand: Right    Extremity/Trunk Assessment   Upper  Extremity Assessment Upper Extremity Assessment: Defer to OT evaluation    Lower Extremity Assessment Lower Extremity Assessment: Generalized weakness(Consistent with pre-op diagnosis and prior knee sx's)    Cervical / Trunk Assessment Cervical / Trunk Assessment: Other exceptions(s/p lumbar fusion  )  Communication   Communication: No difficulties  Cognition Arousal/Alertness: Awake/alert Behavior During Therapy: WFL for tasks assessed/performed Overall Cognitive Status: Within Functional Limits for tasks assessed                                        General Comments General comments (skin integrity, edema, etc.): Pt is able to don/doff back brace with supervision     Exercises     Assessment/Plan    PT Assessment Patent does not need any further PT services  PT Problem List         PT Treatment Interventions      PT Goals (Current goals can be found in the Care Plan section)  Acute Rehab PT Goals Patient Stated Goal: to get better and have less pain  PT Goal Formulation: All assessment and education complete, DC therapy    Frequency     Barriers to discharge        Co-evaluation               AM-PAC PT "6 Clicks" Mobility  Outcome Measure Help needed turning from your back to your side while in a flat bed without using bedrails?: None Help needed moving from lying on your back to sitting on the side of a flat bed without using bedrails?: None Help needed moving to and from a bed to a chair (including a wheelchair)?: None Help needed standing up from a chair using your arms (e.g., wheelchair or bedside chair)?: None Help needed to walk in hospital room?: None Help needed climbing 3-5 steps with a railing? : None 6 Click Score: 24    End of Session Equipment Utilized During Treatment: Gait belt;Back brace Activity Tolerance: Patient tolerated treatment well Patient left: in bed;with call bell/phone within reach Nurse Communication: Mobility status PT Visit Diagnosis: Unsteadiness on feet (R26.81);Pain Pain - part of body: (back)    Time: 4403-4742 PT Time Calculation (min) (ACUTE ONLY): 21 min   Charges:   PT Evaluation $PT Eval Low Complexity: 1 Low          Molly Ferrell, PT, DPT Acute Rehabilitation Services Pager:  [phone] Office: [phone]   Molly Ferrell 01/31/2018, 10:36 AM

## 2018-01-31 NOTE — Plan of Care (Signed)
  Problem: Education: Goal: Ability to verbalize activity precautions or restrictions will improve Outcome: Completed/Met Goal: Knowledge of the prescribed therapeutic regimen will improve Outcome: Completed/Met Goal: Understanding of discharge needs will improve Outcome: Completed/Met   Problem: Activity: Goal: Ability to avoid complications of mobility impairment will improve Outcome: Completed/Met Goal: Ability to tolerate increased activity will improve Outcome: Completed/Met Goal: Will remain free from falls Outcome: Completed/Met   Problem: Clinical Measurements: Goal: Ability to maintain clinical measurements within normal limits will improve Outcome: Completed/Met Goal: Postoperative complications will be avoided or minimized Outcome: Completed/Met Goal: Diagnostic test results will improve Outcome: Completed/Met   Problem: Pain Management: Goal: Pain level will decrease Outcome: Completed/Met   Problem: Health Behavior/Discharge Planning: Goal: Identification of resources available to assist in meeting health care needs will improve Outcome: Completed/Met

## 2018-01-31 NOTE — Evaluation (Addendum)
Occupational Therapy Evaluation Patient Details Name: Molly Ferrell MRN: [id number] DOB: 1950/04/09 Today's Date: 01/31/2018    History of Present Illness This 67 y.o. female admitted for L4-5 PLIF due to spondylolisthesis and spinal stenosis.  PMH includes: macular degeration OU, OA, s/p Rt and Lt TKA    Clinical Impression   Patient evaluated by Occupational Therapy with no further acute OT needs identified. All education has been completed and the patient has no further questions. All education completed.  Pt is able to perform ADLs at supervision - mod I level. She has good support from family.  See below for any follow-up Occupational Therapy or equipment needs. OT is signing off. Thank you for this referral.      Follow Up Recommendations  No OT follow up;Supervision - Intermittent    Equipment Recommendations  None recommended by OT    Recommendations for Other Services       Precautions / Restrictions Precautions Precautions: Back Precaution Booklet Issued: Yes (comment) Precaution Comments: reviewed back handout and precautions with pt.  She demonstrates good understanding       Mobility Bed Mobility Overal bed mobility: Modified Independent             General bed mobility comments: with use of rails   Transfers Overall transfer level: Modified independent                    Balance Overall balance assessment: Mild deficits observed, not formally tested                                         ADL either performed or assessed with clinical judgement   ADL Overall ADL's : Needs assistance/impaired Eating/Feeding: Independent   Grooming: Wash/dry hands;Wash/dry face;Oral care;Brushing hair;Supervision/safety;Standing Grooming Details (indicate cue type and reason): reviewed safe techniques for oral care and grooming  Upper Body Bathing: Supervision/ safety;Sitting;Standing   Lower Body Bathing: Supervison/ safety;Sit  to/from stand   Upper Body Dressing : Supervision/safety;Sitting;Standing   Lower Body Dressing: Supervision/safety;Sit to/from stand Lower Body Dressing Details (indicate cue type and reason): able to cross ankles over knees to access feet  Toilet Transfer: Modified Independent;Ambulation;Comfort height toilet   Toileting- Clothing Manipulation and Hygiene: Modified independent;Sit to/from stand   Tub/ Shower Transfer: Walk-in shower;Supervision/safety;Ambulation;Shower seat   Functional mobility during ADLs: Supervision/safety       Vision         Perception     Praxis      Pertinent Vitals/Pain Pain Assessment: 0-10 Pain Score: 4  Pain Location: back  Pain Descriptors / Indicators: Operative site guarding Pain Intervention(s): Monitored during session     Hand Dominance Right   Extremity/Trunk Assessment Upper Extremity Assessment Upper Extremity Assessment: Overall WFL for tasks assessed   Lower Extremity Assessment Lower Extremity Assessment: Defer to PT evaluation   Cervical / Trunk Assessment Cervical / Trunk Assessment: Other exceptions(s/p lumbar fusion )   Communication Communication Communication: No difficulties   Cognition Arousal/Alertness: Awake/alert Behavior During Therapy: WFL for tasks assessed/performed Overall Cognitive Status: Within Functional Limits for tasks assessed                                     General Comments  Pt is able to don/doff back brace with supervision     Exercises  Shoulder Instructions      Home Living Family/patient expects to be discharged to:: Private residence Living Arrangements: Spouse/significant other Available Help at Discharge: Family;Available 24 hours/day Type of Home: House Home Access: Level entry     Home Layout: Two level Alternate Level Stairs-Number of Steps: 4 Alternate Level Stairs-Rails: Right Bathroom Shower/Tub: Hospital doctor Toilet: Handicapped  height     Home Equipment: Environmental consultant - 2 wheels;Cane - single point;Shower seat - built Education officer, museum        Prior Functioning/Environment Level of Independence: Independent        Comments: goes to the gym 3x/week         OT Problem List: Pain      OT Treatment/Interventions:      OT Goals(Current goals can be found in the care plan section) Acute Rehab OT Goals Patient Stated Goal: to get better and have less pain  OT Goal Formulation: All assessment and education complete, DC therapy  OT Frequency:     Barriers to D/C:            Co-evaluation              AM-PAC OT "6 Clicks" Daily Activity     Outcome Measure Help from another person eating meals?: None Help from another person taking care of personal grooming?: None Help from another person toileting, which includes using toliet, bedpan, or urinal?: None Help from another person bathing (including washing, rinsing, drying)?: None Help from another person to put on and taking off regular upper body clothing?: None Help from another person to put on and taking off regular lower body clothing?: None 6 Click Score: 24   End of Session Equipment Utilized During Treatment: Back brace Nurse Communication: Mobility status  Activity Tolerance: Patient tolerated treatment well Patient left: with call bell/phone within reach;in chair  OT Visit Diagnosis: Pain Pain - part of body: (back )                Time: 8416-6063 OT Time Calculation (min): 16 min Charges:  OT General Charges $OT Visit: 1 Visit OT Evaluation $OT Eval Low Complexity: 1 Low Lucille Passy, OTR/L Acute Rehabilitation Services Pager [phone] Office [place], OTR/L Acute Rehabilitation Services Pager [phone] Office [phone]   Lucille Passy M 01/31/2018, 10:17 AM

## 2018-02-04 MED FILL — Heparin Sodium (Porcine) Inj 1000 Unit/ML: INTRAMUSCULAR | Qty: 30 | Status: AC

## 2018-02-04 MED FILL — Sodium Chloride IV Soln 0.9%: INTRAVENOUS | Qty: 1000 | Status: AC

## 2018-02-26 DIAGNOSIS — M4316 Spondylolisthesis, lumbar region: Secondary | ICD-10-CM | POA: Diagnosis not present

## 2018-03-05 DIAGNOSIS — R531 Weakness: Secondary | ICD-10-CM | POA: Diagnosis not present

## 2018-03-05 DIAGNOSIS — E2740 Unspecified adrenocortical insufficiency: Secondary | ICD-10-CM | POA: Diagnosis not present

## 2018-03-05 DIAGNOSIS — E2839 Other primary ovarian failure: Secondary | ICD-10-CM | POA: Diagnosis not present

## 2018-03-05 DIAGNOSIS — E039 Hypothyroidism, unspecified: Secondary | ICD-10-CM | POA: Diagnosis not present

## 2018-03-26 DIAGNOSIS — Z1231 Encounter for screening mammogram for malignant neoplasm of breast: Secondary | ICD-10-CM | POA: Diagnosis not present

## 2018-06-03 DIAGNOSIS — M4316 Spondylolisthesis, lumbar region: Secondary | ICD-10-CM | POA: Diagnosis not present

## 2018-06-04 DIAGNOSIS — M4316 Spondylolisthesis, lumbar region: Secondary | ICD-10-CM | POA: Diagnosis not present

## 2018-08-30 DIAGNOSIS — Z961 Presence of intraocular lens: Secondary | ICD-10-CM | POA: Diagnosis not present

## 2018-08-30 DIAGNOSIS — H04123 Dry eye syndrome of bilateral lacrimal glands: Secondary | ICD-10-CM | POA: Diagnosis not present

## 2018-08-30 DIAGNOSIS — H353112 Nonexudative age-related macular degeneration, right eye, intermediate dry stage: Secondary | ICD-10-CM | POA: Diagnosis not present

## 2018-08-30 DIAGNOSIS — H353123 Nonexudative age-related macular degeneration, left eye, advanced atrophic without subfoveal involvement: Secondary | ICD-10-CM | POA: Diagnosis not present

## 2018-09-19 DIAGNOSIS — E782 Mixed hyperlipidemia: Secondary | ICD-10-CM | POA: Diagnosis not present

## 2018-09-19 DIAGNOSIS — Z79899 Other long term (current) drug therapy: Secondary | ICD-10-CM | POA: Diagnosis not present

## 2018-09-19 DIAGNOSIS — Z96651 Presence of right artificial knee joint: Secondary | ICD-10-CM | POA: Diagnosis not present

## 2018-09-19 DIAGNOSIS — K219 Gastro-esophageal reflux disease without esophagitis: Secondary | ICD-10-CM | POA: Diagnosis not present

## 2018-09-19 DIAGNOSIS — E039 Hypothyroidism, unspecified: Secondary | ICD-10-CM | POA: Diagnosis not present

## 2018-09-19 DIAGNOSIS — Z96652 Presence of left artificial knee joint: Secondary | ICD-10-CM | POA: Diagnosis not present

## 2018-09-19 DIAGNOSIS — R5381 Other malaise: Secondary | ICD-10-CM | POA: Diagnosis not present

## 2018-09-19 DIAGNOSIS — R5383 Other fatigue: Secondary | ICD-10-CM | POA: Diagnosis not present

## 2018-09-24 DIAGNOSIS — Z79899 Other long term (current) drug therapy: Secondary | ICD-10-CM | POA: Diagnosis not present

## 2018-09-24 DIAGNOSIS — E782 Mixed hyperlipidemia: Secondary | ICD-10-CM | POA: Diagnosis not present

## 2018-10-01 DIAGNOSIS — Z6826 Body mass index (BMI) 26.0-26.9, adult: Secondary | ICD-10-CM | POA: Diagnosis not present

## 2018-10-01 DIAGNOSIS — R030 Elevated blood-pressure reading, without diagnosis of hypertension: Secondary | ICD-10-CM | POA: Diagnosis not present

## 2018-10-01 DIAGNOSIS — M4316 Spondylolisthesis, lumbar region: Secondary | ICD-10-CM | POA: Diagnosis not present

## 2018-10-22 DIAGNOSIS — E2839 Other primary ovarian failure: Secondary | ICD-10-CM | POA: Diagnosis not present

## 2018-10-22 DIAGNOSIS — E559 Vitamin D deficiency, unspecified: Secondary | ICD-10-CM | POA: Diagnosis not present

## 2018-10-22 DIAGNOSIS — E039 Hypothyroidism, unspecified: Secondary | ICD-10-CM | POA: Diagnosis not present

## 2018-11-05 DIAGNOSIS — E782 Mixed hyperlipidemia: Secondary | ICD-10-CM | POA: Diagnosis not present

## 2019-01-07 DIAGNOSIS — M4316 Spondylolisthesis, lumbar region: Secondary | ICD-10-CM | POA: Diagnosis not present

## 2019-01-07 DIAGNOSIS — M25551 Pain in right hip: Secondary | ICD-10-CM | POA: Diagnosis not present

## 2019-01-07 DIAGNOSIS — Z6827 Body mass index (BMI) 27.0-27.9, adult: Secondary | ICD-10-CM | POA: Diagnosis not present

## 2019-01-10 DIAGNOSIS — M4316 Spondylolisthesis, lumbar region: Secondary | ICD-10-CM | POA: Diagnosis not present

## 2019-01-10 DIAGNOSIS — M48061 Spinal stenosis, lumbar region without neurogenic claudication: Secondary | ICD-10-CM | POA: Diagnosis not present

## 2019-01-10 DIAGNOSIS — M5116 Intervertebral disc disorders with radiculopathy, lumbar region: Secondary | ICD-10-CM | POA: Diagnosis not present

## 2019-01-13 DIAGNOSIS — M4316 Spondylolisthesis, lumbar region: Secondary | ICD-10-CM | POA: Diagnosis not present

## 2019-01-13 DIAGNOSIS — M48062 Spinal stenosis, lumbar region with neurogenic claudication: Secondary | ICD-10-CM | POA: Diagnosis not present

## 2019-01-17 ENCOUNTER — Other Ambulatory Visit: Payer: Self-pay | Admitting: Neurosurgery

## 2019-02-26 NOTE — Pre-Procedure Instructions (Signed)
[place], [place] [address] Phone: [phone] Fax: [phone]    Your procedure is scheduled on Mon., Jan. 4, 2020 from 7:30AM-11:18AM  Report to [facility] Entrance " A" at 5:30AM  Call this number if you have problems the morning of surgery:  [phone]   Remember:  Do not eat or drink after midnight on Jan. 3rd    Take these medicines the morning of surgery with A SIP OF WATER: Estradiol (ESTRACE)  Omeprazole (PRILOSEC)  Propylene Glycol (SYSTANE COMPLETE) Thyroid (ARMOUR)  As of today, stop taking all Aspirin (unless instructed by your doctor) and Other Aspirin containing products, Vitamins, Fish oils, and Herbal medications. Also stop all NSAIDS i.e. Advil, Ibuprofen, Motrin, Aleve, Anaprox, Naproxen, BC, Goody Powders, and all Supplements.  No Smoking of any kind, Tobacco, or Alcohol products 24 hours prior to your procedure. If you use a Cpap at night, you may bring the machine and all equipment for your overnight stay.   Special instructions:  [place]- Preparing For Surgery  Before surgery, you can play an important role. Because skin is not sterile, your skin needs to be as free of germs as possible. You can reduce the number of germs on your skin by washing with CHG (chlorahexidine gluconate) Soap before surgery.  CHG is an antiseptic cleaner which kills germs and bonds with the skin to continue killing germs even after washing.    Please do not use if you have an allergy to CHG or antibacterial soaps. If your skin becomes reddened/irritated stop using the CHG.  Do not shave (including legs and underarms) for at least 48 hours prior to first CHG shower. It is OK to shave your face.  Please follow these instructions carefully.   1. Shower the NIGHT BEFORE SURGERY and the MORNING OF SURGERY with CHG.   2. If you chose to wash your hair, wash your hair first as usual with your normal  shampoo.  3. After you shampoo, rinse your hair and body thoroughly to remove the shampoo.  4. Use CHG as you would any other liquid soap. You can apply CHG directly to the skin and wash gently with a scrungie or a clean washcloth.   5. Apply the CHG Soap to your body ONLY FROM THE NECK DOWN.  Do not use on open wounds or open sores. Avoid contact with your eyes, ears, mouth and genitals (private parts). Wash Face and genitals (private parts)  with your normal soap.  6. Wash thoroughly, paying special attention to the area where your surgery will be performed.  7. Thoroughly rinse your body with warm water from the neck down.  8. DO NOT shower/wash with your normal soap after using and rinsing off the CHG Soap.  9. Pat yourself dry with a CLEAN TOWEL.  10. Wear CLEAN PAJAMAS to bed the night before surgery, wear comfortable clothes the morning of surgery  11. Place CLEAN SHEETS on your bed the night of your first shower and DO NOT SLEEP WITH PETS.   Day of Surgery:            Remember to brush your teeth WITH YOUR REGULAR TOOTHPASTE.  Do not wear jewelry, make-up or nail polish.  Do not wear lotions, powders, or perfumes, or deodorant.  Do not shave 48 hours prior to surgery.    Do not bring valuables to the hospital.  [facility] is not responsible  for any belongings or valuables.  Contacts, dentures or bridgework may not be worn into surgery.    For patients admitted to the hospital, discharge time will be determined by your treatment team.  Patients discharged the day of surgery will not be allowed to drive home, and someone age 61 and over needs to stay with them for 24 hours.  Please wear clean clothes to the hospital/surgery center.    Please read over the following fact sheets that you were given.

## 2019-02-27 ENCOUNTER — Encounter (HOSPITAL_COMMUNITY)
Admission: RE | Admit: 2019-02-27 | Discharge: 2019-02-27 | Disposition: A | Discharge status: Home / Self Care | Payer: PPO | Source: Ambulatory Visit | Attending: Neurosurgery | Admitting: Neurosurgery

## 2019-02-27 ENCOUNTER — Encounter (HOSPITAL_COMMUNITY): Payer: Self-pay

## 2019-02-27 ENCOUNTER — Other Ambulatory Visit: Payer: Self-pay

## 2019-02-27 ENCOUNTER — Other Ambulatory Visit (HOSPITAL_COMMUNITY)
Admission: RE | Admit: 2019-02-27 | Discharge: 2019-02-27 | Disposition: A | Discharge status: Home / Self Care | Payer: PPO | Source: Ambulatory Visit | Attending: Neurosurgery | Admitting: Neurosurgery

## 2019-02-27 DIAGNOSIS — Z20828 Contact with and (suspected) exposure to other viral communicable diseases: Secondary | ICD-10-CM | POA: Diagnosis not present

## 2019-02-27 DIAGNOSIS — M4316 Spondylolisthesis, lumbar region: Secondary | ICD-10-CM | POA: Insufficient documentation

## 2019-02-27 DIAGNOSIS — Z01812 Encounter for preprocedural laboratory examination: Secondary | ICD-10-CM | POA: Insufficient documentation

## 2019-02-27 LAB — CBC
HCT: 51.1 % — ABNORMAL HIGH (ref 36.0–46.0)
Hemoglobin: 17.4 g/dL — ABNORMAL HIGH (ref 12.0–15.0)
MCH: 31.1 pg (ref 26.0–34.0)
MCHC: 34.1 g/dL (ref 30.0–36.0)
MCV: 91.4 fL (ref 80.0–100.0)
Platelets: 218 10*3/uL (ref 150–400)
RBC: 5.59 MIL/uL — ABNORMAL HIGH (ref 3.87–5.11)
RDW: 12.2 % (ref 11.5–15.5)
WBC: 8.5 10*3/uL (ref 4.0–10.5)
nRBC: 0 % (ref 0.0–0.2)

## 2019-02-27 LAB — TYPE AND SCREEN
ABO/RH(D): A POS
Antibody Screen: NEGATIVE

## 2019-02-27 LAB — SURGICAL PCR SCREEN
MRSA, PCR: NEGATIVE
Staphylococcus aureus: NEGATIVE

## 2019-02-27 NOTE — Progress Notes (Signed)
PCP - Dr. Bea Graff Cardiologist - Denies  PPM/ICD - Denies Device Orders -  Rep Notified -   Chest x-ray - Denies EKG - Denies Stress Test - Denies ECHO - Denies Cardiac Cath - Denies  Sleep Study - Denies  Patient denies being diabetic.  Aspirin Instructions: Patient stated that she stopped taking 02/19/2019.  COVID TEST- Done 02/27/2019, pending.   Anesthesia review: No   Coronavirus Screening  Have you experienced the following symptoms:  Cough yes/no: No Fever (>100.51F)  yes/no: No Runny nose yes/no: No Sore throat yes/no: No Difficulty breathing/shortness of breath  yes/no: No  Have you or a family member traveled in the last 14 days and where? yes/no: No   If the patient indicates "YES" to the above questions, their PAT will be rescheduled to limit the exposure to others and, the surgeon will be notified. THE PATIENT WILL NEED TO BE ASYMPTOMATIC FOR 14 DAYS.   If the patient is not experiencing any of these symptoms, the PAT nurse will instruct them to NOT bring anyone with them to their appointment since they may have these symptoms or traveled as well.   Please remind your patients and families that hospital visitation restrictions are in effect and the importance of the restrictions.    Patient denies shortness of breath, fever, cough and chest pain at PAT appointment   All instructions explained to the patient, with a verbal understanding of the material. Patient agrees to go over the instructions while at home for a better understanding. Patient also instructed to self quarantine after being tested for COVID-19. The opportunity to ask questions was provided.

## 2019-02-28 LAB — NOVEL CORONAVIRUS, NAA (HOSP ORDER, SEND-OUT TO REF LAB; TAT 18-24 HRS): SARS-CoV-2, NAA: NOT DETECTED

## 2019-03-03 ENCOUNTER — Encounter (HOSPITAL_COMMUNITY)
Admission: RE | Disposition: A | Discharge status: Home / Self Care | Payer: Self-pay | Source: Home / Self Care | Attending: Neurosurgery

## 2019-03-03 ENCOUNTER — Inpatient Hospital Stay (HOSPITAL_COMMUNITY)
Admission: RE | Admit: 2019-03-03 | Discharge: 2019-03-04 | DRG: 455 | Disposition: A | Discharge status: Home / Self Care | Payer: PPO | Attending: Neurosurgery | Admitting: Neurosurgery

## 2019-03-03 ENCOUNTER — Inpatient Hospital Stay (HOSPITAL_COMMUNITY): Payer: PPO

## 2019-03-03 ENCOUNTER — Inpatient Hospital Stay (HOSPITAL_COMMUNITY): Payer: PPO | Admitting: Certified Registered Nurse Anesthetist

## 2019-03-03 ENCOUNTER — Other Ambulatory Visit: Payer: Self-pay

## 2019-03-03 ENCOUNTER — Encounter (HOSPITAL_COMMUNITY): Payer: Self-pay | Admitting: Neurosurgery

## 2019-03-03 DIAGNOSIS — H9190 Unspecified hearing loss, unspecified ear: Secondary | ICD-10-CM | POA: Diagnosis not present

## 2019-03-03 DIAGNOSIS — Z961 Presence of intraocular lens: Secondary | ICD-10-CM | POA: Diagnosis present

## 2019-03-03 DIAGNOSIS — M4316 Spondylolisthesis, lumbar region: Secondary | ICD-10-CM | POA: Diagnosis not present

## 2019-03-03 DIAGNOSIS — E7800 Pure hypercholesterolemia, unspecified: Secondary | ICD-10-CM | POA: Diagnosis not present

## 2019-03-03 DIAGNOSIS — M479 Spondylosis, unspecified: Secondary | ICD-10-CM | POA: Diagnosis not present

## 2019-03-03 DIAGNOSIS — M5116 Intervertebral disc disorders with radiculopathy, lumbar region: Secondary | ICD-10-CM | POA: Diagnosis not present

## 2019-03-03 DIAGNOSIS — Z880 Allergy status to penicillin: Secondary | ICD-10-CM

## 2019-03-03 DIAGNOSIS — Z9842 Cataract extraction status, left eye: Secondary | ICD-10-CM | POA: Diagnosis not present

## 2019-03-03 DIAGNOSIS — Z9841 Cataract extraction status, right eye: Secondary | ICD-10-CM | POA: Diagnosis not present

## 2019-03-03 DIAGNOSIS — K219 Gastro-esophageal reflux disease without esophagitis: Secondary | ICD-10-CM | POA: Diagnosis not present

## 2019-03-03 DIAGNOSIS — E039 Hypothyroidism, unspecified: Secondary | ICD-10-CM | POA: Diagnosis not present

## 2019-03-03 DIAGNOSIS — Z96653 Presence of artificial knee joint, bilateral: Secondary | ICD-10-CM | POA: Diagnosis not present

## 2019-03-03 DIAGNOSIS — M1711 Unilateral primary osteoarthritis, right knee: Secondary | ICD-10-CM | POA: Diagnosis present

## 2019-03-03 DIAGNOSIS — Z981 Arthrodesis status: Secondary | ICD-10-CM | POA: Diagnosis not present

## 2019-03-03 DIAGNOSIS — Z8249 Family history of ischemic heart disease and other diseases of the circulatory system: Secondary | ICD-10-CM | POA: Diagnosis not present

## 2019-03-03 DIAGNOSIS — M48062 Spinal stenosis, lumbar region with neurogenic claudication: Secondary | ICD-10-CM | POA: Diagnosis not present

## 2019-03-03 DIAGNOSIS — M960 Pseudarthrosis after fusion or arthrodesis: Secondary | ICD-10-CM | POA: Diagnosis not present

## 2019-03-03 DIAGNOSIS — Z90710 Acquired absence of both cervix and uterus: Secondary | ICD-10-CM | POA: Diagnosis not present

## 2019-03-03 DIAGNOSIS — Z7982 Long term (current) use of aspirin: Secondary | ICD-10-CM | POA: Diagnosis not present

## 2019-03-03 DIAGNOSIS — Z419 Encounter for procedure for purposes other than remedying health state, unspecified: Secondary | ICD-10-CM

## 2019-03-03 DIAGNOSIS — Z20822 Contact with and (suspected) exposure to covid-19: Secondary | ICD-10-CM | POA: Diagnosis not present

## 2019-03-03 DIAGNOSIS — Z87891 Personal history of nicotine dependence: Secondary | ICD-10-CM | POA: Diagnosis not present

## 2019-03-03 LAB — CREATININE, SERUM
Creatinine, Ser: 0.98 mg/dL (ref 0.44–1.00)
GFR calc Af Amer: >60 mL/min (ref >60)
GFR calc non Af Amer: 58 mL/min — ABNORMAL LOW (ref >60)

## 2019-03-03 SURGERY — POSTERIOR LUMBAR FUSION 1 LEVEL
Anesthesia: General

## 2019-03-03 MED ORDER — VANCOMYCIN HCL 500 MG/100ML IV SOLN
500.0000 mg | Freq: Once | INTRAVENOUS | Status: AC
  Administered 2019-03-03: 500 mg via INTRAVENOUS
  Filled 2019-03-03: qty 100

## 2019-03-03 MED ORDER — ONDANSETRON HCL 4 MG/2ML IJ SOLN
INTRAMUSCULAR | Status: AC
  Filled 2019-03-03: qty 2

## 2019-03-03 MED ORDER — LACTATED RINGERS IV SOLN: INTRAVENOUS | Status: DC

## 2019-03-03 MED ORDER — ATORVASTATIN CALCIUM 10 MG PO TABS
10.0000 mg | ORAL_TABLET | Freq: Every day | ORAL | Status: DC
  Administered 2019-03-03: 10 mg via ORAL
  Filled 2019-03-03: qty 1

## 2019-03-03 MED ORDER — BISACODYL 10 MG RE SUPP: 10.0000 mg | Freq: Every day | RECTAL | Status: DC | PRN

## 2019-03-03 MED ORDER — ONDANSETRON HCL 4 MG/2ML IJ SOLN
INTRAMUSCULAR | Status: DC | PRN
  Administered 2019-03-03: 4 mg via INTRAVENOUS

## 2019-03-03 MED ORDER — VANCOMYCIN HCL IN DEXTROSE 1-5 GM/200ML-% IV SOLN
INTRAVENOUS | Status: AC
  Filled 2019-03-03: qty 200

## 2019-03-03 MED ORDER — OXYCODONE HCL 5 MG PO TABS
ORAL_TABLET | ORAL | Status: AC
  Filled 2019-03-03: qty 1

## 2019-03-03 MED ORDER — GLYCOPYRROLATE PF 0.2 MG/ML IJ SOSY
PREFILLED_SYRINGE | INTRAMUSCULAR | Status: AC
  Filled 2019-03-03: qty 1

## 2019-03-03 MED ORDER — OXYCODONE HCL 5 MG PO TABS: 5.0000 mg | ORAL_TABLET | ORAL | Status: DC | PRN

## 2019-03-03 MED ORDER — PANTOPRAZOLE SODIUM 40 MG PO TBEC
80.0000 mg | DELAYED_RELEASE_TABLET | Freq: Every day | ORAL | Status: DC
  Administered 2019-03-04: 80 mg via ORAL
  Filled 2019-03-03: qty 2

## 2019-03-03 MED ORDER — SODIUM CHLORIDE 0.9% FLUSH: 3.0000 mL | INTRAVENOUS | Status: DC | PRN

## 2019-03-03 MED ORDER — BUPIVACAINE LIPOSOME 1.3 % IJ SUSP
20.0000 mL | Freq: Once | INTRAMUSCULAR | Status: DC
  Filled 2019-03-03: qty 20

## 2019-03-03 MED ORDER — DEXAMETHASONE SODIUM PHOSPHATE 10 MG/ML IJ SOLN
INTRAMUSCULAR | Status: DC | PRN
  Administered 2019-03-03: 5 mg via INTRAVENOUS

## 2019-03-03 MED ORDER — CHLORHEXIDINE GLUCONATE CLOTH 2 % EX PADS: 6.0000 | MEDICATED_PAD | Freq: Once | CUTANEOUS | Status: DC

## 2019-03-03 MED ORDER — PHENYLEPHRINE HCL-NACL 10-0.9 MG/250ML-% IV SOLN
INTRAVENOUS | Status: DC | PRN
  Administered 2019-03-03: 40 ug/min via INTRAVENOUS

## 2019-03-03 MED ORDER — ESTRADIOL 2 MG PO TABS
2.0000 mg | ORAL_TABLET | Freq: Every day | ORAL | Status: DC
  Administered 2019-03-04: 2 mg via ORAL
  Filled 2019-03-03: qty 1

## 2019-03-03 MED ORDER — OXYCODONE HCL 5 MG PO TABS
10.0000 mg | ORAL_TABLET | ORAL | Status: DC | PRN
  Administered 2019-03-03 – 2019-03-04 (×4): 10 mg via ORAL
  Filled 2019-03-03 (×4): qty 2

## 2019-03-03 MED ORDER — PHENOL 1.4 % MT LIQD: 1.0000 | OROMUCOSAL | Status: DC | PRN

## 2019-03-03 MED ORDER — BUPIVACAINE-EPINEPHRINE (PF) 0.25% -1:200000 IJ SOLN
INTRAMUSCULAR | Status: AC
  Filled 2019-03-03: qty 10

## 2019-03-03 MED ORDER — TESTOSTERONE POWD: 1.0000 "application " | Freq: Every day | Status: DC

## 2019-03-03 MED ORDER — FENTANYL CITRATE (PF) 250 MCG/5ML IJ SOLN
INTRAMUSCULAR | Status: DC | PRN
  Administered 2019-03-03: 50 ug via INTRAVENOUS
  Administered 2019-03-03: 100 ug via INTRAVENOUS
  Administered 2019-03-03 (×2): 50 ug via INTRAVENOUS

## 2019-03-03 MED ORDER — POLYVINYL ALCOHOL 1.4 % OP SOLN
1.0000 [drp] | Freq: Every day | OPHTHALMIC | Status: DC
  Filled 2019-03-03 (×2): qty 15

## 2019-03-03 MED ORDER — MORPHINE SULFATE (PF) 4 MG/ML IV SOLN: 4.0000 mg | INTRAVENOUS | Status: DC | PRN

## 2019-03-03 MED ORDER — BUPIVACAINE LIPOSOME 1.3 % IJ SUSP
INTRAMUSCULAR | Status: DC | PRN
  Administered 2019-03-03: 20 mL

## 2019-03-03 MED ORDER — SODIUM CHLORIDE 0.9 % IV SOLN: 250.0000 mL | INTRAVENOUS | Status: DC

## 2019-03-03 MED ORDER — GLYCOPYRROLATE 0.2 MG/ML IJ SOLN
INTRAMUSCULAR | Status: DC | PRN
  Administered 2019-03-03 (×2): .1 mg via INTRAVENOUS

## 2019-03-03 MED ORDER — 0.9 % SODIUM CHLORIDE (POUR BTL) OPTIME
TOPICAL | Status: DC | PRN
  Administered 2019-03-03: 1000 mL

## 2019-03-03 MED ORDER — BACITRACIN ZINC 500 UNIT/GM EX OINT
TOPICAL_OINTMENT | CUTANEOUS | Status: AC
  Filled 2019-03-03: qty 28.35

## 2019-03-03 MED ORDER — MENTHOL 3 MG MT LOZG: 1.0000 | LOZENGE | OROMUCOSAL | Status: DC | PRN

## 2019-03-03 MED ORDER — HYDROMORPHONE HCL 1 MG/ML IJ SOLN
0.2500 mg | INTRAMUSCULAR | Status: DC | PRN
  Administered 2019-03-03 (×2): 0.5 mg via INTRAVENOUS

## 2019-03-03 MED ORDER — PHENYLEPHRINE HCL-NACL 10-0.9 MG/250ML-% IV SOLN
INTRAVENOUS | Status: AC
  Filled 2019-03-03: qty 250

## 2019-03-03 MED ORDER — LIDOCAINE 2% (20 MG/ML) 5 ML SYRINGE
INTRAMUSCULAR | Status: AC
  Filled 2019-03-03: qty 5

## 2019-03-03 MED ORDER — HYDROMORPHONE HCL 1 MG/ML IJ SOLN
INTRAMUSCULAR | Status: AC
  Filled 2019-03-03: qty 1

## 2019-03-03 MED ORDER — BUPIVACAINE-EPINEPHRINE 0.25% -1:200000 IJ SOLN
INTRAMUSCULAR | Status: DC | PRN
  Administered 2019-03-03: 10 mL

## 2019-03-03 MED ORDER — DEXAMETHASONE SODIUM PHOSPHATE 10 MG/ML IJ SOLN
INTRAMUSCULAR | Status: AC
  Filled 2019-03-03: qty 1

## 2019-03-03 MED ORDER — ROCURONIUM BROMIDE 10 MG/ML (PF) SYRINGE
PREFILLED_SYRINGE | INTRAVENOUS | Status: DC | PRN
  Administered 2019-03-03 (×2): 10 mg via INTRAVENOUS
  Administered 2019-03-03: 60 mg via INTRAVENOUS

## 2019-03-03 MED ORDER — MIDAZOLAM HCL 2 MG/2ML IJ SOLN
INTRAMUSCULAR | Status: AC
  Filled 2019-03-03: qty 2

## 2019-03-03 MED ORDER — ROCURONIUM BROMIDE 10 MG/ML (PF) SYRINGE
PREFILLED_SYRINGE | INTRAVENOUS | Status: AC
  Filled 2019-03-03: qty 10

## 2019-03-03 MED ORDER — METHOCARBAMOL 500 MG PO TABS
ORAL_TABLET | ORAL | Status: AC
  Filled 2019-03-03: qty 1

## 2019-03-03 MED ORDER — ACETAMINOPHEN 650 MG RE SUPP: 650.0000 mg | RECTAL | Status: DC | PRN

## 2019-03-03 MED ORDER — ONDANSETRON HCL 4 MG/2ML IJ SOLN
4.0000 mg | Freq: Four times a day (QID) | INTRAMUSCULAR | Status: DC | PRN
  Administered 2019-03-03: 4 mg via INTRAVENOUS
  Filled 2019-03-03: qty 2

## 2019-03-03 MED ORDER — SODIUM CHLORIDE 0.9 % IV SOLN
INTRAVENOUS | Status: DC | PRN
  Administered 2019-03-03: 500 mL

## 2019-03-03 MED ORDER — THROMBIN 5000 UNITS EX SOLR
OROMUCOSAL | Status: DC | PRN
  Administered 2019-03-03: 5 mL via TOPICAL

## 2019-03-03 MED ORDER — SUGAMMADEX SODIUM 200 MG/2ML IV SOLN
INTRAVENOUS | Status: DC | PRN
  Administered 2019-03-03: 130 mg via INTRAVENOUS

## 2019-03-03 MED ORDER — THYROID 60 MG PO TABS
60.0000 mg | ORAL_TABLET | Freq: Every day | ORAL | Status: DC
  Administered 2019-03-04: 60 mg via ORAL
  Filled 2019-03-03: qty 1

## 2019-03-03 MED ORDER — VANCOMYCIN HCL IN DEXTROSE 1-5 GM/200ML-% IV SOLN
1000.0000 mg | INTRAVENOUS | Status: AC
  Administered 2019-03-03: 1000 mg via INTRAVENOUS

## 2019-03-03 MED ORDER — OXYCODONE HCL 5 MG PO TABS
5.0000 mg | ORAL_TABLET | Freq: Once | ORAL | Status: AC | PRN
  Administered 2019-03-03: 5 mg via ORAL

## 2019-03-03 MED ORDER — PROPOFOL 10 MG/ML IV BOLUS
INTRAVENOUS | Status: AC
  Filled 2019-03-03: qty 20

## 2019-03-03 MED ORDER — CYCLOBENZAPRINE HCL 10 MG PO TABS: 10.0000 mg | ORAL_TABLET | Freq: Three times a day (TID) | ORAL | Status: DC | PRN

## 2019-03-03 MED ORDER — MIDAZOLAM HCL 2 MG/2ML IJ SOLN
INTRAMUSCULAR | Status: DC | PRN
  Administered 2019-03-03: 2 mg via INTRAVENOUS

## 2019-03-03 MED ORDER — SODIUM CHLORIDE 0.9% FLUSH: 3.0000 mL | Freq: Two times a day (BID) | INTRAVENOUS | Status: DC

## 2019-03-03 MED ORDER — OXYCODONE HCL 5 MG/5ML PO SOLN: 5.0000 mg | Freq: Once | ORAL | Status: AC | PRN

## 2019-03-03 MED ORDER — BACITRACIN ZINC 500 UNIT/GM EX OINT
TOPICAL_OINTMENT | CUTANEOUS | Status: DC | PRN
  Administered 2019-03-03: 1 via TOPICAL

## 2019-03-03 MED ORDER — PROMETHAZINE HCL 25 MG/ML IJ SOLN: 6.2500 mg | INTRAMUSCULAR | Status: DC | PRN

## 2019-03-03 MED ORDER — ACETAMINOPHEN 500 MG PO TABS
1000.0000 mg | ORAL_TABLET | Freq: Four times a day (QID) | ORAL | Status: AC
  Administered 2019-03-03 – 2019-03-04 (×4): 1000 mg via ORAL
  Filled 2019-03-03 (×4): qty 2

## 2019-03-03 MED ORDER — DOCUSATE SODIUM 100 MG PO CAPS
100.0000 mg | ORAL_CAPSULE | Freq: Two times a day (BID) | ORAL | Status: DC
  Administered 2019-03-03 – 2019-03-04 (×2): 100 mg via ORAL
  Filled 2019-03-03 (×2): qty 1

## 2019-03-03 MED ORDER — LUTEIN 40 MG PO CAPS: 40.0000 mg | ORAL_CAPSULE | Freq: Two times a day (BID) | ORAL | Status: DC

## 2019-03-03 MED ORDER — FENTANYL CITRATE (PF) 250 MCG/5ML IJ SOLN
INTRAMUSCULAR | Status: AC
  Filled 2019-03-03: qty 5

## 2019-03-03 MED ORDER — NIACIN 500 MG PO TABS
500.0000 mg | ORAL_TABLET | Freq: Two times a day (BID) | ORAL | Status: DC
  Administered 2019-03-03 – 2019-03-04 (×2): 500 mg via ORAL
  Filled 2019-03-03 (×2): qty 1

## 2019-03-03 MED ORDER — THROMBIN 5000 UNITS EX SOLR
CUTANEOUS | Status: AC
  Filled 2019-03-03: qty 5000

## 2019-03-03 MED ORDER — ACETAMINOPHEN 325 MG PO TABS: 650.0000 mg | ORAL_TABLET | ORAL | Status: DC | PRN

## 2019-03-03 MED ORDER — BUPIVACAINE-EPINEPHRINE (PF) 0.25% -1:200000 IJ SOLN
INTRAMUSCULAR | Status: AC
  Filled 2019-03-03: qty 20

## 2019-03-03 MED ORDER — ONDANSETRON HCL 4 MG PO TABS: 4.0000 mg | ORAL_TABLET | Freq: Four times a day (QID) | ORAL | Status: DC | PRN

## 2019-03-03 MED ORDER — LIDOCAINE HCL (CARDIAC) PF 100 MG/5ML IV SOSY
PREFILLED_SYRINGE | INTRAVENOUS | Status: DC | PRN
  Administered 2019-03-03: 60 mg via INTRAVENOUS

## 2019-03-03 MED ORDER — ZOLPIDEM TARTRATE 5 MG PO TABS: 5.0000 mg | ORAL_TABLET | Freq: Every evening | ORAL | Status: DC | PRN

## 2019-03-03 MED ORDER — PROPOFOL 10 MG/ML IV BOLUS
INTRAVENOUS | Status: DC | PRN
  Administered 2019-03-03: 120 mg via INTRAVENOUS

## 2019-03-03 MED ORDER — HYDROXYZINE HCL 50 MG/ML IM SOLN
50.0000 mg | Freq: Four times a day (QID) | INTRAMUSCULAR | Status: DC | PRN
  Administered 2019-03-03: 50 mg via INTRAMUSCULAR
  Filled 2019-03-03: qty 1

## 2019-03-03 SURGICAL SUPPLY — 63 items
BAG DECANTER FOR FLEXI CONT (MISCELLANEOUS) ×3 IMPLANT
BENZOIN TINCTURE PRP APPL 2/3 (GAUZE/BANDAGES/DRESSINGS) ×3 IMPLANT
BLADE CLIPPER SURG (BLADE) IMPLANT
BUR MATCHSTICK NEURO 3.0 LAGG (BURR) ×3 IMPLANT
BUR PRECISION FLUTE 6.0 (BURR) ×3 IMPLANT
CAGE ALTERA 10X31X9-13 15D (Cage) ×2 IMPLANT
CAGE ALTERA 9-13-15-31MM (Cage) ×1 IMPLANT
CANISTER SUCT 3000ML PPV (MISCELLANEOUS) ×3 IMPLANT
CAP LOCK DLX THRD (Cap) ×6 IMPLANT
CAP REVERE LOCKING (Cap) ×12 IMPLANT
CARTRIDGE OIL MAESTRO DRILL (MISCELLANEOUS) ×1 IMPLANT
CLOSURE WOUND 1/2 X4 (GAUZE/BANDAGES/DRESSINGS) ×1
CONT SPEC 4OZ CLIKSEAL STRL BL (MISCELLANEOUS) ×3 IMPLANT
COVER BACK TABLE 60X90IN (DRAPES) ×3 IMPLANT
COVER WAND RF STERILE (DRAPES) IMPLANT
DECANTER SPIKE VIAL GLASS SM (MISCELLANEOUS) ×3 IMPLANT
DIFFUSER DRILL AIR PNEUMATIC (MISCELLANEOUS) ×3 IMPLANT
DRAPE C-ARM 42X72 X-RAY (DRAPES) ×6 IMPLANT
DRAPE HALF SHEET 40X57 (DRAPES) ×3 IMPLANT
DRAPE LAPAROTOMY 100X72X124 (DRAPES) ×3 IMPLANT
DRAPE SURG 17X23 STRL (DRAPES) ×12 IMPLANT
DRSG OPSITE POSTOP 4X6 (GAUZE/BANDAGES/DRESSINGS) ×3 IMPLANT
ELECT BLADE 4.0 EZ CLEAN MEGAD (MISCELLANEOUS) ×3
ELECT REM PT RETURN 9FT ADLT (ELECTROSURGICAL) ×3
ELECTRODE BLDE 4.0 EZ CLN MEGD (MISCELLANEOUS) ×1 IMPLANT
ELECTRODE REM PT RTRN 9FT ADLT (ELECTROSURGICAL) ×1 IMPLANT
EVACUATOR 1/8 PVC DRAIN (DRAIN) IMPLANT
GAUZE 4X4 16PLY RFD (DISPOSABLE) ×3 IMPLANT
GAUZE SPONGE 4X4 12PLY STRL (GAUZE/BANDAGES/DRESSINGS) ×3 IMPLANT
GLOVE BIO SURGEON STRL SZ8 (GLOVE) ×6 IMPLANT
GLOVE BIO SURGEON STRL SZ8.5 (GLOVE) ×6 IMPLANT
GLOVE EXAM NITRILE XL STR (GLOVE) IMPLANT
GOWN STRL REUS W/ TWL LRG LVL3 (GOWN DISPOSABLE) IMPLANT
GOWN STRL REUS W/ TWL XL LVL3 (GOWN DISPOSABLE) ×2 IMPLANT
GOWN STRL REUS W/TWL 2XL LVL3 (GOWN DISPOSABLE) IMPLANT
GOWN STRL REUS W/TWL LRG LVL3 (GOWN DISPOSABLE)
GOWN STRL REUS W/TWL XL LVL3 (GOWN DISPOSABLE) ×4
HEMOSTAT POWDER KIT SURGIFOAM (HEMOSTASIS) ×3 IMPLANT
KIT BASIN OR (CUSTOM PROCEDURE TRAY) ×3 IMPLANT
KIT TURNOVER KIT B (KITS) ×3 IMPLANT
MILL MEDIUM DISP (BLADE) ×3 IMPLANT
NEEDLE HYPO 21X1.5 SAFETY (NEEDLE) ×3 IMPLANT
NEEDLE HYPO 22GX1.5 SAFETY (NEEDLE) ×3 IMPLANT
NS IRRIG 1000ML POUR BTL (IV SOLUTION) ×3 IMPLANT
OIL CARTRIDGE MAESTRO DRILL (MISCELLANEOUS) ×3
PACK LAMINECTOMY NEURO (CUSTOM PROCEDURE TRAY) ×3 IMPLANT
PAD ARMBOARD 7.5X6 YLW CONV (MISCELLANEOUS) ×9 IMPLANT
PATTIES SURGICAL .5 X1 (DISPOSABLE) IMPLANT
PUTTY DBM 10CC CALC GRAN (Putty) ×3 IMPLANT
ROD CURVED TI 6.35X55 (Rod) ×6 IMPLANT
SCREW PA DLX CREO 7.5X45 (Screw) ×6 IMPLANT
SPONGE LAP 4X18 RFD (DISPOSABLE) IMPLANT
SPONGE NEURO XRAY DETECT 1X3 (DISPOSABLE) IMPLANT
SPONGE SURGIFOAM ABS GEL 100 (HEMOSTASIS) IMPLANT
STRIP CLOSURE SKIN 1/2X4 (GAUZE/BANDAGES/DRESSINGS) ×2 IMPLANT
SUT VIC AB 1 CT1 18XBRD ANBCTR (SUTURE) ×2 IMPLANT
SUT VIC AB 1 CT1 8-18 (SUTURE) ×4
SUT VIC AB 2-0 CP2 18 (SUTURE) ×6 IMPLANT
SYR 20ML LL LF (SYRINGE) IMPLANT
TOWEL GREEN STERILE (TOWEL DISPOSABLE) ×3 IMPLANT
TOWEL GREEN STERILE FF (TOWEL DISPOSABLE) ×3 IMPLANT
TRAY FOLEY MTR SLVR 16FR STAT (SET/KITS/TRAYS/PACK) ×3 IMPLANT
WATER STERILE IRR 1000ML POUR (IV SOLUTION) ×3 IMPLANT

## 2019-03-03 NOTE — H&P (Signed)
Subjective: The patient is a 69 year old white female on whom I previously performed an L4-5 decompression, instrumentation and fusion.  She did well for years but has developed recurrent back and leg pain consistent with neurogenic claudication.  She has failed medical management and was worked up with a lumbar MRI and x-rays which demonstrated L3-4 spinal stenosis.  I discussed the various treatment options.  She has decided to proceed with surgery.  Past Medical History:  Diagnosis Date  . Arthritis   . Complication of anesthesia    "many years ago, had problems waking up"; patient stated no problems with recent surgeries 01/21/2018  . GERD (gastroesophageal reflux disease)   . Hard of hearing   . History of bronchitis   . Hypercholesteremia   . Hypertriglyceridemia   . Hypothyroidism   . Macular degeneration of both eyes   . OA (osteoarthritis)    severe right knee  . Spondylolisthesis of lumbar region   . Wears glasses     Past Surgical History:  Procedure Laterality Date  . ABDOMINAL HYSTERECTOMY    . APPENDECTOMY    . BACK SURGERY     lumbar  . CATARACT EXTRACTION W/ INTRAOCULAR LENS IMPLANT Bilateral   . COLONOSCOPY    . ESOPHAGOGASTRODUODENOSCOPY    . JOINT REPLACEMENT    . KNEE ARTHROPLASTY Left   . TONSILLECTOMY    . TOTAL KNEE ARTHROPLASTY Right 06/12/2016   Procedure: TOTAL KNEE ARTHROPLASTY;  Surgeon: Vickey Huger, MD;  Location: [facility];  Service: Orthopedics;  Laterality: Right;  . TOTAL KNEE REVISION Left 06/14/2015   Procedure: TOTAL KNEE REVISION POLY EXCHANGE;  Surgeon: Vickey Huger, MD;  Location: [facility];  Service: Orthopedics;  Laterality: Left;    Allergies  Allergen Reactions  . Penicillins Hives and Itching    CHILDHOOD reaction to Pen G Has patient had a PCN reaction causing immediate rash, facial/tongue/throat swelling, SOB or lightheadedness with hypotension: No Has patient had a PCN reaction causing severe rash involving mucus membranes or skin  necrosis: No Has patient had a PCN reaction that required hospitalization: No Has patient had a PCN reaction occurring within the last 10 years: No If all of the above answers are "NO", then may proceed with Cephalosporin use.       Social History   Tobacco Use  . Smoking status: Former Research scientist (life sciences)  . Smokeless tobacco: Never Used  . Tobacco comment: "quit 10-15 years ago" 06/03/15  Substance Use Topics  . Alcohol use: Yes    Alcohol/week: 1.0 standard drinks    Types: 1 Glasses of wine per week    Comment: weekends    Family History  Problem Relation Age of Onset  . Heart disease Mother   . Heart disease Father    Prior to Admission medications   Medication Sig Start Date End Date Taking? Authorizing Provider  Ascorbic Acid (VITAMIN C) 1000 MG tablet Take 1,000 mg by mouth daily.   Yes [provider]  aspirin EC 81 MG tablet Take 81 mg by mouth 2 (two) times daily.    Yes [provider]  atorvastatin (LIPITOR) 10 MG tablet Take 10 mg by mouth daily. 01/20/19  Yes [provider]  Cholecalciferol (VITAMIN D) 2000 units CAPS Take 2,000 Units by mouth daily.   Yes [provider]  estradiol (ESTRACE) 2 MG tablet Take 2 mg by mouth daily.   Yes [provider]  Lutein 40 MG CAPS Take 40 mg by mouth 2 (two) times daily.  MORNING & AFTERNOON   Yes [provider]  MAGNESIUM GLYCINATE PO Take 400 mg by mouth at bedtime.   Yes [provider]  Melatonin 5 MG TABS Take 5 mg by mouth at bedtime.   Yes [provider]  niacin 500 MG tablet Take 500 mg by mouth 2 (two) times daily.    Yes [provider]  Omega-3 Fatty Acids (FISH OIL PO) Take 1,280 mg by mouth 2 (two) times daily.    Yes [provider]  omeprazole (PRILOSEC) 40 MG capsule Take 40 mg by mouth daily.   Yes [provider]  OVER THE COUNTER MEDICATION Take 1 capsule by mouth daily. DIM Enhanced Delivery System   Yes [provider]  progesterone (PROMETRIUM) 200 MG capsule Take 400 mg by mouth at bedtime. Compounded at [facility] in [place]. 05/18/16  Yes [provider]  Propylene Glycol (SYSTANE COMPLETE) 0.6 % SOLN Place 1 drop into both eyes daily.   Yes [provider]  Testosterone POWD Apply 1 application topically at bedtime. Testosterone Cream 0.25%   Yes [provider]  thyroid (ARMOUR) 60 MG tablet Take 60 mg by mouth daily before breakfast.   Yes [provider]  vitamin E 400 UNIT capsule Take 400 Units by mouth daily.   Yes [provider]     Review of Systems  Positive ROS: As above  All other systems have been reviewed and were otherwise negative with the exception of those mentioned in the HPI and as above.  Objective: Vital signs in last 24 hours: Temp:  [98.4 F (36.9 C)] 98.4 F (36.9 C) (01/04 0609) Pulse Rate:  [63] 63 (01/04 0609) BP: (135)/(72) 135/72 (01/04 0609) SpO2:  [98 %] 98 % (01/04 0609) Weight:  [62.1 kg] 62.1 kg (01/04 0609) Estimated body mass index is 26.76 kg/m as calculated from the following:   Height as of this encounter: 5' (1.524 m).   Weight as of this encounter: 62.1 kg.   General Appearance: Alert Head: Normocephalic, without obvious abnormality, atraumatic Eyes: PERRL, conjunctiva/corneas clear, EOM's intact,    Ears: Normal  Throat: Normal Back: Her lumbar incision is well-healed. Lungs: Clear to auscultation bilaterally, respirations unlabored Heart: Regular rate and rhythm, no murmur, rub or gallop Abdomen: Soft, non-tender Extremities: Extremities normal, atraumatic, no cyanosis or edema Skin: unremarkable  NEUROLOGIC:   Mental status: alert and oriented,Motor Exam - grossly normal Sensory Exam - grossly normal Reflexes:  Coordination - grossly normal Gait - grossly normal Balance - grossly normal Cranial Nerves: I: smell Not tested  II: visual acuity  OS: Normal  OD: Normal   II:  visual fields Full to confrontation  II: pupils Equal, round, reactive to light  III,VII: ptosis None  III,IV,VI: extraocular muscles  Full ROM  V: mastication Normal  V: facial light touch sensation  Normal  V,VII: corneal reflex  Present  VII: facial muscle function - upper  Normal  VII: facial muscle function - lower Normal  VIII: hearing Not tested  IX: soft palate elevation  Normal  IX,X: gag reflex Present  XI: trapezius strength  5/5  XI: sternocleidomastoid strength 5/5  XI: neck flexion strength  5/5  XII: tongue strength  Normal    Data Review Lab Results  Component Value Date   WBC 8.5 02/27/2019   HGB 17.4 (H) 02/27/2019   HCT 51.1 (H) 02/27/2019   MCV 91.4 02/27/2019   PLT 218 02/27/2019   Lab Results  Component Value Date   NA 136 01/31/2018   K 5.0 01/31/2018   CL 103 01/31/2018   CO2 21 (L) 01/31/2018   BUN 12 01/31/2018   CREATININE 0.99 01/31/2018   GLUCOSE 125 (H) 01/31/2018   Lab Results  Component Value Date   INR 1.03 06/03/2015    Assessment/Plan: L3-4 spinal stenosis, lumbago, lumbar radiculopathy, neurogenic claudication: I have discussed the situation with the patient.  I have reviewed her imaging studies with her and pointed out the abnormalities.  We have discussed the various treatment options including surgery.  I have described the surgical treatment option of an exploration of her lumbar fusion with an L3-4 decompression, instrumentation and fusion.  I have shown her surgical models.  I given her a surgical pamphlet.  We have discussed the risks, benefits, alternatives, expected postop course, and likelihood of achieving our goals with surgery.  I have answered all her questions.  She has decided to proceed with surgery.   Ophelia Charter 03/03/2019 7:23 AM

## 2019-03-03 NOTE — Anesthesia Preprocedure Evaluation (Signed)
Anesthesia Evaluation    Reviewed: Allergy & Precautions, Patient's Chart, lab work & pertinent test results  History of Anesthesia Complications (+) history of anesthetic complications  Airway Mallampati: II  TM Distance: >3 FB Neck ROM: Full    Dental no notable dental hx. (+) Teeth Intact, Caps   Pulmonary neg pulmonary ROS, former smoker,    Pulmonary exam normal breath sounds clear to auscultation       Cardiovascular negative cardio ROS Normal cardiovascular exam Rate:Normal     Neuro/Psych Macular Degeneration negative psych ROS   GI/Hepatic Neg liver ROS, GERD  Medicated and Controlled,  Endo/Other  Hypothyroidism Hyperlipidemia  Renal/GU negative Renal ROS     Musculoskeletal  (+) Arthritis , Osteoarthritis,  Spondylolisthesis lumbar spine   Abdominal   Peds  Hematology negative hematology ROS (+)   Anesthesia Other Findings Day of surgery medications reviewed with the patient.  Reproductive/Obstetrics                             Anesthesia Physical  Anesthesia Plan  ASA: II  Anesthesia Plan: General   Post-op Pain Management:    Induction: Intravenous  PONV Risk Score and Plan: 3 and Ondansetron, Dexamethasone, Treatment may vary due to age or medical condition and Midazolam  Airway Management Planned: Oral ETT  Additional Equipment:   Intra-op Plan:   Post-operative Plan: Extubation in OR  Informed Consent: I have reviewed the patients History and Physical, chart, labs and discussed the procedure including the risks, benefits and alternatives for the proposed anesthesia with the patient or authorized representative who has indicated his/her understanding and acceptance.     Dental advisory given  Plan Discussed with: CRNA and Surgeon  Anesthesia Plan Comments: (Lexiscan cardiolite 07/04/14 showed no reversible ischemia or infarction. Normal LV wall motion. LVEF  58%. Low-risk stress test findings.)        Anesthesia Quick Evaluation

## 2019-03-03 NOTE — Op Note (Signed)
Brief history: The patient is a 69 year old white female on whom I performed an L4-5 decompression, instrumentation and fusion years ago.  She initially did well but has developed recurrent back and leg pain consistent with neurogenic claudication.  She has failed medical management and was worked up with lumbar x-rays and lumbar MRI which demonstrated L3-4 spinal stenosis.  I discussed the various treatment options with her.  She has weighed the risks, benefits and alternatives and decided to proceed with surgery.  Preoperative diagnosis: L3-4 degenerative disc disease, spinal stenosis compressing both the L3 and the L4 nerve roots; lumbago; lumbar radiculopathy; neurogenic claudication  Postoperative diagnosis: The same and pseudoarthrosis  Procedure: Bilateral L3-4 laminotomy/foraminotomies/medial facetectomy to decompress the bilateral L3 and L4 nerve roots(the work required to do this was in addition to the work required to do the posterior lumbar interbody fusion because of the patient's spinal stenosis, facet arthropathy. Etc. requiring a wide decompression of the nerve roots.);  L3-4 transforaminal lumbar interbody fusion with local morselized autograft bone and Zimmer DBM; insertion of interbody prosthesis at L3-4 (globus peek expandable interbody prosthesis); posterior segmental instrumentation from L3 to L5 with globus titanium pedicle screws and rods; posterior lateral arthrodesis at L3-4 and L4-5 with local morselized autograft bone and Zimmer DBM; exploration of lumbar fusion/removal of lumbar hardware  Surgeon: Dr. Earle Gell  Asst.: Dr. Kristeen Miss  Anesthesia: Gen. endotracheal  Estimated blood loss: 200 cc  Drains: None  Complications: None  Description of procedure: The patient was brought to the operating room by the anesthesia team. General endotracheal anesthesia was induced. The patient was turned to the prone position on the Wilson frame. The patient's lumbosacral  region was then prepared with Betadine scrub and Betadine solution. Sterile drapes were applied.  I then injected the area to be incised with Marcaine with epinephrine solution. I then used the scalpel to make a linear midline incision over the L3-4 and L4-5 interspace, incising through the old surgical scar. I then used electrocautery to perform a bilateral subperiosteal dissection exposing the spinous process and lamina of L3, L4 and L5 and exposing the old hardware at L4-5.  We then inserted the Verstrac retractor to provide exposure.  We explored the old fusion by removing the caps from the pedicle screws at L4 and L5 and then removing the rod.  I inspected the posterior lateral arthrodesis.  It did not appear solid.   I began the decompression by using the high speed drill to perform laminotomies at L3-4 bilaterally. We then used the Kerrison punches to widen the laminotomy and removed the ligamentum flavum at L3-4 bilaterally. We used the Kerrison punches to remove the medial facets at L3-4 bilaterally. We performed wide foraminotomies about the bilateral L3 and L4 nerve roots completing the decompression.  We now turned our attention to the posterior lumbar interbody fusion. I used a scalpel to incise the intervertebral disc at L3-4 bilaterally. I then performed a partial intervertebral discectomy at L3-4 bilaterally using the pituitary forceps. We prepared the vertebral endplates at [id number] bilaterally for the fusion by removing the soft tissues with the curettes. We then used the trial spacers to pick the appropriate sized interbody prosthesis. We prefilled his prosthesis with a combination of local morselized autograft bone that we obtained during the decompression as well as Zimmer DBM. We inserted the prefilled prosthesis into the interspace at L3-4 from the right, we then turned and expanded the prosthesis. There was a good snug fit of the prosthesis in  the interspace. We then filled and the  remainder of the intervertebral disc space with local morselized autograft bone and Zimmer DBM. This completed the posterior lumbar interbody arthrodesis.  During the decompression and insertion of the prosthesis the assistant protected the thecal sac and nerve roots with the D'Errico retractor.  We now turned attention to the instrumentation. Under fluoroscopic guidance we cannulated the bilateral L3 pedicles with the bone probe. We then removed the bone probe. We then tapped the pedicle with a 6.5 millimeter tap. We then removed the tap. We probed inside the tapped pedicle with a ball probe to rule out cortical breaches. We then inserted a 7.5 x 45 millimeter pedicle screw into the L3 pedicles bilaterally under fluoroscopic guidance. We then palpated along the medial aspect of the pedicles to rule out cortical breaches. There were none. The nerve roots were not injured. We then connected the unilateral pedicle screws from L3-L5 with a lordotic rod. We compressed the construct and secured the rod in place with the caps. We then tightened the caps appropriately. This completed the instrumentation from L3-L5 bilaterally.  We now turned our attention to the posterior lateral arthrodesis at L3-4 and L4-5 bilaterally.  I cleared the soft tissue from the facets at L4-5 bilaterally.  We used the high-speed drill to decorticate the remainder of the facets, pars, transverse process at L3-4 and L4-5 bilaterally. We then applied a combination of local morselized autograft bone and Zimmer DBM over these decorticated posterior lateral structures. This completed the posterior lateral arthrodesis L3-4 and L4-5.  We then obtained hemostasis using bipolar electrocautery. We irrigated the wound out with bacitracin solution. We inspected the thecal sac and nerve roots and noted they were well decompressed. We then removed the retractor.  We injected Exparel . We reapproximated patient's thoracolumbar fascia with interrupted #1  Vicryl suture. We reapproximated patient's subcutaneous tissue with interrupted 2-0 Vicryl suture. The reapproximated patient's skin with Steri-Strips and benzoin. The wound was then coated with bacitracin ointment. A sterile dressing was applied. The drapes were removed. The patient was subsequently returned to the supine position where they were extubated by the anesthesia team. He was then transported to the post anesthesia care unit in stable condition. All sponge instrument and needle counts were reportedly correct at the end of this case.

## 2019-03-03 NOTE — Transfer of Care (Signed)
Immediate Anesthesia Transfer of Care Note  Patient: Molly Ferrell  Procedure(s) Performed: POSTERIOR LUMBAR INTERBODY FUSION LUMBAR THREE- LUMBAR FOUR; EXPLORE FUSION (N/A )  Patient Location: PACU  Anesthesia Type:General  Level of Consciousness: awake, alert , oriented, drowsy and patient cooperative  Airway & Oxygen Therapy: Patient Spontanous Breathing and Patient connected to nasal cannula oxygen  Post-op Assessment: Report given to RN and Post -op Vital signs reviewed and stable  Post vital signs: Reviewed and stable  Last Vitals:  Vitals Value Taken Time  BP 123/75 03/03/19 1054  Temp    Pulse 75 03/03/19 1056  Resp 14 03/03/19 1056  SpO2 96 % 03/03/19 1056  Vitals shown include unvalidated device data.  Last Pain:  Vitals:   03/03/19 0609  TempSrc: Oral  PainSc: 0-No pain      Patients Stated Pain Goal: 4 ([id number])  Complications: No apparent anesthesia complications

## 2019-03-03 NOTE — Progress Notes (Signed)
Pharmacy Antibiotic Note  Molly Ferrell is a 69 y.o. female admitted on 03/03/2019 for lumbar surgery. PCN allergy, Vancomycin 1gm IV given pre-op at ~7am.  Pharmacy has been consulted for Vancomycin dosing x 1 dose post-op for surgical prophylaxis.  No drain.  Plan: Vancomycin 500 mg IV x 1 at 8pm tonight. No follow-up needed. Pharmacy signing off. PTA Lutein order cancelled per herbal med policy.   Height: 5' (152.4 cm) Weight: 137 lb (62.1 kg) IBW/kg (Calculated) : 45.5  Temp (24hrs), Avg:98.2 F (36.8 C), Min:98 F (36.7 C), Max:98.4 F (36.9 C)  Recent Labs  Lab 02/27/19 1230 03/03/19 1216  WBC 8.5  --   CREATININE  --  0.98    Estimated Creatinine Clearance: 43.9 mL/min (by C-G formula based on SCr of 0.98 mg/dL).    Allergies  Allergen Reactions  . Penicillins Hives and Itching    CHILDHOOD reaction to Pen G Has patient had a PCN reaction causing immediate rash, facial/tongue/throat swelling, SOB or lightheadedness with hypotension: No Has patient had a PCN reaction causing severe rash involving mucus membranes or skin necrosis: No Has patient had a PCN reaction that required hospitalization: No Has patient had a PCN reaction occurring within the last 10 years: No If all of the above answers are "NO", then may proceed with Cephalosporin use.       Arty Baumgartner, [place] Phone: [phone] 03/03/2019 4:06 PM

## 2019-03-03 NOTE — Anesthesia Postprocedure Evaluation (Signed)
Anesthesia Post Note  Patient: Molly Ferrell  Procedure(s) Performed: POSTERIOR LUMBAR INTERBODY FUSION LUMBAR THREE- LUMBAR FOUR; EXPLORE FUSION (N/A )     Patient location during evaluation: PACU Anesthesia Type: General Level of consciousness: awake and alert Pain management: pain level controlled Vital Signs Assessment: post-procedure vital signs reviewed and stable Respiratory status: spontaneous breathing, nonlabored ventilation and respiratory function stable Cardiovascular status: blood pressure returned to baseline and stable Postop Assessment: no apparent nausea or vomiting Anesthetic complications: no    Last Vitals:  Vitals:   03/03/19 1126 03/03/19 1140  BP: (!) 99/55 115/69  Pulse:  72  Resp:  18  Temp:  36.7 C  SpO2:      Last Pain:  Vitals:   03/03/19 1140  TempSrc: Oral  PainSc:                  Lynda Rainwater

## 2019-03-03 NOTE — Care Management (Signed)
TOC consult acknowledged to assist with any HH/DME needs. Awaiting PT/OT eval for DCP recommendations and will continue to follow.  Midge Minium MSN, RN, NCM-BC, ACM-RN [phone]

## 2019-03-03 NOTE — Progress Notes (Signed)
   Providing Compassionate, Quality Care - Together   Subjective: Patient reports back pain.  Objective: Vital signs in last 24 hours: Temp:  [98 F (36.7 C)-98.4 F (36.9 C)] 98 F (36.7 C) (01/04 1055) Pulse Rate:  [63-85] 85 (01/04 1110) Resp:  [10-11] 10 (01/04 1110) BP: (105-135)/(66-75) 105/66 (01/04 1110) SpO2:  [93 %-98 %] 93 % (01/04 1110) Weight:  [62.1 kg] 62.1 kg (01/04 0609)  Intake/Output from previous day: No intake/output data recorded. Intake/Output this shift: Total I/O In: 700 [I.V.:700] Out: 600 [Urine:450; Blood:150]  Alert and oriented x 4 PERRLA CN II-XII grossly intact MAE, Strength and sensation intact Incision is covered with Honeycomb dressing and Steri Strips; Dressing is clean, dry, and intact   Lab Results: No results for input(s): WBC, HGB, HCT, PLT in the last 72 hours. BMET No results for input(s): NA, K, CL, CO2, GLUCOSE, BUN, CREATININE, CALCIUM in the last 72 hours.  Studies/Results: DG Lumbar Spine 2-3 Views  Result Date: 03/03/2019 CLINICAL DATA:  L3-4 PLIF. EXAM: LUMBAR SPINE - 2-3 VIEW; DG C-ARM 1-60 MIN COMPARISON:  Lumbar spine radiographs 01/07/19 at [facility] FINDINGS: 2 intraoperative images demonstrate new pedicle screws at L3 and a disc spacer at L3-4. Alignment is anatomic. IMPRESSION: Interval L3-4 fusion without radiographic evidence for complication. Electronically Signed   By: San Morelle M.D.   On: 03/03/2019 10:39   DG C-Arm 1-60 Min  Result Date: 03/03/2019 CLINICAL DATA:  L3-4 PLIF. EXAM: LUMBAR SPINE - 2-3 VIEW; DG C-ARM 1-60 MIN COMPARISON:  Lumbar spine radiographs 01/07/19 at [facility] FINDINGS: 2 intraoperative images demonstrate new pedicle screws at L3 and a disc spacer at L3-4. Alignment is anatomic. IMPRESSION: Interval L3-4 fusion without radiographic evidence for complication. Electronically Signed   By: San Morelle M.D.   On: 03/03/2019 10:39    Assessment/Plan: Patient is status post L3-4 PLIF  by Dr. Arnoldo Morale. She is experiencing expected post operative pain. Getting ready to receive pain medication from nursing staff.   LOS: 0 days    -Admit to [facility] for observation -Mobilize with therapies   Molly Gilmore, DNP, AGNP-C Nurse Practitioner  [facility] [name]. [address], [address], [place], [place] [address] P: [phone]    F: [phone]  03/03/2019, 11:17 AM

## 2019-03-03 NOTE — Anesthesia Procedure Notes (Signed)
Procedure Name: Intubation Date/Time: 03/03/2019 7:37 AM Performed by: Raenette Rover, CRNA Pre-anesthesia Checklist: Patient identified, Emergency Drugs available, Suction available and Patient being monitored Patient Re-evaluated:Patient Re-evaluated prior to induction Oxygen Delivery Method: Circle system utilized Preoxygenation: Pre-oxygenation with 100% oxygen Induction Type: IV induction Ventilation: Mask ventilation without difficulty Laryngoscope Size: Miller and 2 Grade View: Grade I Tube type: Oral Tube size: 7.0 mm Number of attempts: 1 Airway Equipment and Method: Stylet Placement Confirmation: ETT inserted through vocal cords under direct vision,  positive ETCO2 and breath sounds checked- equal and bilateral Secured at: 21 cm Tube secured with: Tape Dental Injury: Teeth and Oropharynx as per pre-operative assessment

## 2019-03-04 LAB — CBC
HCT: 40.3 % (ref 36.0–46.0)
Hemoglobin: 13.8 g/dL (ref 12.0–15.0)
MCH: 30.9 pg (ref 26.0–34.0)
MCHC: 34.2 g/dL (ref 30.0–36.0)
MCV: 90.4 fL (ref 80.0–100.0)
Platelets: 201 10*3/uL (ref 150–400)
RBC: 4.46 MIL/uL (ref 3.87–5.11)
RDW: 12.2 % (ref 11.5–15.5)
WBC: 16.7 10*3/uL — ABNORMAL HIGH (ref 4.0–10.5)
nRBC: 0 % (ref 0.0–0.2)

## 2019-03-04 LAB — BASIC METABOLIC PANEL
Anion gap: 9 (ref 5–15)
BUN: 11 mg/dL (ref 8–23)
CO2: 24 mmol/L (ref 22–32)
Calcium: 8.9 mg/dL (ref 8.9–10.3)
Chloride: 106 mmol/L (ref 98–111)
Creatinine, Ser: 0.96 mg/dL (ref 0.44–1.00)
GFR calc Af Amer: >60 mL/min (ref >60)
GFR calc non Af Amer: 60 mL/min — ABNORMAL LOW (ref >60)
Glucose, Bld: 115 mg/dL — ABNORMAL HIGH (ref 70–99)
Potassium: 4.2 mmol/L (ref 3.5–5.1)
Sodium: 139 mmol/L (ref 135–145)

## 2019-03-04 MED ORDER — OXYCODONE HCL 5 MG PO TABS: 5.0000 mg | ORAL_TABLET | ORAL | 0 refills | Status: DC | PRN

## 2019-03-04 MED ORDER — CYCLOBENZAPRINE HCL 10 MG PO TABS: 10.0000 mg | ORAL_TABLET | Freq: Three times a day (TID) | ORAL | 0 refills | Status: DC | PRN

## 2019-03-04 MED ORDER — DOCUSATE SODIUM 100 MG PO CAPS: 100.0000 mg | ORAL_CAPSULE | Freq: Two times a day (BID) | ORAL | 0 refills | Status: DC

## 2019-03-04 NOTE — Progress Notes (Signed)
OT Eval and Discharge:   This 69 y/o female presents with the below. PTA pt reports independence with ADL, iADL and functional mobility. Pt demonstrating room level mobility without AD, UB and LB ADL overall at supervision level. Educated pt re: back precautions, brace management, safety and compensatory techniques for completing ADL and functional transfers with pt verbalizing/return demonstrating understanding.  Pt requiring min cues for adhering to back precautions during functional tasks but with improved carryover noted. Pt reports plans to return home with spouse assist at time of discharge. Questions answered throughout with no further acute OT needs identified. Acute OT to sign off at this time, thank you for this referral.       03/04/19 0800  OT Visit Information  Last OT Received On 03/04/19  Assistance Needed +1  History of Present Illness Pt is a 69 y/o female now s/p PLIF L3-4. PMHx includes arthritis, macular degeneration of both eyes, hx of bil TKA, previous back surgery   Precautions  Precautions Back;Fall  Precaution Booklet Issued Yes (comment)  Precaution Comments issued and reviewed with pt  Restrictions  Weight Bearing Restrictions No  Home Living  Family/patient expects to be discharged to: Private residence  Living Arrangements Spouse/significant other  Available Help at Discharge Family  Type of [place] Access Level entry  [place] Two level  Alternate Level Stairs-Number of Steps 2-3 steps to living room area  [place] - single point;Walker - 2 wheels;BSC  Prior Function  Level of Independence Independent  Communication  Communication HOH  Pain Assessment  Pain Assessment Faces  Faces Pain Scale 2  Pain Location incisional  Pain Descriptors / Indicators Discomfort;Sore  Pain Intervention(s) Limited activity within patient's tolerance;Monitored during  session;Repositioned  Cognition  Arousal/Alertness Awake/alert  Behavior During Therapy WFL for tasks assessed/performed  Overall Cognitive Status Within Functional Limits for tasks assessed  Upper Extremity Assessment  Upper Extremity Assessment Overall WFL for tasks assessed  Lower Extremity Assessment  Lower Extremity Assessment Defer to PT evaluation  Cervical / Trunk Assessment  Cervical / Trunk Assessment Other exceptions  Cervical / Trunk Exceptions s/p surgery  ADL  Overall ADL's  Needs assistance/impaired  Eating/Feeding Independent;Sitting  Grooming Supervision/safety;Standing  Upper Body Bathing Modified independent;Sitting  Lower Body Bathing Supervison/ safety;Sit to/from stand  Upper Body Dressing  Supervision/safety;Sitting  Upper Body Dressing Details (indicate cue type and reason) donning bra, overhead shirt and lumbar brace  Lower Body Dressing Supervision/safety;Sit to/from stand  Lower Body Dressing Details (indicate cue type and reason) pt using figure 4 for completion of task, donning pants/underwear  Toilet Transfer Supervision/safety;Ambulation  Toilet Transfer Details (indicate cue type and reason) simulated via transfer to/from [place] and Hygiene Supervision/safety;Sit to/from Education officer, community Details (indicate cue type and reason) educated pt to use built in shower seat for bathing ADL task for increased safety and adherence to back precautions  Functional mobility during ADLs Supervision/safety  General ADL Comments pt requiring min cues for carryover of back precautions  Bed Mobility  Overal bed mobility Modified Independent  General bed mobility comments HOB flat, VCs for log roll technique however no assist required  Transfers  Overall transfer level Modified independent  Equipment used None  Balance  Overall balance assessment Needs assistance  Sitting-balance support Feet supported  Sitting balance-Leahy  Scale Good  Standing balance support During functional activity;No upper extremity supported  Standing balance-Leahy Scale Fair  OT - End of Session  Equipment Utilized During Treatment Back brace  Activity Tolerance Patient tolerated treatment well  Patient left in chair;with call bell/phone within reach  Nurse Communication Mobility status  OT Assessment  OT Recommendation/Assessment Patient does not need any further OT services  OT Visit Diagnosis Other abnormalities of gait and mobility (R26.89);Pain  Pain - part of body  (back)  OT Problem List Decreased activity tolerance;Decreased knowledge of precautions;Decreased strength;Decreased knowledge of use of DME or AE  AM-PAC OT "6 Clicks" Daily Activity Outcome Measure (Version 2)  Help from another person eating meals? 4  Help from another person taking care of personal grooming? 4  Help from another person toileting, which includes using toliet, bedpan, or urinal? 4  Help from another person bathing (including washing, rinsing, drying)? 3  Help from another person to put on and taking off regular upper body clothing? 4  Help from another person to put on and taking off regular lower body clothing? 4  6 Click Score 23  OT Recommendation  Follow Up Recommendations No OT follow up;Supervision/Assistance - 24 hour (24hr initially)  OT Equipment None recommended by OT  Acute Rehab OT Goals  Patient Stated Goal home today  OT Goal Formulation All assessment and education complete, DC therapy  OT Time Calculation  OT Start Time (ACUTE ONLY) 0753  OT Stop Time (ACUTE ONLY) [id number]  OT Time Calculation (min) 19 min  OT General Charges  $OT Visit 1 Visit  OT Evaluation  $OT Eval Low Complexity 1 Low    Lou Cal, OT [facility] Services Pager [phone] Office [phone]

## 2019-03-04 NOTE — Plan of Care (Signed)
Patient alert and oriented, mae's well, voiding adequate amount of urine, swallowing without difficulty, no c/o pain at time of discharge. Patient discharged home with family. Script and discharged instructions given to patient. Patient and family stated understanding of instructions given. Patient has an appointment with Dr. Jenkins   

## 2019-03-04 NOTE — Evaluation (Signed)
Physical Therapy Evaluation Patient Details Name: Molly RINIKER MRN: [id number] DOB: 03-Aug-1950 Today's Date: 03/04/2019   History of Present Illness  Pt is a 69 y/o female now s/p PLIF L3-4. PMHx includes arthritis, macular degeneration of both eyes, hx of bil TKA, previous back surgery   Clinical Impression  Pt admitted with above diagnosis. At the time of PT eval, pt was able to demonstrate transfers and ambulation with gross supervision to modified independence and no AD. Pt grossly guarded throughout session but no overt LOB noted. Pt was educated on precautions, brace application/wearing schedule, appropriate activity progression, and car transfer. Pt currently with functional limitations due to the deficits listed below (see PT Problem List). Pt will benefit from skilled PT to increase their independence and safety with mobility to allow discharge to the venue listed below.      Follow Up Recommendations No PT follow up;Supervision for mobility/OOB    Equipment Recommendations  None recommended by PT    Recommendations for Other Services       Precautions / Restrictions Precautions Precautions: Back;Fall Precaution Booklet Issued: Yes (comment) Precaution Comments: Handout in room from OT session - reviewed precautions during functional mobility.  Required Braces or Orthoses: Spinal Brace Spinal Brace: Lumbar corset;Applied in sitting position Restrictions Weight Bearing Restrictions: No      Mobility  Bed Mobility Overal bed mobility: Modified Independent             General bed mobility comments: HOB flat, rails lowered to simulate home environment. No assist required to return to supine.   Transfers Overall transfer level: Modified independent Equipment used: None             General transfer comment: Increased time but generally steady without assistance.   Ambulation/Gait Ambulation/Gait assistance: Supervision Gait Distance (Feet): 300  Feet Assistive device: None Gait Pattern/deviations: Step-through pattern;Decreased stride length;Trunk flexed;Shuffle Gait velocity: Decreased Gait velocity interpretation: <1.8 ft/sec, indicate of risk for recurrent falls General Gait Details: VC's for improved posture. Generally low floor clearance with shuffling gait pattern initially, improving with distance. No overt LOB noted.   Stairs Stairs: (Pt declined stair training)          Wheelchair Mobility    Modified Rankin (Stroke Patients Only)       Balance Overall balance assessment: Needs assistance Sitting-balance support: Feet supported;No upper extremity supported Sitting balance-Leahy Scale: Fair     Standing balance support: No upper extremity supported;During functional activity Standing balance-Leahy Scale: Fair                               Pertinent Vitals/Pain Pain Assessment: Faces Faces Pain Scale: Hurts a little bit Pain Location: incisional Pain Descriptors / Indicators: Operative site guarding;Discomfort Pain Intervention(s): Limited activity within patient's tolerance;Monitored during session;Repositioned    Home Living Family/patient expects to be discharged to:: Private residence Living Arrangements: Spouse/significant other Available Help at Discharge: Family Type of Home: House Home Access: Level entry     Home Layout: Two level Home Equipment: Cane - single point;Walker - 2 wheels;Bedside commode      Prior Function Level of Independence: Independent               Hand Dominance   Dominant Hand: Right    Extremity/Trunk Assessment   Upper Extremity Assessment Upper Extremity Assessment: Defer to OT evaluation    Lower Extremity Assessment Lower Extremity Assessment: Generalized weakness    Cervical /  Trunk Assessment Cervical / Trunk Assessment: Other exceptions Cervical / Trunk Exceptions: s/p surgery  Communication   Communication: HOH  Cognition  Arousal/Alertness: Awake/alert Behavior During Therapy: WFL for tasks assessed/performed Overall Cognitive Status: Within Functional Limits for tasks assessed                                        General Comments      Exercises     Assessment/Plan    PT Assessment Patient needs continued PT services  PT Problem List Decreased strength;Decreased activity tolerance;Decreased balance;Decreased mobility;Decreased knowledge of use of DME;Decreased safety awareness;Decreased knowledge of precautions;Pain       PT Treatment Interventions DME instruction;Gait training;Stair training;Functional mobility training;Therapeutic activities;Therapeutic exercise;Neuromuscular re-education;Patient/family education    PT Goals (Current goals can be found in the Care Plan section)  Acute Rehab PT Goals Patient Stated Goal: Home today, get back to exercising, stop shuffling PT Goal Formulation: With patient Time For Goal Achievement: 03/11/19 Potential to Achieve Goals: Good    Frequency Min 5X/week   Barriers to discharge        Co-evaluation               AM-PAC PT "6 Clicks" Mobility  Outcome Measure Help needed turning from your back to your side while in a flat bed without using bedrails?: None Help needed moving from lying on your back to sitting on the side of a flat bed without using bedrails?: None Help needed moving to and from a bed to a chair (including a wheelchair)?: A Little Help needed standing up from a chair using your arms (e.g., wheelchair or bedside chair)?: None Help needed to walk in hospital room?: A Little Help needed climbing 3-5 steps with a railing? : A Little 6 Click Score: 21    End of Session Equipment Utilized During Treatment: Back brace Activity Tolerance: Patient tolerated treatment well Patient left: in bed;with call bell/phone within reach Nurse Communication: Mobility status PT Visit Diagnosis: Unsteadiness on feet  (R26.81);Pain Pain - part of body: (back)    Time: [id number] PT Time Calculation (min) (ACUTE ONLY): 17 min   Charges:   PT Evaluation $PT Eval Moderate Complexity: 1 Mod          Rolinda Roan, PT, DPT Acute Rehabilitation Services Pager: [phone] Office: [phone]   Thelma Comp 03/04/2019, 8:57 AM

## 2019-03-04 NOTE — Discharge Summary (Signed)
Physician Discharge Summary  Patient ID: Molly Ferrell MRN: [id number] DOB/AGE: 08/10/1950 69 y.o.  Admit date: 03/03/2019 Discharge date: 03/04/2019  Admission Diagnoses: Lumbar spinal stenosis with neurogenic claudication, lumbar radiculopathy, lumbago  Discharge Diagnoses: The same Active Problems:   Spinal stenosis of lumbar region with neurogenic claudication   Discharged Condition: good  Hospital Course: I performed an exploration of the patient's lumbar fusion with an L3-4 decompression, instrumentation and fusion on 03/03/2019.  The surgery went well.  The patient's postoperative course was unremarkable.  On postoperative day #1 she requested discharge to home.  She was given written and oral discharge instructions.  All questions were answered.  Consults: PT, OT Significant Diagnostic Studies: None Treatments: Exploration of lumbar fusion, L3-4 decompression, instrumentation and fusion, redo L4-5 posterior lateral arthrodesis. Discharge Exam: Blood pressure 103/66, pulse 65, temperature 98.2 F (36.8 C), temperature source Oral, resp. rate 18, height 5' (1.524 m), weight 62.1 kg, SpO2 97 %. The patient is alert and pleasant.  She looks well.  Her strength is normal.  Her dressing clean and dry.  Disposition: Home  Discharge Instructions    Call MD for:  difficulty breathing, headache or visual disturbances   Complete by: As directed    Call MD for:  extreme fatigue   Complete by: As directed    Call MD for:  hives   Complete by: As directed    Call MD for:  persistant dizziness or light-headedness   Complete by: As directed    Call MD for:  persistant nausea and vomiting   Complete by: As directed    Call MD for:  redness, tenderness, or signs of infection (pain, swelling, redness, odor or green/yellow discharge around incision site)   Complete by: As directed    Call MD for:  severe uncontrolled pain   Complete by: As directed    Call MD for:  temperature >100.4    Complete by: As directed    Diet - low sodium heart healthy   Complete by: As directed    Discharge instructions   Complete by: As directed    Call [phone] for a followup appointment. Take a stool softener while you are using pain medications.   Driving Restrictions   Complete by: As directed    Do not drive for 2 weeks.   Increase activity slowly   Complete by: As directed    Lifting restrictions   Complete by: As directed    Do not lift more than 5 pounds. No excessive bending or twisting.   May shower / Bathe   Complete by: As directed    Remove the dressing for 3 days after surgery.  You may shower, but leave the incision alone.   Remove dressing in 48 hours   Complete by: As directed    Your stitches are under the scan and will dissolve by themselves. The Steri-Strips will fall off after you take a few showers. Do not rub back or pick at the wound, Leave the wound alone.     Allergies as of 03/04/2019      Reactions   Penicillins Hives, Itching   CHILDHOOD reaction to Pen G Has patient had a PCN reaction causing immediate rash, facial/tongue/throat swelling, SOB or lightheadedness with hypotension: No Has patient had a PCN reaction causing severe rash involving mucus membranes or skin necrosis: No Has patient had a PCN reaction that required hospitalization: No Has patient had a PCN reaction occurring within the last 10 years: No  If all of the above answers are "NO", then may proceed with Cephalosporin use.      Medication List    TAKE these medications   aspirin EC 81 MG tablet Take 81 mg by mouth 2 (two) times daily.   atorvastatin 10 MG tablet Commonly known as: LIPITOR Take 10 mg by mouth daily.   cyclobenzaprine 10 MG tablet Commonly known as: FLEXERIL Take 1 tablet (10 mg total) by mouth 3 (three) times daily as needed for muscle spasms.   docusate sodium 100 MG capsule Commonly known as: COLACE Take 1 capsule (100 mg total) by mouth 2 (two) times  daily.   estradiol 2 MG tablet Commonly known as: ESTRACE Take 2 mg by mouth daily.   FISH OIL PO Take 1,280 mg by mouth 2 (two) times daily.   Lutein 40 MG Caps Take 40 mg by mouth 2 (two) times daily. MORNING & AFTERNOON   MAGNESIUM GLYCINATE PO Take 400 mg by mouth at bedtime.   Melatonin 5 MG Tabs Take 5 mg by mouth at bedtime.   niacin 500 MG tablet Take 500 mg by mouth 2 (two) times daily.   omeprazole 40 MG capsule Commonly known as: PRILOSEC Take 40 mg by mouth daily.   OVER THE COUNTER MEDICATION Take 1 capsule by mouth daily. DIM Enhanced Delivery System   oxyCODONE 5 MG immediate release tablet Commonly known as: Oxy IR/ROXICODONE Take 1 tablet (5 mg total) by mouth every 4 (four) hours as needed for moderate pain ((score 4 to 6)).   progesterone 200 MG capsule Commonly known as: PROMETRIUM Take 400 mg by mouth at bedtime. Compounded at [facility] in [place].   Systane Complete 0.6 % Soln Generic drug: Propylene Glycol Place 1 drop into both eyes daily.   Testosterone Powd Apply 1 application topically at bedtime. Testosterone Cream 0.25%   thyroid 60 MG tablet Commonly known as: ARMOUR Take 60 mg by mouth daily before breakfast.   vitamin C 1000 MG tablet Take 1,000 mg by mouth daily.   Vitamin D 50 MCG (2000 UT) Caps Take 2,000 Units by mouth daily.   vitamin E 400 UNIT capsule Take 400 Units by mouth daily.        Signed: Ophelia Charter 03/04/2019, 7:32 AM

## 2019-03-07 MED FILL — Sodium Chloride IV Soln 0.9%: INTRAVENOUS | Qty: 1000 | Status: AC

## 2019-03-07 MED FILL — Heparin Sodium (Porcine) Inj 1000 Unit/ML: INTRAMUSCULAR | Qty: 30 | Status: AC

## 2019-03-11 DIAGNOSIS — Z961 Presence of intraocular lens: Secondary | ICD-10-CM | POA: Diagnosis not present

## 2019-03-11 DIAGNOSIS — H353123 Nonexudative age-related macular degeneration, left eye, advanced atrophic without subfoveal involvement: Secondary | ICD-10-CM | POA: Diagnosis not present

## 2019-03-11 DIAGNOSIS — H353112 Nonexudative age-related macular degeneration, right eye, intermediate dry stage: Secondary | ICD-10-CM | POA: Diagnosis not present

## 2019-03-11 DIAGNOSIS — H26493 Other secondary cataract, bilateral: Secondary | ICD-10-CM | POA: Diagnosis not present

## 2019-03-27 DIAGNOSIS — M4316 Spondylolisthesis, lumbar region: Secondary | ICD-10-CM | POA: Diagnosis not present

## 2019-04-15 DIAGNOSIS — M79642 Pain in left hand: Secondary | ICD-10-CM | POA: Diagnosis not present

## 2019-07-01 DIAGNOSIS — M4316 Spondylolisthesis, lumbar region: Secondary | ICD-10-CM | POA: Diagnosis not present

## 2019-07-01 DIAGNOSIS — M5136 Other intervertebral disc degeneration, lumbar region: Secondary | ICD-10-CM | POA: Diagnosis not present

## 2019-07-01 DIAGNOSIS — Z6826 Body mass index (BMI) 26.0-26.9, adult: Secondary | ICD-10-CM | POA: Diagnosis not present

## 2019-07-31 DIAGNOSIS — K580 Irritable bowel syndrome with diarrhea: Secondary | ICD-10-CM | POA: Diagnosis not present

## 2019-08-20 DIAGNOSIS — M2550 Pain in unspecified joint: Secondary | ICD-10-CM | POA: Diagnosis not present

## 2019-08-20 DIAGNOSIS — E782 Mixed hyperlipidemia: Secondary | ICD-10-CM | POA: Diagnosis not present

## 2019-08-20 DIAGNOSIS — R5381 Other malaise: Secondary | ICD-10-CM | POA: Diagnosis not present

## 2019-08-20 DIAGNOSIS — R5383 Other fatigue: Secondary | ICD-10-CM | POA: Diagnosis not present

## 2019-08-22 DIAGNOSIS — M25511 Pain in right shoulder: Secondary | ICD-10-CM | POA: Diagnosis not present

## 2019-08-22 DIAGNOSIS — M1611 Unilateral primary osteoarthritis, right hip: Secondary | ICD-10-CM | POA: Diagnosis not present

## 2019-08-22 DIAGNOSIS — M25551 Pain in right hip: Secondary | ICD-10-CM | POA: Diagnosis not present

## 2019-08-28 DIAGNOSIS — Z79899 Other long term (current) drug therapy: Secondary | ICD-10-CM | POA: Diagnosis not present

## 2019-08-28 DIAGNOSIS — Z791 Long term (current) use of non-steroidal anti-inflammatories (NSAID): Secondary | ICD-10-CM | POA: Diagnosis not present

## 2019-08-28 DIAGNOSIS — E039 Hypothyroidism, unspecified: Secondary | ICD-10-CM | POA: Diagnosis not present

## 2019-08-28 DIAGNOSIS — K219 Gastro-esophageal reflux disease without esophagitis: Secondary | ICD-10-CM | POA: Diagnosis not present

## 2019-08-28 DIAGNOSIS — Z7982 Long term (current) use of aspirin: Secondary | ICD-10-CM | POA: Diagnosis not present

## 2019-08-28 DIAGNOSIS — M1990 Unspecified osteoarthritis, unspecified site: Secondary | ICD-10-CM | POA: Diagnosis not present

## 2019-08-28 DIAGNOSIS — E7800 Pure hypercholesterolemia, unspecified: Secondary | ICD-10-CM | POA: Diagnosis not present

## 2019-08-28 DIAGNOSIS — Z79890 Hormone replacement therapy (postmenopausal): Secondary | ICD-10-CM | POA: Diagnosis not present

## 2019-08-28 DIAGNOSIS — D751 Secondary polycythemia: Secondary | ICD-10-CM | POA: Diagnosis not present

## 2019-09-11 DIAGNOSIS — M1611 Unilateral primary osteoarthritis, right hip: Secondary | ICD-10-CM | POA: Diagnosis not present

## 2019-09-19 DIAGNOSIS — Z961 Presence of intraocular lens: Secondary | ICD-10-CM | POA: Diagnosis not present

## 2019-09-19 DIAGNOSIS — H353112 Nonexudative age-related macular degeneration, right eye, intermediate dry stage: Secondary | ICD-10-CM | POA: Diagnosis not present

## 2019-09-19 DIAGNOSIS — H26493 Other secondary cataract, bilateral: Secondary | ICD-10-CM | POA: Diagnosis not present

## 2019-09-19 DIAGNOSIS — H353123 Nonexudative age-related macular degeneration, left eye, advanced atrophic without subfoveal involvement: Secondary | ICD-10-CM | POA: Diagnosis not present

## 2019-10-07 DIAGNOSIS — E2839 Other primary ovarian failure: Secondary | ICD-10-CM | POA: Diagnosis not present

## 2019-10-07 DIAGNOSIS — E782 Mixed hyperlipidemia: Secondary | ICD-10-CM | POA: Diagnosis not present

## 2019-10-07 DIAGNOSIS — E039 Hypothyroidism, unspecified: Secondary | ICD-10-CM | POA: Diagnosis not present

## 2019-10-07 DIAGNOSIS — E258 Other adrenogenital disorders: Secondary | ICD-10-CM | POA: Diagnosis not present

## 2019-10-07 DIAGNOSIS — E2740 Unspecified adrenocortical insufficiency: Secondary | ICD-10-CM | POA: Diagnosis not present

## 2019-10-07 DIAGNOSIS — E559 Vitamin D deficiency, unspecified: Secondary | ICD-10-CM | POA: Diagnosis not present

## 2019-10-07 DIAGNOSIS — R531 Weakness: Secondary | ICD-10-CM | POA: Diagnosis not present

## 2019-10-07 DIAGNOSIS — M6281 Muscle weakness (generalized): Secondary | ICD-10-CM | POA: Diagnosis not present

## 2019-10-21 DIAGNOSIS — M1611 Unilateral primary osteoarthritis, right hip: Secondary | ICD-10-CM | POA: Diagnosis not present

## 2019-10-21 DIAGNOSIS — K219 Gastro-esophageal reflux disease without esophagitis: Secondary | ICD-10-CM | POA: Diagnosis not present

## 2019-10-21 DIAGNOSIS — K589 Irritable bowel syndrome without diarrhea: Secondary | ICD-10-CM | POA: Diagnosis not present

## 2019-10-21 DIAGNOSIS — E039 Hypothyroidism, unspecified: Secondary | ICD-10-CM | POA: Diagnosis not present

## 2019-10-21 DIAGNOSIS — Z79899 Other long term (current) drug therapy: Secondary | ICD-10-CM | POA: Diagnosis not present

## 2019-10-21 DIAGNOSIS — Z01812 Encounter for preprocedural laboratory examination: Secondary | ICD-10-CM | POA: Diagnosis not present

## 2019-10-28 DIAGNOSIS — Z79899 Other long term (current) drug therapy: Secondary | ICD-10-CM | POA: Diagnosis not present

## 2019-10-28 DIAGNOSIS — I4510 Unspecified right bundle-branch block: Secondary | ICD-10-CM | POA: Diagnosis not present

## 2019-10-28 DIAGNOSIS — R5381 Other malaise: Secondary | ICD-10-CM | POA: Diagnosis not present

## 2019-10-28 DIAGNOSIS — M2550 Pain in unspecified joint: Secondary | ICD-10-CM | POA: Diagnosis not present

## 2019-10-28 DIAGNOSIS — Z01818 Encounter for other preprocedural examination: Secondary | ICD-10-CM | POA: Diagnosis not present

## 2019-10-28 DIAGNOSIS — K219 Gastro-esophageal reflux disease without esophagitis: Secondary | ICD-10-CM | POA: Diagnosis not present

## 2019-10-28 DIAGNOSIS — M1611 Unilateral primary osteoarthritis, right hip: Secondary | ICD-10-CM | POA: Diagnosis not present

## 2019-10-28 DIAGNOSIS — E782 Mixed hyperlipidemia: Secondary | ICD-10-CM | POA: Diagnosis not present

## 2019-10-28 DIAGNOSIS — R5383 Other fatigue: Secondary | ICD-10-CM | POA: Diagnosis not present

## 2019-10-28 DIAGNOSIS — E039 Hypothyroidism, unspecified: Secondary | ICD-10-CM | POA: Diagnosis not present

## 2019-10-28 DIAGNOSIS — D751 Secondary polycythemia: Secondary | ICD-10-CM | POA: Diagnosis not present

## 2019-10-29 DIAGNOSIS — I4510 Unspecified right bundle-branch block: Secondary | ICD-10-CM | POA: Diagnosis not present

## 2019-11-11 DIAGNOSIS — M1611 Unilateral primary osteoarthritis, right hip: Secondary | ICD-10-CM | POA: Diagnosis not present

## 2019-11-11 DIAGNOSIS — Z01818 Encounter for other preprocedural examination: Secondary | ICD-10-CM | POA: Diagnosis not present

## 2019-11-18 DIAGNOSIS — M1611 Unilateral primary osteoarthritis, right hip: Secondary | ICD-10-CM | POA: Diagnosis not present

## 2019-11-18 DIAGNOSIS — E039 Hypothyroidism, unspecified: Secondary | ICD-10-CM | POA: Diagnosis not present

## 2019-11-18 DIAGNOSIS — Z471 Aftercare following joint replacement surgery: Secondary | ICD-10-CM | POA: Diagnosis not present

## 2019-11-18 DIAGNOSIS — Z96641 Presence of right artificial hip joint: Secondary | ICD-10-CM | POA: Diagnosis not present

## 2019-11-20 DIAGNOSIS — Z471 Aftercare following joint replacement surgery: Secondary | ICD-10-CM | POA: Diagnosis not present

## 2019-11-20 DIAGNOSIS — Z79890 Hormone replacement therapy (postmenopausal): Secondary | ICD-10-CM | POA: Diagnosis not present

## 2019-11-20 DIAGNOSIS — Z96641 Presence of right artificial hip joint: Secondary | ICD-10-CM | POA: Diagnosis not present

## 2019-11-20 DIAGNOSIS — K222 Esophageal obstruction: Secondary | ICD-10-CM | POA: Diagnosis not present

## 2019-11-20 DIAGNOSIS — E785 Hyperlipidemia, unspecified: Secondary | ICD-10-CM | POA: Diagnosis not present

## 2019-11-20 DIAGNOSIS — E039 Hypothyroidism, unspecified: Secondary | ICD-10-CM | POA: Diagnosis not present

## 2019-11-20 DIAGNOSIS — M1990 Unspecified osteoarthritis, unspecified site: Secondary | ICD-10-CM | POA: Diagnosis not present

## 2019-11-20 DIAGNOSIS — I4510 Unspecified right bundle-branch block: Secondary | ICD-10-CM | POA: Diagnosis not present

## 2019-11-20 DIAGNOSIS — M8580 Other specified disorders of bone density and structure, unspecified site: Secondary | ICD-10-CM | POA: Diagnosis not present

## 2019-11-20 DIAGNOSIS — Z791 Long term (current) use of non-steroidal anti-inflammatories (NSAID): Secondary | ICD-10-CM | POA: Diagnosis not present

## 2019-11-20 DIAGNOSIS — Z90710 Acquired absence of both cervix and uterus: Secondary | ICD-10-CM | POA: Diagnosis not present

## 2019-11-20 DIAGNOSIS — K219 Gastro-esophageal reflux disease without esophagitis: Secondary | ICD-10-CM | POA: Diagnosis not present

## 2019-11-25 DIAGNOSIS — M25651 Stiffness of right hip, not elsewhere classified: Secondary | ICD-10-CM | POA: Diagnosis not present

## 2019-11-25 DIAGNOSIS — M25551 Pain in right hip: Secondary | ICD-10-CM | POA: Diagnosis not present

## 2019-11-25 DIAGNOSIS — R262 Difficulty in walking, not elsewhere classified: Secondary | ICD-10-CM | POA: Diagnosis not present

## 2019-11-28 DIAGNOSIS — R262 Difficulty in walking, not elsewhere classified: Secondary | ICD-10-CM | POA: Diagnosis not present

## 2019-11-28 DIAGNOSIS — M25551 Pain in right hip: Secondary | ICD-10-CM | POA: Diagnosis not present

## 2019-11-28 DIAGNOSIS — M25651 Stiffness of right hip, not elsewhere classified: Secondary | ICD-10-CM | POA: Diagnosis not present

## 2019-12-01 DIAGNOSIS — M25551 Pain in right hip: Secondary | ICD-10-CM | POA: Diagnosis not present

## 2019-12-01 DIAGNOSIS — R262 Difficulty in walking, not elsewhere classified: Secondary | ICD-10-CM | POA: Diagnosis not present

## 2019-12-01 DIAGNOSIS — M25651 Stiffness of right hip, not elsewhere classified: Secondary | ICD-10-CM | POA: Diagnosis not present

## 2019-12-03 DIAGNOSIS — M25551 Pain in right hip: Secondary | ICD-10-CM | POA: Diagnosis not present

## 2019-12-03 DIAGNOSIS — M25651 Stiffness of right hip, not elsewhere classified: Secondary | ICD-10-CM | POA: Diagnosis not present

## 2019-12-03 DIAGNOSIS — R262 Difficulty in walking, not elsewhere classified: Secondary | ICD-10-CM | POA: Diagnosis not present

## 2019-12-08 DIAGNOSIS — M25651 Stiffness of right hip, not elsewhere classified: Secondary | ICD-10-CM | POA: Diagnosis not present

## 2019-12-08 DIAGNOSIS — M25551 Pain in right hip: Secondary | ICD-10-CM | POA: Diagnosis not present

## 2019-12-08 DIAGNOSIS — R262 Difficulty in walking, not elsewhere classified: Secondary | ICD-10-CM | POA: Diagnosis not present

## 2019-12-10 DIAGNOSIS — M25551 Pain in right hip: Secondary | ICD-10-CM | POA: Diagnosis not present

## 2019-12-10 DIAGNOSIS — M25651 Stiffness of right hip, not elsewhere classified: Secondary | ICD-10-CM | POA: Diagnosis not present

## 2019-12-10 DIAGNOSIS — R262 Difficulty in walking, not elsewhere classified: Secondary | ICD-10-CM | POA: Diagnosis not present

## 2019-12-15 DIAGNOSIS — R262 Difficulty in walking, not elsewhere classified: Secondary | ICD-10-CM | POA: Diagnosis not present

## 2019-12-15 DIAGNOSIS — M25651 Stiffness of right hip, not elsewhere classified: Secondary | ICD-10-CM | POA: Diagnosis not present

## 2019-12-15 DIAGNOSIS — M25551 Pain in right hip: Secondary | ICD-10-CM | POA: Diagnosis not present

## 2019-12-17 DIAGNOSIS — M25551 Pain in right hip: Secondary | ICD-10-CM | POA: Diagnosis not present

## 2019-12-17 DIAGNOSIS — R262 Difficulty in walking, not elsewhere classified: Secondary | ICD-10-CM | POA: Diagnosis not present

## 2019-12-17 DIAGNOSIS — M25651 Stiffness of right hip, not elsewhere classified: Secondary | ICD-10-CM | POA: Diagnosis not present

## 2019-12-22 DIAGNOSIS — M25651 Stiffness of right hip, not elsewhere classified: Secondary | ICD-10-CM | POA: Diagnosis not present

## 2019-12-22 DIAGNOSIS — R262 Difficulty in walking, not elsewhere classified: Secondary | ICD-10-CM | POA: Diagnosis not present

## 2019-12-22 DIAGNOSIS — M25551 Pain in right hip: Secondary | ICD-10-CM | POA: Diagnosis not present

## 2019-12-24 DIAGNOSIS — R262 Difficulty in walking, not elsewhere classified: Secondary | ICD-10-CM | POA: Diagnosis not present

## 2019-12-24 DIAGNOSIS — M25551 Pain in right hip: Secondary | ICD-10-CM | POA: Diagnosis not present

## 2019-12-24 DIAGNOSIS — M25651 Stiffness of right hip, not elsewhere classified: Secondary | ICD-10-CM | POA: Diagnosis not present

## 2019-12-29 DIAGNOSIS — R262 Difficulty in walking, not elsewhere classified: Secondary | ICD-10-CM | POA: Diagnosis not present

## 2019-12-29 DIAGNOSIS — M25651 Stiffness of right hip, not elsewhere classified: Secondary | ICD-10-CM | POA: Diagnosis not present

## 2019-12-29 DIAGNOSIS — M25551 Pain in right hip: Secondary | ICD-10-CM | POA: Diagnosis not present

## 2019-12-30 DIAGNOSIS — M5136 Other intervertebral disc degeneration, lumbar region: Secondary | ICD-10-CM | POA: Diagnosis not present

## 2019-12-30 DIAGNOSIS — M4316 Spondylolisthesis, lumbar region: Secondary | ICD-10-CM | POA: Diagnosis not present

## 2019-12-31 DIAGNOSIS — M25651 Stiffness of right hip, not elsewhere classified: Secondary | ICD-10-CM | POA: Diagnosis not present

## 2019-12-31 DIAGNOSIS — R262 Difficulty in walking, not elsewhere classified: Secondary | ICD-10-CM | POA: Diagnosis not present

## 2019-12-31 DIAGNOSIS — M25551 Pain in right hip: Secondary | ICD-10-CM | POA: Diagnosis not present

## 2020-01-05 DIAGNOSIS — M25551 Pain in right hip: Secondary | ICD-10-CM | POA: Diagnosis not present

## 2020-01-05 DIAGNOSIS — M25651 Stiffness of right hip, not elsewhere classified: Secondary | ICD-10-CM | POA: Diagnosis not present

## 2020-01-05 DIAGNOSIS — R262 Difficulty in walking, not elsewhere classified: Secondary | ICD-10-CM | POA: Diagnosis not present

## 2020-01-07 DIAGNOSIS — M25651 Stiffness of right hip, not elsewhere classified: Secondary | ICD-10-CM | POA: Diagnosis not present

## 2020-01-07 DIAGNOSIS — R262 Difficulty in walking, not elsewhere classified: Secondary | ICD-10-CM | POA: Diagnosis not present

## 2020-01-07 DIAGNOSIS — M25551 Pain in right hip: Secondary | ICD-10-CM | POA: Diagnosis not present

## 2020-01-12 DIAGNOSIS — M1612 Unilateral primary osteoarthritis, left hip: Secondary | ICD-10-CM | POA: Diagnosis not present

## 2020-01-12 DIAGNOSIS — Z96641 Presence of right artificial hip joint: Secondary | ICD-10-CM | POA: Diagnosis not present

## 2020-01-12 DIAGNOSIS — Z471 Aftercare following joint replacement surgery: Secondary | ICD-10-CM | POA: Diagnosis not present

## 2020-01-13 DIAGNOSIS — M25551 Pain in right hip: Secondary | ICD-10-CM | POA: Diagnosis not present

## 2020-01-13 DIAGNOSIS — M25651 Stiffness of right hip, not elsewhere classified: Secondary | ICD-10-CM | POA: Diagnosis not present

## 2020-01-13 DIAGNOSIS — R262 Difficulty in walking, not elsewhere classified: Secondary | ICD-10-CM | POA: Diagnosis not present

## 2020-01-16 DIAGNOSIS — M25551 Pain in right hip: Secondary | ICD-10-CM | POA: Diagnosis not present

## 2020-01-16 DIAGNOSIS — R262 Difficulty in walking, not elsewhere classified: Secondary | ICD-10-CM | POA: Diagnosis not present

## 2020-01-16 DIAGNOSIS — M25651 Stiffness of right hip, not elsewhere classified: Secondary | ICD-10-CM | POA: Diagnosis not present

## 2020-01-20 DIAGNOSIS — M25551 Pain in right hip: Secondary | ICD-10-CM | POA: Diagnosis not present

## 2020-01-20 DIAGNOSIS — M25651 Stiffness of right hip, not elsewhere classified: Secondary | ICD-10-CM | POA: Diagnosis not present

## 2020-01-20 DIAGNOSIS — R262 Difficulty in walking, not elsewhere classified: Secondary | ICD-10-CM | POA: Diagnosis not present

## 2020-01-23 DIAGNOSIS — M25551 Pain in right hip: Secondary | ICD-10-CM | POA: Diagnosis not present

## 2020-01-23 DIAGNOSIS — R262 Difficulty in walking, not elsewhere classified: Secondary | ICD-10-CM | POA: Diagnosis not present

## 2020-01-23 DIAGNOSIS — M25651 Stiffness of right hip, not elsewhere classified: Secondary | ICD-10-CM | POA: Diagnosis not present

## 2020-01-26 DIAGNOSIS — M25551 Pain in right hip: Secondary | ICD-10-CM | POA: Diagnosis not present

## 2020-01-26 DIAGNOSIS — M25651 Stiffness of right hip, not elsewhere classified: Secondary | ICD-10-CM | POA: Diagnosis not present

## 2020-01-26 DIAGNOSIS — R262 Difficulty in walking, not elsewhere classified: Secondary | ICD-10-CM | POA: Diagnosis not present

## 2020-01-29 DIAGNOSIS — R262 Difficulty in walking, not elsewhere classified: Secondary | ICD-10-CM | POA: Diagnosis not present

## 2020-01-29 DIAGNOSIS — M25651 Stiffness of right hip, not elsewhere classified: Secondary | ICD-10-CM | POA: Diagnosis not present

## 2020-01-29 DIAGNOSIS — M25551 Pain in right hip: Secondary | ICD-10-CM | POA: Diagnosis not present

## 2020-02-02 DIAGNOSIS — M25651 Stiffness of right hip, not elsewhere classified: Secondary | ICD-10-CM | POA: Diagnosis not present

## 2020-02-02 DIAGNOSIS — M25551 Pain in right hip: Secondary | ICD-10-CM | POA: Diagnosis not present

## 2020-02-02 DIAGNOSIS — R262 Difficulty in walking, not elsewhere classified: Secondary | ICD-10-CM | POA: Diagnosis not present

## 2020-02-12 DIAGNOSIS — Z79899 Other long term (current) drug therapy: Secondary | ICD-10-CM | POA: Diagnosis not present

## 2020-02-12 DIAGNOSIS — M2550 Pain in unspecified joint: Secondary | ICD-10-CM | POA: Diagnosis not present

## 2020-02-12 DIAGNOSIS — R5381 Other malaise: Secondary | ICD-10-CM | POA: Diagnosis not present

## 2020-02-12 DIAGNOSIS — D72829 Elevated white blood cell count, unspecified: Secondary | ICD-10-CM | POA: Diagnosis not present

## 2020-02-12 DIAGNOSIS — E782 Mixed hyperlipidemia: Secondary | ICD-10-CM | POA: Diagnosis not present

## 2020-02-12 DIAGNOSIS — E039 Hypothyroidism, unspecified: Secondary | ICD-10-CM | POA: Diagnosis not present

## 2020-02-12 DIAGNOSIS — R5383 Other fatigue: Secondary | ICD-10-CM | POA: Diagnosis not present

## 2020-02-12 DIAGNOSIS — M1612 Unilateral primary osteoarthritis, left hip: Secondary | ICD-10-CM | POA: Diagnosis not present

## 2020-02-12 DIAGNOSIS — K219 Gastro-esophageal reflux disease without esophagitis: Secondary | ICD-10-CM | POA: Diagnosis not present

## 2020-02-12 DIAGNOSIS — I4510 Unspecified right bundle-branch block: Secondary | ICD-10-CM | POA: Diagnosis not present

## 2020-02-12 DIAGNOSIS — Z01818 Encounter for other preprocedural examination: Secondary | ICD-10-CM | POA: Diagnosis not present

## 2020-02-12 DIAGNOSIS — D751 Secondary polycythemia: Secondary | ICD-10-CM | POA: Diagnosis not present

## 2020-02-16 DIAGNOSIS — Z90710 Acquired absence of both cervix and uterus: Secondary | ICD-10-CM | POA: Diagnosis not present

## 2020-02-16 DIAGNOSIS — Z01419 Encounter for gynecological examination (general) (routine) without abnormal findings: Secondary | ICD-10-CM | POA: Diagnosis not present

## 2020-03-18 DIAGNOSIS — Z01812 Encounter for preprocedural laboratory examination: Secondary | ICD-10-CM | POA: Diagnosis not present

## 2020-03-18 DIAGNOSIS — M1612 Unilateral primary osteoarthritis, left hip: Secondary | ICD-10-CM | POA: Diagnosis not present

## 2020-03-26 DIAGNOSIS — Z1231 Encounter for screening mammogram for malignant neoplasm of breast: Secondary | ICD-10-CM | POA: Diagnosis not present

## 2020-03-31 DIAGNOSIS — M1612 Unilateral primary osteoarthritis, left hip: Secondary | ICD-10-CM | POA: Diagnosis not present

## 2020-03-31 DIAGNOSIS — K219 Gastro-esophageal reflux disease without esophagitis: Secondary | ICD-10-CM | POA: Diagnosis not present

## 2020-03-31 DIAGNOSIS — Z471 Aftercare following joint replacement surgery: Secondary | ICD-10-CM | POA: Diagnosis not present

## 2020-03-31 DIAGNOSIS — Z96642 Presence of left artificial hip joint: Secondary | ICD-10-CM | POA: Diagnosis not present

## 2020-04-01 DIAGNOSIS — R2681 Unsteadiness on feet: Secondary | ICD-10-CM | POA: Diagnosis not present

## 2020-04-01 DIAGNOSIS — R29898 Other symptoms and signs involving the musculoskeletal system: Secondary | ICD-10-CM | POA: Diagnosis not present

## 2020-04-05 DIAGNOSIS — M6281 Muscle weakness (generalized): Secondary | ICD-10-CM | POA: Diagnosis not present

## 2020-04-07 DIAGNOSIS — M6281 Muscle weakness (generalized): Secondary | ICD-10-CM | POA: Diagnosis not present

## 2020-04-12 DIAGNOSIS — M6281 Muscle weakness (generalized): Secondary | ICD-10-CM | POA: Diagnosis not present

## 2020-04-14 DIAGNOSIS — M6281 Muscle weakness (generalized): Secondary | ICD-10-CM | POA: Diagnosis not present

## 2020-04-19 DIAGNOSIS — M6281 Muscle weakness (generalized): Secondary | ICD-10-CM | POA: Diagnosis not present

## 2020-04-21 DIAGNOSIS — M6281 Muscle weakness (generalized): Secondary | ICD-10-CM | POA: Diagnosis not present

## 2020-04-26 DIAGNOSIS — M6281 Muscle weakness (generalized): Secondary | ICD-10-CM | POA: Diagnosis not present

## 2020-04-28 DIAGNOSIS — M6281 Muscle weakness (generalized): Secondary | ICD-10-CM | POA: Diagnosis not present

## 2020-05-03 DIAGNOSIS — M6281 Muscle weakness (generalized): Secondary | ICD-10-CM | POA: Diagnosis not present

## 2020-05-05 DIAGNOSIS — M6281 Muscle weakness (generalized): Secondary | ICD-10-CM | POA: Diagnosis not present

## 2020-05-08 IMAGING — CR DG LUMBAR SPINE 1V
1 series · 1 of 1 positions shown · non-contrast
Comparison: 08/03/2017, MRI from 07/12/2017

CLINICAL DATA: Intraoperative localization for lumbar fusion.

EXAM:
LUMBAR SPINE - 1 VIEW

[xtable lateral]
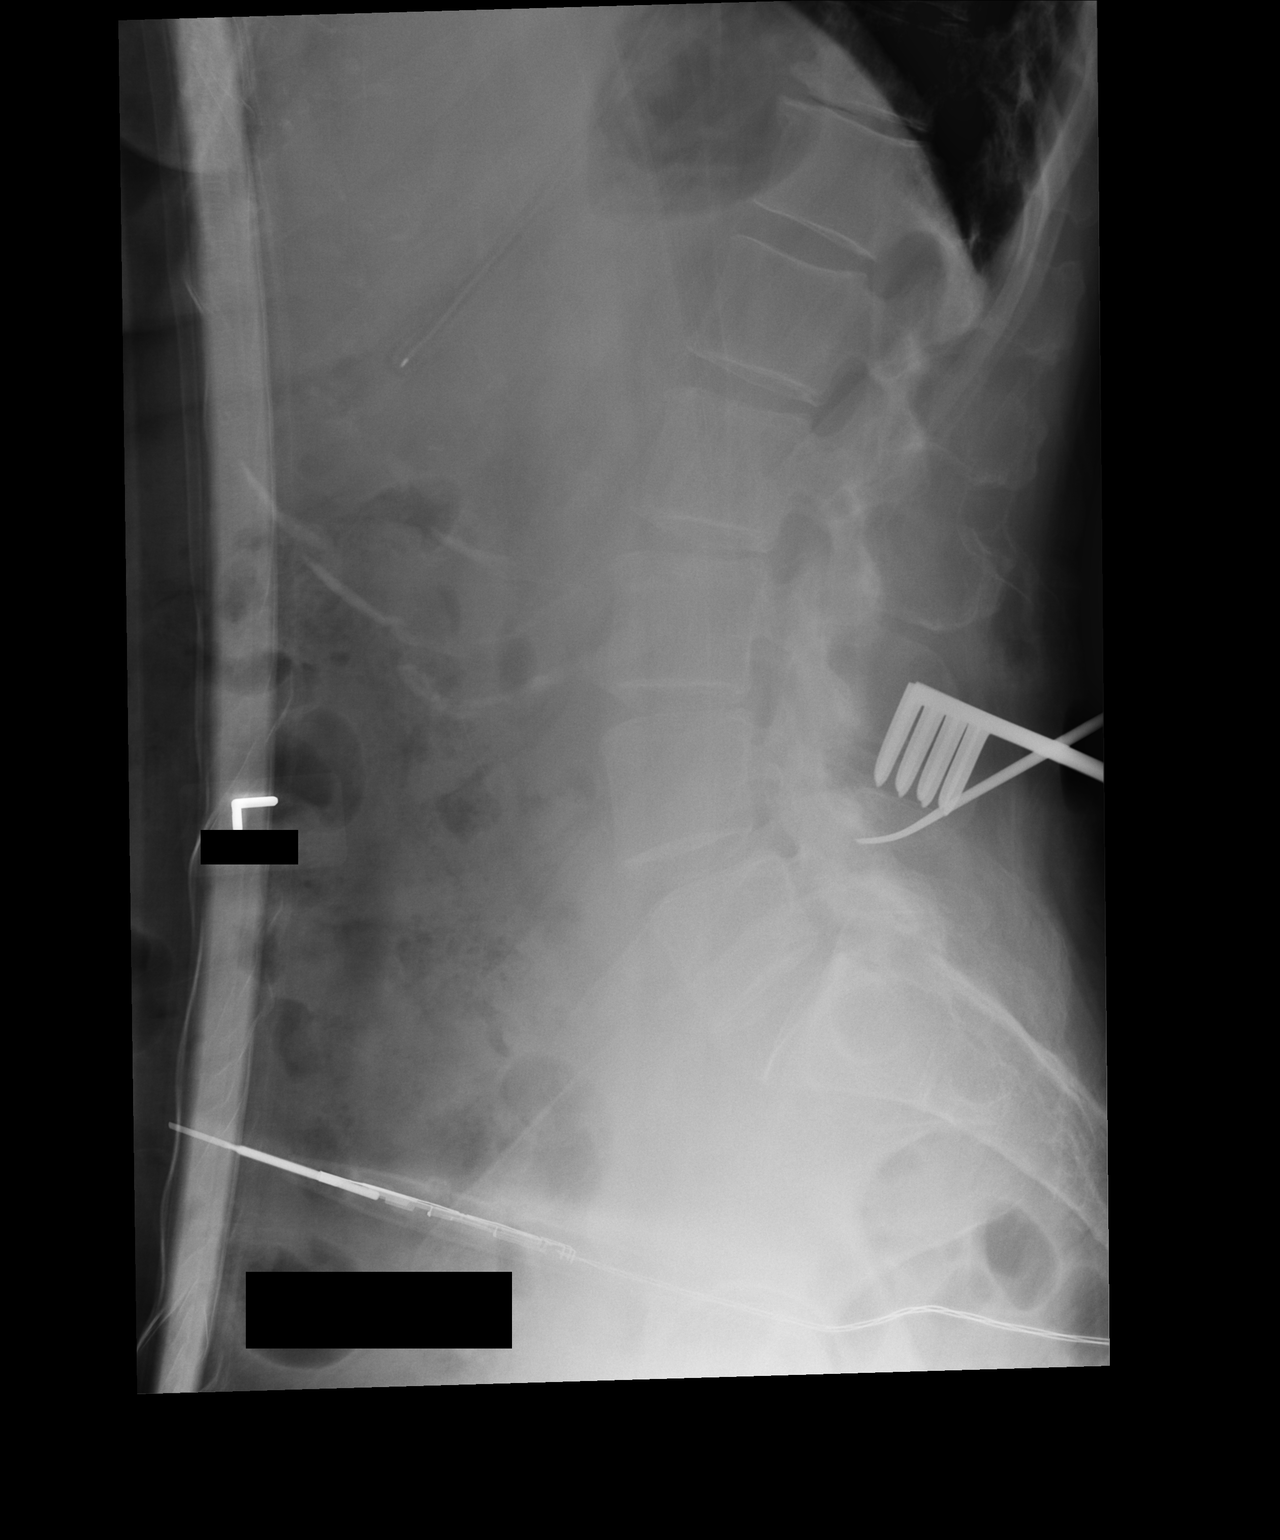

[1 of 1 positions shown; findings below may reference images not displayed]

FINDINGS: Surgical retractors and instruments are noted at the L4-5
interspace. Numbering nomenclature is similar to that utilized on
prior MRI.
IMPRESSION: Intraoperative localization at L4-5.

## 2020-05-10 DIAGNOSIS — M6281 Muscle weakness (generalized): Secondary | ICD-10-CM | POA: Diagnosis not present

## 2020-05-12 DIAGNOSIS — M6281 Muscle weakness (generalized): Secondary | ICD-10-CM | POA: Diagnosis not present

## 2020-05-13 DIAGNOSIS — Z96642 Presence of left artificial hip joint: Secondary | ICD-10-CM | POA: Diagnosis not present

## 2020-05-13 DIAGNOSIS — Z471 Aftercare following joint replacement surgery: Secondary | ICD-10-CM | POA: Diagnosis not present

## 2020-05-17 DIAGNOSIS — M6281 Muscle weakness (generalized): Secondary | ICD-10-CM | POA: Diagnosis not present

## 2020-05-19 DIAGNOSIS — M6281 Muscle weakness (generalized): Secondary | ICD-10-CM | POA: Diagnosis not present

## 2020-05-24 DIAGNOSIS — M6281 Muscle weakness (generalized): Secondary | ICD-10-CM | POA: Diagnosis not present

## 2020-06-21 DIAGNOSIS — H353112 Nonexudative age-related macular degeneration, right eye, intermediate dry stage: Secondary | ICD-10-CM | POA: Diagnosis not present

## 2020-06-21 DIAGNOSIS — H524 Presbyopia: Secondary | ICD-10-CM | POA: Diagnosis not present

## 2020-06-21 DIAGNOSIS — Z961 Presence of intraocular lens: Secondary | ICD-10-CM | POA: Diagnosis not present

## 2020-06-21 DIAGNOSIS — H353123 Nonexudative age-related macular degeneration, left eye, advanced atrophic without subfoveal involvement: Secondary | ICD-10-CM | POA: Diagnosis not present

## 2020-06-21 DIAGNOSIS — H26493 Other secondary cataract, bilateral: Secondary | ICD-10-CM | POA: Diagnosis not present

## 2020-06-29 DIAGNOSIS — Z6826 Body mass index (BMI) 26.0-26.9, adult: Secondary | ICD-10-CM | POA: Diagnosis not present

## 2020-06-29 DIAGNOSIS — M48062 Spinal stenosis, lumbar region with neurogenic claudication: Secondary | ICD-10-CM | POA: Diagnosis not present

## 2020-06-29 DIAGNOSIS — M5136 Other intervertebral disc degeneration, lumbar region: Secondary | ICD-10-CM | POA: Diagnosis not present

## 2020-06-29 DIAGNOSIS — R030 Elevated blood-pressure reading, without diagnosis of hypertension: Secondary | ICD-10-CM | POA: Diagnosis not present

## 2020-07-15 DIAGNOSIS — Z96642 Presence of left artificial hip joint: Secondary | ICD-10-CM | POA: Diagnosis not present

## 2020-07-15 DIAGNOSIS — Z471 Aftercare following joint replacement surgery: Secondary | ICD-10-CM | POA: Diagnosis not present

## 2020-07-20 DIAGNOSIS — M205X1 Other deformities of toe(s) (acquired), right foot: Secondary | ICD-10-CM | POA: Diagnosis not present

## 2020-07-20 DIAGNOSIS — M89371 Hypertrophy of bone, right ankle and foot: Secondary | ICD-10-CM | POA: Diagnosis not present

## 2020-08-20 DIAGNOSIS — B079 Viral wart, unspecified: Secondary | ICD-10-CM | POA: Diagnosis not present

## 2020-10-26 DIAGNOSIS — K219 Gastro-esophageal reflux disease without esophagitis: Secondary | ICD-10-CM | POA: Diagnosis not present

## 2020-10-26 DIAGNOSIS — Z0000 Encounter for general adult medical examination without abnormal findings: Secondary | ICD-10-CM | POA: Diagnosis not present

## 2020-10-26 DIAGNOSIS — N959 Unspecified menopausal and perimenopausal disorder: Secondary | ICD-10-CM | POA: Diagnosis not present

## 2020-10-26 DIAGNOSIS — E039 Hypothyroidism, unspecified: Secondary | ICD-10-CM | POA: Diagnosis not present

## 2020-10-26 DIAGNOSIS — R5383 Other fatigue: Secondary | ICD-10-CM | POA: Diagnosis not present

## 2020-10-26 DIAGNOSIS — R5381 Other malaise: Secondary | ICD-10-CM | POA: Diagnosis not present

## 2020-10-26 DIAGNOSIS — E782 Mixed hyperlipidemia: Secondary | ICD-10-CM | POA: Diagnosis not present

## 2020-10-26 DIAGNOSIS — M5416 Radiculopathy, lumbar region: Secondary | ICD-10-CM | POA: Diagnosis not present

## 2020-10-26 DIAGNOSIS — I4510 Unspecified right bundle-branch block: Secondary | ICD-10-CM | POA: Diagnosis not present

## 2020-11-16 DIAGNOSIS — M89371 Hypertrophy of bone, right ankle and foot: Secondary | ICD-10-CM | POA: Diagnosis not present

## 2020-11-16 DIAGNOSIS — M205X1 Other deformities of toe(s) (acquired), right foot: Secondary | ICD-10-CM | POA: Diagnosis not present

## 2020-11-23 DIAGNOSIS — F5222 Female sexual arousal disorder: Secondary | ICD-10-CM | POA: Diagnosis not present

## 2020-11-23 DIAGNOSIS — E2839 Other primary ovarian failure: Secondary | ICD-10-CM | POA: Diagnosis not present

## 2020-11-23 DIAGNOSIS — E039 Hypothyroidism, unspecified: Secondary | ICD-10-CM | POA: Diagnosis not present

## 2020-11-23 DIAGNOSIS — E559 Vitamin D deficiency, unspecified: Secondary | ICD-10-CM | POA: Diagnosis not present

## 2020-12-02 DIAGNOSIS — M89371 Hypertrophy of bone, right ankle and foot: Secondary | ICD-10-CM | POA: Diagnosis not present

## 2020-12-02 DIAGNOSIS — M205X1 Other deformities of toe(s) (acquired), right foot: Secondary | ICD-10-CM | POA: Diagnosis not present

## 2020-12-08 DIAGNOSIS — M89371 Hypertrophy of bone, right ankle and foot: Secondary | ICD-10-CM | POA: Diagnosis not present

## 2020-12-14 DIAGNOSIS — Z4889 Encounter for other specified surgical aftercare: Secondary | ICD-10-CM | POA: Diagnosis not present

## 2020-12-14 DIAGNOSIS — M89371 Hypertrophy of bone, right ankle and foot: Secondary | ICD-10-CM | POA: Diagnosis not present

## 2021-06-09 IMAGING — RF DG LUMBAR SPINE 2-3V
1 series · 2 of 2 positions shown · non-contrast
Comparison: Lumbar spine radiographs 01/07/19 at [REDACTED]

CLINICAL DATA: L3-4 PLIF.

EXAM:
LUMBAR SPINE - 2-3 VIEW; DG C-ARM 1-60 MIN

[Series 1: run · 2 of 2 slices shown]
[im 1/2]
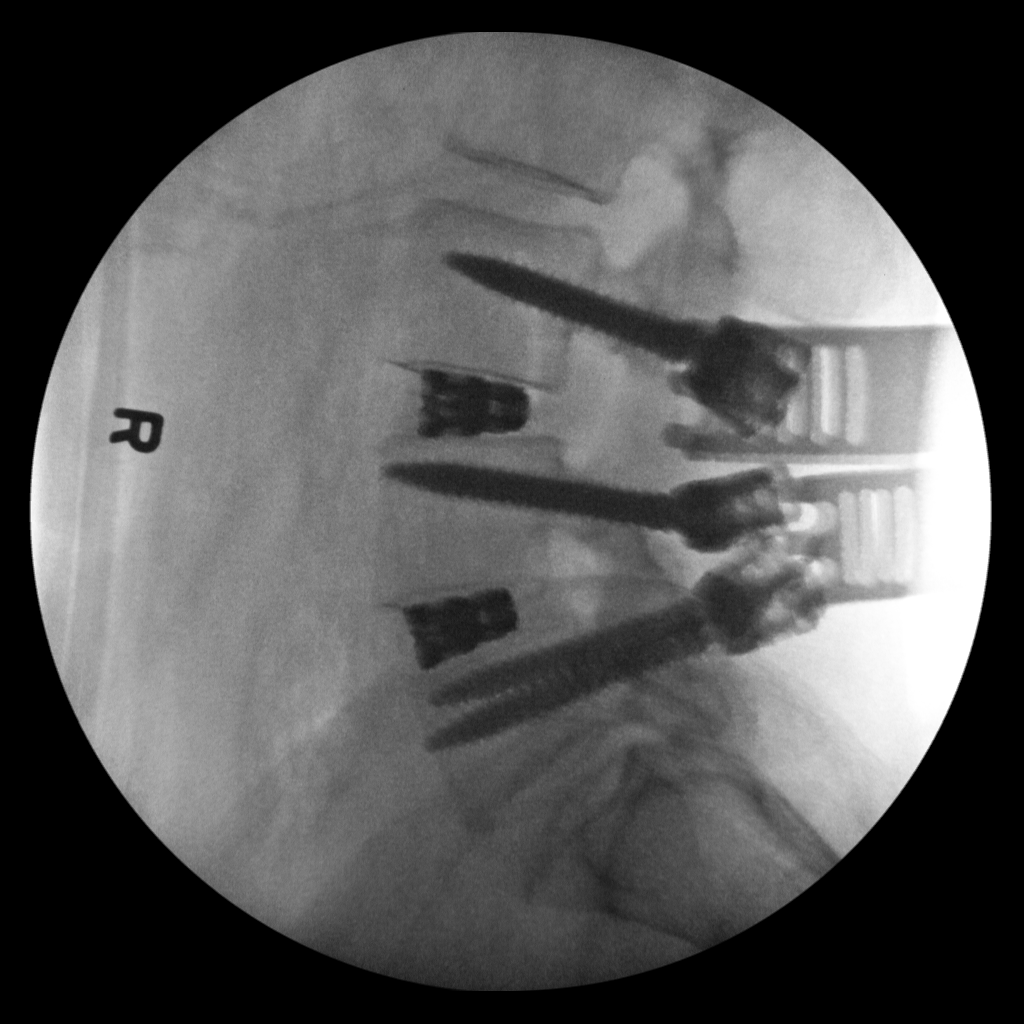
[im 2/2]
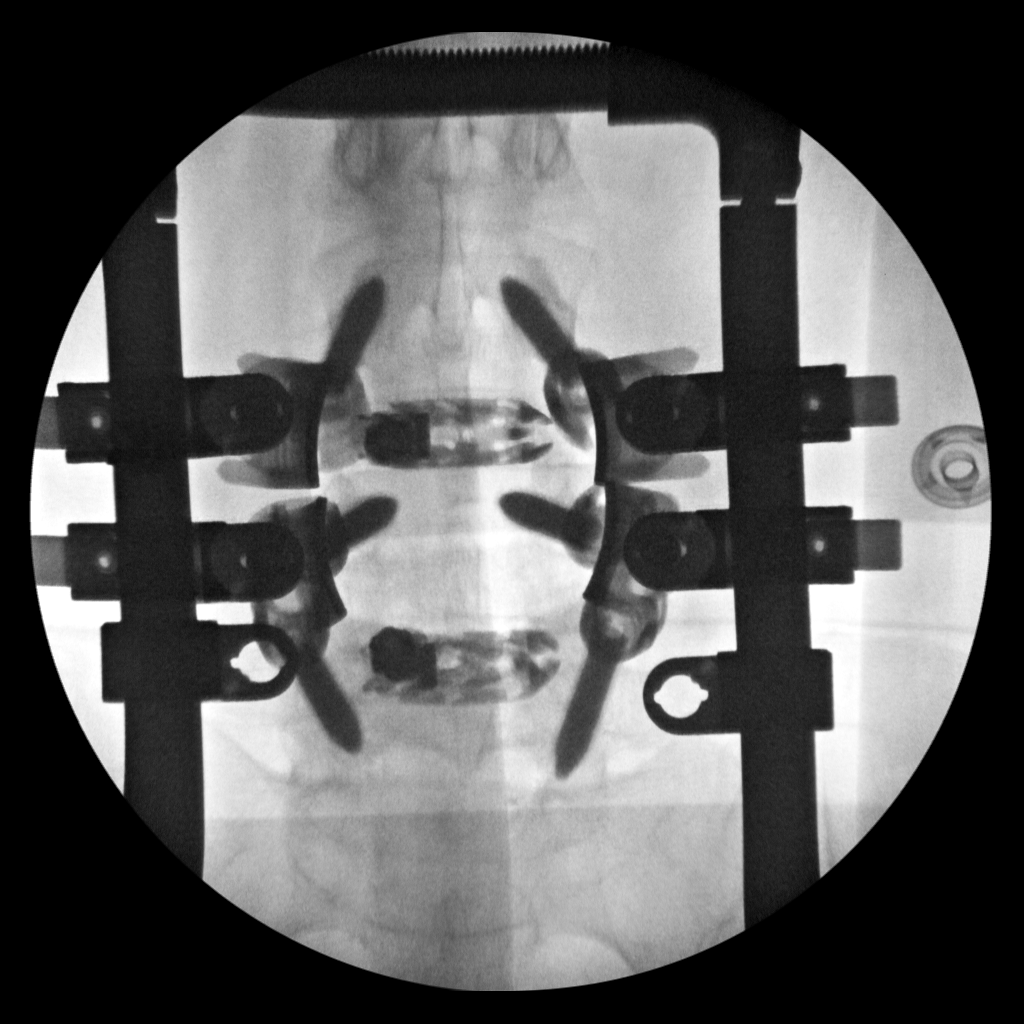

[2 of 2 positions shown; findings below may reference images not displayed]

FINDINGS: 2 intraoperative images demonstrate new pedicle screws at L3 and a
disc spacer at L3-4. Alignment is anatomic.
IMPRESSION: Interval L3-4 fusion without radiographic evidence for complication.

## 2021-07-28 DIAGNOSIS — C801 Malignant (primary) neoplasm, unspecified: Secondary | ICD-10-CM

## 2021-07-28 HISTORY — DX: Malignant (primary) neoplasm, unspecified: C80.1

## 2021-07-28 HISTORY — PX: BREAST LUMPECTOMY: SHX2

## 2021-08-09 DIAGNOSIS — D0511 Intraductal carcinoma in situ of right breast: Secondary | ICD-10-CM | POA: Insufficient documentation

## 2021-09-08 ENCOUNTER — Other Ambulatory Visit: Payer: Self-pay | Admitting: Oncology

## 2021-09-08 DIAGNOSIS — D0511 Intraductal carcinoma in situ of right breast: Secondary | ICD-10-CM

## 2021-09-09 ENCOUNTER — Inpatient Hospital Stay: Payer: PPO | Attending: Oncology | Admitting: Oncology

## 2021-09-09 ENCOUNTER — Other Ambulatory Visit: Payer: Self-pay | Admitting: Oncology

## 2021-09-09 ENCOUNTER — Inpatient Hospital Stay: Payer: PPO

## 2021-09-09 DIAGNOSIS — D0511 Intraductal carcinoma in situ of right breast: Secondary | ICD-10-CM

## 2021-09-09 LAB — HEPATIC FUNCTION PANEL
ALT: 27 U/L (ref 7–35)
AST: 27 (ref 13–35)
Alkaline Phosphatase: 48 (ref 25–125)
Bilirubin, Total: 0.5

## 2021-09-09 LAB — COMPREHENSIVE METABOLIC PANEL
Albumin: 4.7 (ref 3.5–5.0)
Calcium: 9.5 (ref 8.7–10.7)

## 2021-09-09 LAB — BASIC METABOLIC PANEL
BUN: 19 (ref 4–21)
CO2: 21 (ref 13–22)
Chloride: 107 (ref 99–108)
Creatinine: 0.8 (ref 0.5–1.1)
Glucose: 84
Potassium: 4 mEq/L (ref 3.5–5.1)
Sodium: 138 (ref 137–147)

## 2021-09-09 LAB — CBC AND DIFFERENTIAL
HCT: 49 — AB (ref 36–46)
Hemoglobin: 16.6 — AB (ref 12.0–16.0)
Neutrophils Absolute: 7
Platelets: 228 10*3/uL (ref 150–400)
WBC: 10

## 2021-09-09 LAB — CBC: RBC: 5.22 — AB (ref 3.87–5.11)

## 2021-09-09 NOTE — Progress Notes (Deleted)
Subjective:   By signing my name below, I, Molly Ferrell, attest that this documentation has been prepared under the direction and in the presence of Derwood Kaplan, MD    Clinic Date: 09/09/2021  Medical oncology consultation   Patient ID: Molly Ferrell, female    DOB: [birth year], 71 y.o.   MRN: [id number]  Chief complaint: Breast cancer   HPI  [name] is here today to discuss her breast cancer that was found on a screening mammogram by calcifications of the right breast. She regularly gets them every year. She went on to have a diagnostic mammogram, which showed suspicious calcifications in a linear distribution spanning 2.5 cm in the medial right breast. She  had a biopsy on Jul 08, 2021 which revealed DCIS. On August 08, 2021, she had lumpectomy by Dr.Moorhead, and he went back for further excisions to be sure of clear margins.She is healing well, and just feels sore. She has seen Dr.Palermo and has been scheduled for radiation. The final pathology revealed an 8 mm grade 3 ductal carcinoma in situ with micro-calcifications and necrosis.  All margins were clear. Estrogen receptors were positive at 80% and progesterone were also positive at 80%.  The patient is still on hormone replacement therapy with estradiol 2 mg daily, progesterone at night and testosterone cream. She was started on this since her hysterectomy at age 52. She had her first period at 71 years old. She had a hysterectomy at 71 years old for endometriosis but she has half an ovary remaining. She is nulliparous.   INTERVAL HISTORY Molly Ferrell is here today to follow up discussion her DCIS. Her blood counts are slightly elevated with a hemoglobin of 16.6 and hematocrit 48.5, likely from her testosterone supplement . Her chemistries are normal.  I feel we need to wean her off the hormone replacement therapy and she will likely have difficulty with this.  She will stop taking there progesterone, she will take a  half pill on Estrace for two weeks and then stop. We briefly discussed mesopausal symptoms and what to expect and we discussed the concept of chemoprevention.  I am not sure she will be willing to do that since she will have a difficulty just coming off hormone replacement therapy.  We can discuss this further when she returns after radiation.  She was advised to get a bone density scan. Her last bone density was many years ago. I also recommend radiation and that is scheduled to start next week.     Past Medical History:  Diagnosis Date   Arthritis    Complication of anesthesia    "many years ago, had problems waking up"; patient stated no problems with recent surgeries 01/21/2018   GERD (gastroesophageal reflux disease)    Hard of hearing    History of bronchitis    Hypercholesteremia    Hypertriglyceridemia    Hypothyroidism    Macular degeneration of both eyes    OA (osteoarthritis)    severe right knee   Spondylolisthesis of lumbar region    Wears glasses     Past Surgical History:  Procedure Laterality Date   ABDOMINAL HYSTERECTOMY     APPENDECTOMY     BACK SURGERY     lumbar   CATARACT EXTRACTION W/ INTRAOCULAR LENS IMPLANT Bilateral    COLONOSCOPY     ESOPHAGOGASTRODUODENOSCOPY     JOINT REPLACEMENT     KNEE ARTHROPLASTY Left    TONSILLECTOMY     TOTAL KNEE ARTHROPLASTY Right  06/12/2016   Procedure: TOTAL KNEE ARTHROPLASTY;  Surgeon: Vickey Huger, MD;  Location: [place];  Service: Orthopedics;  Laterality: Right;   TOTAL KNEE REVISION Left 06/14/2015   Procedure: TOTAL KNEE REVISION POLY EXCHANGE;  Surgeon: Vickey Huger, MD;  Location: [place];  Service: Orthopedics;  Laterality: Left;  Has had surgery L3 and L4 on back  Family History  Problem Relation Age of Onset   Heart disease Mother    Heart disease Father   Fathers side (all sisters) had breast cancer   SOCIAL HISTORY She Is married of 34 years She is a former smoker but quit 15 years ago She drinks alcohol  occasionally (weekend)  Social History   Socioeconomic History   Marital status: Married    Spouse name: Not on file   Number of children: Not on file   Years of education: Not on file   Highest education level: Not on file  Occupational History   Not on file  Tobacco Use   Smoking status: Former   Smokeless tobacco: Never   Tobacco comments:    "quit 10-15 years ago" 06/03/15  Vaping Use   Vaping Use: Never used  Substance and Sexual Activity   Alcohol use: Yes    Alcohol/week: 1.0 standard drink of alcohol    Types: 1 Glasses of wine per week    Comment: weekends   Drug use: No   Sexual activity: Not on file  Other Topics Concern   Not on file  Social History Narrative   Not on file   Social Determinants of Health   Financial Resource Strain: Not on file  Food Insecurity: Not on file  Transportation Needs: Not on file  Physical Activity: Not on file  Stress: Not on file  Social Connections: Not on file  Intimate Partner Violence: Not on file    Outpatient Medications Prior to Visit  Medication Sig Dispense Refill   Ascorbic Acid (VITAMIN C) 1000 MG tablet Take 1,000 mg by mouth daily.     aspirin EC 81 MG tablet Take 81 mg by mouth 2 (two) times daily.      atorvastatin (LIPITOR) 10 MG tablet Take 10 mg by mouth daily.     Cholecalciferol (VITAMIN D) 2000 units CAPS Take 2,000 Units by mouth daily.     cyclobenzaprine (FLEXERIL) 10 MG tablet Take 1 tablet (10 mg total) by mouth 3 (three) times daily as needed for muscle spasms. 50 tablet 0   docusate sodium (COLACE) 100 MG capsule Take 1 capsule (100 mg total) by mouth 2 (two) times daily. 60 capsule 0   estradiol (ESTRACE) 2 MG tablet Take 2 mg by mouth daily.     Lutein 40 MG CAPS Take 40 mg by mouth 2 (two) times daily. MORNING & AFTERNOON     MAGNESIUM GLYCINATE PO Take 400 mg by mouth at bedtime.     Melatonin 5 MG TABS Take 5 mg by mouth at bedtime.     niacin 500 MG tablet Take 500 mg by mouth 2 (two)  times daily.      Omega-3 Fatty Acids (FISH OIL PO) Take 1,280 mg by mouth 2 (two) times daily.      omeprazole (PRILOSEC) 40 MG capsule Take 40 mg by mouth daily.     OVER THE COUNTER MEDICATION Take 1 capsule by mouth daily. DIM Enhanced Delivery System     oxyCODONE (OXY IR/ROXICODONE) 5 MG immediate release tablet Take 1 tablet (5 mg total) by mouth  every 4 (four) hours as needed for moderate pain ((score 4 to 6)). 30 tablet 0   progesterone (PROMETRIUM) 200 MG capsule Take 400 mg by mouth at bedtime. Compounded at [facility] in [place].  1   Propylene Glycol (SYSTANE COMPLETE) 0.6 % SOLN Place 1 drop into both eyes daily.     Testosterone POWD Apply 1 application topically at bedtime. Testosterone Cream 0.25%     thyroid (ARMOUR) 60 MG tablet Take 60 mg by mouth daily before breakfast.     vitamin E 400 UNIT capsule Take 400 Units by mouth daily.     No facility-administered medications prior to visit.    Allergies  Allergen Reactions   Penicillins Hives and Itching    CHILDHOOD reaction to Pen G Has patient had a PCN reaction causing immediate rash, facial/tongue/throat swelling, SOB or lightheadedness with hypotension: No Has patient had a PCN reaction causing severe rash involving mucus membranes or skin necrosis: No Has patient had a PCN reaction that required hospitalization: No Has patient had a PCN reaction occurring within the last 10 years: No If all of the above answers are "NO", then may proceed with Cephalosporin use.       Review of Systems  Constitutional: Negative.   HENT: Negative.    Eyes: Negative.   Respiratory: Negative.    Cardiovascular: Negative.   Gastrointestinal: Negative.   Genitourinary: Negative.   Musculoskeletal: Negative.   Skin: Negative.   Neurological: Negative.   Endo/Heme/Allergies: Negative.   Psychiatric/Behavioral: Negative.         Objective:    Physical Exam Constitutional:      Appearance: Normal appearance. She is not  ill-appearing or diaphoretic.  HENT:     Head: Normocephalic and atraumatic.     Right Ear: External ear normal.     Left Ear: External ear normal.     Nose: No congestion or rhinorrhea.  Eyes:     Extraocular Movements: Extraocular movements intact.     Pupils: Pupils are equal, round, and reactive to light.  Cardiovascular:     Rate and Rhythm: Normal rate and regular rhythm.     Pulses: Normal pulses.     Heart sounds: Normal heart sounds. No murmur heard.    No gallop.  Pulmonary:     Effort: Pulmonary effort is normal. No respiratory distress.     Breath sounds: Rales present. No wheezing.     Comments: Few faint rales at base Chest:     Chest wall: No mass.  Breasts:    Right: No mass.     Left: No mass.     Comments: Well healed scar in superior areolar complex on the right   Musculoskeletal:     Cervical back: No tenderness.  Skin:    General: Skin is warm and dry.  Neurological:     Mental Status: She is alert and oriented to person, place, and time.  Psychiatric:        Judgment: Judgment normal.     There were no vitals taken for this visit. Wt Readings from Last 3 Encounters:  03/03/19 137 lb (62.1 kg)  02/27/19 138 lb 14.4 oz (63 kg)  01/21/18 133 lb 4.8 oz (60.5 kg)    Diabetic Foot Exam - Simple   No data filed    Lab Results  Component Value Date   WBC 16.7 (H) 03/04/2019   HGB 13.8 03/04/2019   HCT 40.3 03/04/2019   PLT 201 03/04/2019   GLUCOSE  115 (H) 03/04/2019   ALT 35 06/01/2016   AST 25 06/01/2016   NA 139 03/04/2019   K 4.2 03/04/2019   CL 106 03/04/2019   CREATININE 0.96 03/04/2019   BUN 11 03/04/2019   CO2 24 03/04/2019   INR 1.03 06/03/2015    No results found for: "TSH" Lab Results  Component Value Date   WBC 16.7 (H) 03/04/2019   HGB 13.8 03/04/2019   HCT 40.3 03/04/2019   MCV 90.4 03/04/2019   PLT 201 03/04/2019   Lab Results  Component Value Date   NA 139 03/04/2019   K 4.2 03/04/2019   CO2 24 03/04/2019    GLUCOSE 115 (H) 03/04/2019   BUN 11 03/04/2019   CREATININE 0.96 03/04/2019   BILITOT 0.7 06/01/2016   ALKPHOS 53 06/01/2016   AST 25 06/01/2016   ALT 35 06/01/2016   PROT 7.1 06/01/2016   ALBUMIN 3.9 06/01/2016   CALCIUM 8.9 03/04/2019   ANIONGAP 9 03/04/2019   No results found for: "CHOL" No results found for: "HDL" No results found for: "LDLCALC" No results found for: "TRIG" No results found for: "CHOLHDL" No results found for: "HGBA1C"     Assessment & Plan:   Ductal carcinoma in situ right breast She has had resection and will be starting radiotherapy soon.  I reviewed the diagnosis and pathology report. I emphasized her excellent prognosis. However I am very concerned about her still being on hormone replacement therapy for nearly 40 years. She seems surprised she needs to stop this but I tried to explain the importance of eliminating the progesterone and estrogen, it might help her to go to half dose on the estrogen for 2 weeks before she stops it. We discussed the expected menopausal symptoms. I also discussed the concept of chemoprevention but I am not sure she will be willing to take a hormone blocker.    I will schedule her for a base line bone density scan and I expect it to be good, but we will plan to repeat it in 2 years. She will proceed with radiation therapy, I will plan to see her back for follow up in 3 months for reexamination. At that time we will revisit the discussion on chemoprevention. Her questions were answered and she understand and agrees with this plan of care.  She knows to contact our office if she develops concerns prior to her next appointment.   Problem List Items Addressed This Visit       Other   Ductal carcinoma in situ (DCIS) of right breast - Primary (Chronic)  (Remove chronic)     No orders of the defined types were placed in this encounter.   I, Molly Ferrell, personally preformed the services described in this  documentation.  All medical record entries made by the scribe were at my direction and in my presence.  I have reviewed the chart and discharge instructions (if applicable) and agree that the record reflects my personal performance and is accurate and complete.      Molly Ferrell

## 2021-09-12 ENCOUNTER — Telehealth: Payer: Self-pay

## 2021-09-12 NOTE — Telephone Encounter (Signed)
I notified pt that Dr Hinton Rao plans to discuss the Tamoxifen with her @ next appt. Pt verbalized understanding.   Dr Remi Deter note reads:  I reviewed the diagnosis and pathology report. I emphasized her excellent prognosis. However I am very concerned about her still being on hormone replacement therapy for nearly 40 years. She seems surprised she needs to stop this but I tried to explain the importance of eliminating the progesterone and estrogen, it might help her to go to half dose on the estrogen for 2 weeks before she stops it. We discussed the expected menopausal symptoms. I also discussed the concept of chemoprevention but I am not sure she will be willing to take a hormone blocker. We will schedule for a base line bone density scan and I expect it to be good, but we will plan to repeat it in 2 years. She will proceed with radiation therapy, I will plan to see her back for follow up in 3 months for reexamination. At that time we will revisit the discussion on chemoprevention. Her questions were answered and she understand and agrees with this plan of care.     Symon Norwood,RN: Pt called to ask when she is to start the Tamoxifen?

## 2021-10-10 ENCOUNTER — Encounter: Payer: Self-pay | Admitting: Oncology

## 2021-10-10 NOTE — Progress Notes (Signed)
By signing my name below, I, Conard Novak, attest that this documentation has been prepared under the direction and in the presence of Derwood Kaplan, MD       Clinic Date: 09/09/2021   Medical oncology consultation     Subjective  Patient ID: Molly Ferrell, female    DOB: [birth year], 71 y.o.   MRN: [id number]   Chief complaint: Breast cancer     HPI   Molly Ferrell is here today to discuss her breast cancer that was found on a screening mammogram by calcifications of the right breast. She regularly gets them every year. She went on to have a diagnostic mammogram, which showed suspicious calcifications in a linear distribution spanning 2.5 cm in the medial right breast. She  had a biopsy on Jul 08, 2021 which revealed DCIS. On August 08, 2021, she had lumpectomy by Dr.Moorhead, and he went back for further excisions to be sure of clear margins.She is healing well, and just feels sore. She has seen Dr.Palermo and has been scheduled for radiation. The final pathology revealed an 8 mm grade 3 ductal carcinoma in situ with micro-calcifications and necrosis.  All margins were clear. Estrogen receptors were positive at 80% and progesterone were also positive at 80%.  The patient is still on hormone replacement therapy with estradiol 2 mg daily, progesterone at night and testosterone cream. She was started on this since her hysterectomy at age 39. She had her first period at 71 years old. She had a hysterectomy at 71 years old for endometriosis but she has half an ovary remaining. She is nulliparous.     INTERVAL HISTORY Molly Ferrell is here today to follow up discussion her DCIS. Her blood counts are slightly elevated with a hemoglobin of 16.6 and hematocrit 48.5, likely from her testosterone supplement . Her chemistries are normal.  I feel we need to wean her off the hormone replacement therapy and she will likely have difficulty with this.  She will stop taking there progesterone, she will  take a half pill on Estrace for two weeks and then stop. We briefly discussed mesopausal symptoms and what to expect and we discussed the concept of chemoprevention.  I am not sure she will be willing to do that since she will have a difficulty just coming off hormone replacement therapy.  We can discuss this further when she returns after radiation.  She was advised to get a bone density scan. Her last bone density was many years ago. I also recommend radiation and that is scheduled to start next week.            Past Medical History:  Diagnosis Date   Arthritis     Complication of anesthesia      "many years ago, had problems waking up"; patient stated no problems with recent surgeries 01/21/2018   GERD (gastroesophageal reflux disease)     Hard of hearing     History of bronchitis     Hypercholesteremia     Hypertriglyceridemia     Hypothyroidism     Macular degeneration of both eyes     OA (osteoarthritis)      severe right knee   Spondylolisthesis of lumbar region     Wears glasses             Past Surgical History:  Procedure Laterality Date   ABDOMINAL HYSTERECTOMY       APPENDECTOMY       BACK SURGERY  lumbar   CATARACT EXTRACTION W/ INTRAOCULAR LENS IMPLANT Bilateral     COLONOSCOPY       ESOPHAGOGASTRODUODENOSCOPY       JOINT REPLACEMENT       KNEE ARTHROPLASTY Left     TONSILLECTOMY       TOTAL KNEE ARTHROPLASTY Right 06/12/2016    Procedure: TOTAL KNEE ARTHROPLASTY;  Surgeon: Vickey Huger, MD;  Location: [place];  Service: Orthopedics;  Laterality: Right;   TOTAL KNEE REVISION Left 06/14/2015    Procedure: TOTAL KNEE REVISION POLY EXCHANGE;  Surgeon: Vickey Huger, MD;  Location: [place];  Service: Orthopedics;  Laterality: Left;  Has had surgery L3 and L4 on back        Family History  Problem Relation Age of Onset   Heart disease Mother     Heart disease Father    Fathers side (all sisters) had breast cancer     SOCIAL HISTORY She Is married of 61  years She is a former smoker but quit 15 years ago She drinks alcohol occasionally (weekend)   Social History         Socioeconomic History   Marital status: Married      Spouse name: Not on file   Number of children: Not on file   Years of education: Not on file   Highest education level: Not on file  Occupational History   Not on file  Tobacco Use   Smoking status: Former   Smokeless tobacco: Never   Tobacco comments:      "quit 10-15 years ago" 06/03/15  Vaping Use   Vaping Use: Never used  Substance and Sexual Activity   Alcohol use: Yes      Alcohol/week: 1.0 standard drink of alcohol      Types: 1 Glasses of wine per week      Comment: weekends   Drug use: No   Sexual activity: Not on file  Other Topics Concern   Not on file  Social History Narrative   Not on file    Social Determinants of Health    Financial Resource Strain: Not on file  Food Insecurity: Not on file  Transportation Needs: Not on file  Physical Activity: Not on file  Stress: Not on file  Social Connections: Not on file  Intimate Partner Violence: Not on file            Outpatient Medications Prior to Visit  Medication Sig Dispense Refill   Ascorbic Acid (VITAMIN C) 1000 MG tablet Take 1,000 mg by mouth daily.       aspirin EC 81 MG tablet Take 81 mg by mouth 2 (two) times daily.        atorvastatin (LIPITOR) 10 MG tablet Take 10 mg by mouth daily.       Cholecalciferol (VITAMIN D) 2000 units CAPS Take 2,000 Units by mouth daily.       cyclobenzaprine (FLEXERIL) 10 MG tablet Take 1 tablet (10 mg total) by mouth 3 (three) times daily as needed for muscle spasms. 50 tablet 0   docusate sodium (COLACE) 100 MG capsule Take 1 capsule (100 mg total) by mouth 2 (two) times daily. 60 capsule 0   estradiol (ESTRACE) 2 MG tablet Take 2 mg by mouth daily.       Lutein 40 MG CAPS Take 40 mg by mouth 2 (two) times daily. MORNING & AFTERNOON       MAGNESIUM GLYCINATE PO Take 400 mg by mouth at bedtime.  Melatonin 5 MG TABS Take 5 mg by mouth at bedtime.       niacin 500 MG tablet Take 500 mg by mouth 2 (two) times daily.        Omega-3 Fatty Acids (FISH OIL PO) Take 1,280 mg by mouth 2 (two) times daily.        omeprazole (PRILOSEC) 40 MG capsule Take 40 mg by mouth daily.       OVER THE COUNTER MEDICATION Take 1 capsule by mouth daily. DIM Enhanced Delivery System       oxyCODONE (OXY IR/ROXICODONE) 5 MG immediate release tablet Take 1 tablet (5 mg total) by mouth every 4 (four) hours as needed for moderate pain ((score 4 to 6)). 30 tablet 0   progesterone (PROMETRIUM) 200 MG capsule Take 400 mg by mouth at bedtime. Compounded at [facility] in [place].   1   Propylene Glycol (SYSTANE COMPLETE) 0.6 % SOLN Place 1 drop into both eyes daily.       Testosterone POWD Apply 1 application topically at bedtime. Testosterone Cream 0.25%       thyroid (ARMOUR) 60 MG tablet Take 60 mg by mouth daily before breakfast.       vitamin E 400 UNIT capsule Take 400 Units by mouth daily.        No facility-administered medications prior to visit.           Allergies  Allergen Reactions   Penicillins Hives and Itching      CHILDHOOD reaction to Pen G Has patient had a PCN reaction causing immediate rash, facial/tongue/throat swelling, SOB or lightheadedness with hypotension: No Has patient had a PCN reaction causing severe rash involving mucus membranes or skin necrosis: No Has patient had a PCN reaction that required hospitalization: No Has patient had a PCN reaction occurring within the last 10 years: No If all of the above answers are "NO", then may proceed with Cephalosporin use.            Review of Systems  Constitutional: Negative.   HENT: Negative.    Eyes: Negative.   Respiratory: Negative.    Cardiovascular: Negative.   Gastrointestinal: Negative.   Genitourinary: Negative.   Musculoskeletal: Negative.   Skin: Negative.   Neurological: Negative.   Endo/Heme/Allergies: Negative.    Psychiatric/Behavioral: Negative.            Objective:    Objective  Physical Exam Constitutional:      Appearance: Normal appearance. She is not ill-appearing or diaphoretic.  HENT:     Head: Normocephalic and atraumatic.     Right Ear: External ear normal.     Left Ear: External ear normal.     Nose: No congestion or rhinorrhea.  Eyes:     Extraocular Movements: Extraocular movements intact.     Pupils: Pupils are equal, round, and reactive to light.  Cardiovascular:     Rate and Rhythm: Normal rate and regular rhythm.     Pulses: Normal pulses.     Heart sounds: Normal heart sounds. No murmur heard.    No gallop.  Pulmonary:     Effort: Pulmonary effort is normal. No respiratory distress.     Breath sounds: Rales present. No wheezing.     Comments: Few faint rales at base Chest:     Chest wall: No mass.  Breasts:    Right: No mass.     Left: No mass.     Comments: Well healed scar in superior areolar complex  on the right    Musculoskeletal:     Cervical back: No tenderness.  Skin:    General: Skin is warm and dry.  Neurological:     Mental Status: She is alert and oriented to person, place, and time.  Psychiatric:        Judgment: Judgment normal.        There were no vitals taken for this visit.    Wt Readings from Last 3 Encounters:  03/03/19 137 lb (62.1 kg)  02/27/19 138 lb 14.4 oz (63 kg)  01/21/18 133 lb 4.8 oz (60.5 kg)      Diabetic Foot Exam - Simple   No data filed      Recent Labs       Lab Results  Component Value Date    WBC 16.7 (H) 03/04/2019    HGB 13.8 03/04/2019    HCT 40.3 03/04/2019    PLT 201 03/04/2019    GLUCOSE 115 (H) 03/04/2019    ALT 35 06/01/2016    AST 25 06/01/2016    NA 139 03/04/2019    K 4.2 03/04/2019    CL 106 03/04/2019    CREATININE 0.96 03/04/2019    BUN 11 03/04/2019    CO2 24 03/04/2019    INR 1.03 06/03/2015        Recent Labs  No results found for: "TSH"   Recent Labs       Lab Results   Component Value Date    WBC 16.7 (H) 03/04/2019    HGB 13.8 03/04/2019    HCT 40.3 03/04/2019    MCV 90.4 03/04/2019    PLT 201 03/04/2019      Recent Labs       Lab Results  Component Value Date    NA 139 03/04/2019    K 4.2 03/04/2019    CO2 24 03/04/2019    GLUCOSE 115 (H) 03/04/2019    BUN 11 03/04/2019    CREATININE 0.96 03/04/2019    BILITOT 0.7 06/01/2016    ALKPHOS 53 06/01/2016    AST 25 06/01/2016    ALT 35 06/01/2016    PROT 7.1 06/01/2016    ALBUMIN 3.9 06/01/2016    CALCIUM 8.9 03/04/2019    ANIONGAP 9 03/04/2019      Recent Labs  No results found for: "CHOL"   Recent Labs  No results found for: "HDL"   Recent Labs  No results found for: "LDLCALC"   Recent Labs  No results found for: "TRIG"   Recent Labs  No results found for: "CHOLHDL"   Recent Labs  No results found for: "HGBA1C"         Assessment & Plan:    Ductal carcinoma in situ right breast She has had resection and will be starting radiotherapy soon.  I reviewed the diagnosis and pathology report. I emphasized her excellent prognosis. However I am very concerned about her still being on hormone replacement therapy for nearly 40 years. She seems surprised she needs to stop this but I tried to explain the importance of eliminating the progesterone and estrogen, it might help her to go to half dose on the estrogen for 2 weeks before she stops it. We discussed the expected menopausal symptoms. I also discussed the concept of chemoprevention but I am not sure she will be willing to take a hormone blocker.      I will schedule her for a base line bone density scan and I expect it to  be good, but we will plan to repeat it in 2 years. She will proceed with radiation therapy, I will plan to see her back for follow up in 3 months for reexamination. At that time we will revisit the discussion on chemoprevention. Her questions were answered and she understand and agrees with this plan of care.  She  knows to contact our office if she develops concerns prior to her next appointment.     Problem List Items Addressed This Visit              Other    Ductal carcinoma in situ (DCIS) of right breast - Primary (Chronic)  (Remove chronic)         No orders of the defined types were placed in this encounter.     I, Conard Novak, personally preformed the services described in this documentation.  All medical record entries made by the scribe were at my direction and in my presence.  I have reviewed the chart and discharge instructions (if applicable) and agree that the record reflects my personal performance and is accurate and complete.          Conard Novak

## 2021-11-15 ENCOUNTER — Encounter: Payer: Self-pay | Admitting: Oncology

## 2021-12-12 ENCOUNTER — Telehealth: Payer: Self-pay | Admitting: Oncology

## 2021-12-12 ENCOUNTER — Encounter: Payer: Self-pay | Admitting: Oncology

## 2021-12-12 ENCOUNTER — Other Ambulatory Visit: Payer: Self-pay | Admitting: Oncology

## 2021-12-12 ENCOUNTER — Inpatient Hospital Stay: Payer: PPO | Attending: Oncology | Admitting: Oncology

## 2021-12-12 VITALS — BP 133/70 | HR 74 | Temp 97.7°F | Resp 19 | Ht 60.0 in | Wt 143.4 lb

## 2021-12-12 DIAGNOSIS — D0511 Intraductal carcinoma in situ of right breast: Secondary | ICD-10-CM | POA: Diagnosis not present

## 2021-12-12 MED ORDER — RALOXIFENE HCL 60 MG PO TABS: 60.0000 mg | ORAL_TABLET | Freq: Every day | ORAL | 5 refills | Status: DC

## 2021-12-12 NOTE — Progress Notes (Signed)
[facility] Date: 12/12/2021   Medical oncology follow-up visit     Subjective  Patient ID: Molly Ferrell, female    DOB: February 18, 1951, 71 y.o.   MRN: [id number]   Chief complaint: Breast cancer     HPI   Molly Ferrell is here today to discuss her breast cancer that was found on a screening mammogram by calcifications of the right breast. She regularly gets them every year. She went on to have a diagnostic mammogram, which showed suspicious calcifications in a linear distribution spanning 2.5 cm in the medial right breast. She  had a biopsy on Jul 08, 2021 which revealed DCIS. On August 08, 2021, she had lumpectomy by Dr.Moorhead, and he went back for further excisions to be sure of clear margins.She is healing well, and just feels sore. She has seen Dr.Palermo and has been scheduled for radiation. The final pathology revealed an 8 mm grade 3 ductal carcinoma in situ with micro-calcifications and necrosis.  All margins were clear. Estrogen receptors were positive at 80% and progesterone were also positive at 80%.  The patient is still on hormone replacement therapy with estradiol 2 mg daily, progesterone at night and testosterone cream. She was started on this since her hysterectomy at age 70. She had her first period at 71 years old. She had a hysterectomy at 71 years old for endometriosis but she has half an ovary remaining. She is nulliparous.     INTERVAL HISTORY Molly Ferrell is here today to follow up regarding her DCIS.  She has been weaned off her hormone therapy.  We discussed the concept of chemoprevention with raloxifene.  I reviewed the risks and benefits and she is willing to try this.  She does complain of some back pain at a 3 out of 10.  She had a bone density scan done on September 14 and this showed normal readings of the spine and forearm.  They were unable to do her hips.  She has completed her radiation without difficulty.  Her appetite is good and  her weight is up a few pounds.  She denies fevers chills or night sweats.  She denies any cardiorespiratory or gastrointestinal complaints.       Past Medical History:  Diagnosis Date   Arthritis     Complication of anesthesia      "many years ago, had problems waking up"; patient stated no problems with recent surgeries 01/21/2018   GERD (gastroesophageal reflux disease)     Hard of hearing     History of bronchitis     Hypercholesteremia     Hypertriglyceridemia     Hypothyroidism     Macular degeneration of both eyes     OA (osteoarthritis)      severe right knee   Spondylolisthesis of lumbar region     Wears glasses             Past Surgical History:  Procedure Laterality Date   ABDOMINAL HYSTERECTOMY       APPENDECTOMY       BACK SURGERY        lumbar   CATARACT EXTRACTION W/ INTRAOCULAR LENS IMPLANT Bilateral     COLONOSCOPY       ESOPHAGOGASTRODUODENOSCOPY       JOINT REPLACEMENT       KNEE ARTHROPLASTY Left     TONSILLECTOMY       TOTAL KNEE ARTHROPLASTY Right 06/12/2016    Procedure: TOTAL KNEE  ARTHROPLASTY;  Surgeon: Vickey Huger, MD;  Location: [place];  Service: Orthopedics;  Laterality: Right;   TOTAL KNEE REVISION Left 06/14/2015    Procedure: TOTAL KNEE REVISION POLY EXCHANGE;  Surgeon: Vickey Huger, MD;  Location: [place];  Service: Orthopedics;  Laterality: Left;  Has had surgery L3 and L4 on back        Family History  Problem Relation Age of Onset   Heart disease Mother     Heart disease Father    Fathers side (all sisters) had breast cancer     SOCIAL HISTORY She Is married of 52 years She is a former smoker but quit 15 years ago She drinks alcohol occasionally (weekend)   Social History         Socioeconomic History   Marital status: Married      Spouse name: Not on file   Number of children: Not on file   Years of education: Not on file   Highest education level: Not on file  Occupational History   Not on file  Tobacco Use   Smoking  status: Former   Smokeless tobacco: Never   Tobacco comments:      "quit 10-15 years ago" 06/03/15  Vaping Use   Vaping Use: Never used  Substance and Sexual Activity   Alcohol use: Yes      Alcohol/week: 1.0 standard drink of alcohol      Types: 1 Glasses of wine per week      Comment: weekends   Drug use: No   Sexual activity: Not on file  Other Topics Concern   Not on file  Social History Narrative   Not on file    Social Determinants of Health    Financial Resource Strain: Not on file  Food Insecurity: Not on file  Transportation Needs: Not on file  Physical Activity: Not on file  Stress: Not on file  Social Connections: Not on file  Intimate Partner Violence: Not on file            Outpatient Medications Prior to Visit  Medication Sig Dispense Refill   Ascorbic Acid (VITAMIN C) 1000 MG tablet Take 1,000 mg by mouth daily.       aspirin EC 81 MG tablet Take 81 mg by mouth 2 (two) times daily.        atorvastatin (LIPITOR) 10 MG tablet Take 10 mg by mouth daily.       Cholecalciferol (VITAMIN D) 2000 units CAPS Take 2,000 Units by mouth daily.       cyclobenzaprine (FLEXERIL) 10 MG tablet Take 1 tablet (10 mg total) by mouth 3 (three) times daily as needed for muscle spasms. 50 tablet 0   docusate sodium (COLACE) 100 MG capsule Take 1 capsule (100 mg total) by mouth 2 (two) times daily. 60 capsule 0   estradiol (ESTRACE) 2 MG tablet Take 2 mg by mouth daily.       Lutein 40 MG CAPS Take 40 mg by mouth 2 (two) times daily. MORNING & AFTERNOON       MAGNESIUM GLYCINATE PO Take 400 mg by mouth at bedtime.       Melatonin 5 MG TABS Take 5 mg by mouth at bedtime.       niacin 500 MG tablet Take 500 mg by mouth 2 (two) times daily.        Omega-3 Fatty Acids (FISH OIL PO) Take 1,280 mg by mouth 2 (two) times daily.  omeprazole (PRILOSEC) 40 MG capsule Take 40 mg by mouth daily.       OVER THE COUNTER MEDICATION Take 1 capsule by mouth daily. DIM Enhanced Delivery System        oxyCODONE (OXY IR/ROXICODONE) 5 MG immediate release tablet Take 1 tablet (5 mg total) by mouth every 4 (four) hours as needed for moderate pain ((score 4 to 6)). 30 tablet 0   progesterone (PROMETRIUM) 200 MG capsule Take 400 mg by mouth at bedtime. Compounded at [facility] in [place].   1   Propylene Glycol (SYSTANE COMPLETE) 0.6 % SOLN Place 1 drop into both eyes daily.       Testosterone POWD Apply 1 application topically at bedtime. Testosterone Cream 0.25%       thyroid (ARMOUR) 60 MG tablet Take 60 mg by mouth daily before breakfast.       vitamin E 400 UNIT capsule Take 400 Units by mouth daily.        No facility-administered medications prior to visit.           Allergies  Allergen Reactions   Penicillins Hives and Itching      CHILDHOOD reaction to Pen G Has patient had a PCN reaction causing immediate rash, facial/tongue/throat swelling, SOB or lightheadedness with hypotension: No Has patient had a PCN reaction causing severe rash involving mucus membranes or skin necrosis: No Has patient had a PCN reaction that required hospitalization: No Has patient had a PCN reaction occurring within the last 10 years: No If all of the above answers are "NO", then may proceed with Cephalosporin use.            Review of Systems  Constitutional: Negative.   HENT: Negative.    Eyes: Negative.   Respiratory: Negative.    Cardiovascular: Negative.   Gastrointestinal: Negative.   Genitourinary: Negative.   Musculoskeletal: Negative.   Skin: Negative.   Neurological: Negative.   Endo/Heme/Allergies: Negative.   Psychiatric/Behavioral: Negative.            Objective:    Objective  Physical Exam Constitutional:      Appearance: Normal appearance. She is not ill-appearing or diaphoretic.  HENT:     Head: Normocephalic and atraumatic.     Right Ear: External ear normal.     Left Ear: External ear normal.     Nose: No congestion or rhinorrhea.  Eyes:     Extraocular  Movements: Extraocular movements intact.     Pupils: Pupils are equal, round, and reactive to light.  Cardiovascular:     Rate and Rhythm: Normal rate and regular rhythm.     Pulses: Normal pulses.     Heart sounds: Normal heart sounds. No murmur heard.    No gallop.  Pulmonary:     Effort: Pulmonary effort is normal. No respiratory distress.     Breath sounds: No wheezing.     Comments: Chest:     Chest wall: No mass.  Breasts:    Right: No mass.     Left: No mass.     Comments: Well healed scar in superior areolar complex on the right She has mild radiation changes of the right mid breast.   Musculoskeletal:     Cervical back: No tenderness.  Skin:    General: Skin is warm and dry.  Neurological:     Mental Status: She is alert and oriented to person, place, and time.  Psychiatric:        Judgment: Judgment normal.  There were no vitals taken for this visit.    Wt Readings from Last 3 Encounters:  03/03/19 137 lb (62.1 kg)  02/27/19 138 lb 14.4 oz (63 kg)  01/21/18 133 lb 4.8 oz (60.5 kg)      Diabetic Foot Exam - Simple   No data filed      Recent Labs       Lab Results  Component Value Date    WBC 16.7 (H) 03/04/2019    HGB 13.8 03/04/2019    HCT 40.3 03/04/2019    PLT 201 03/04/2019    GLUCOSE 115 (H) 03/04/2019    ALT 35 06/01/2016    AST 25 06/01/2016    NA 139 03/04/2019    K 4.2 03/04/2019    CL 106 03/04/2019    CREATININE 0.96 03/04/2019    BUN 11 03/04/2019    CO2 24 03/04/2019    INR 1.03 06/03/2015        Recent Labs  No results found for: "TSH"   Recent Labs       Lab Results  Component Value Date    WBC 16.7 (H) 03/04/2019    HGB 13.8 03/04/2019    HCT 40.3 03/04/2019    MCV 90.4 03/04/2019    PLT 201 03/04/2019      Recent Labs       Lab Results  Component Value Date    NA 139 03/04/2019    K 4.2 03/04/2019    CO2 24 03/04/2019    GLUCOSE 115 (H) 03/04/2019    BUN 11 03/04/2019    CREATININE 0.96 03/04/2019     BILITOT 0.7 06/01/2016    ALKPHOS 53 06/01/2016    AST 25 06/01/2016    ALT 35 06/01/2016    PROT 7.1 06/01/2016    ALBUMIN 3.9 06/01/2016    CALCIUM 8.9 03/04/2019    ANIONGAP 9 03/04/2019      Recent Labs  No results found for: "CHOL"   Recent Labs  No results found for: "HDL"   Recent Labs  No results found for: "LDLCALC"   Recent Labs  No results found for: "TRIG"   Recent Labs  No results found for: "CHOLHDL"   Recent Labs  No results found for: "HGBA1C"         Assessment & Plan:    Ductal carcinoma in situ right breast She has had resection and has completed radiotherapy.  I reviewed the diagnosis and pathology report. I emphasized her excellent prognosis.  She has been weaned off her hormone replacement therapy.  I recommend chemoprevention with raloxifene, and we reviewed the risks and benefits.  She is willing to try this.     Her bone density scan looks quite good and is essentially normal in the spine and forearm.  I will plan to see her back for follow up in 3 months for reexamination.  I will order the raloxifene 60 mg daily and she is willing to try that.  Her questions were answered and she understand and agrees with this plan of care.  She knows to contact our office if she develops concerns prior to her next appointment.     Problem List Items Addressed This Visit              Other    Ductal carcinoma in situ (DCIS) of right breast - Primary           No orders of the defined types were placed in this  encounter.

## 2021-12-12 NOTE — Telephone Encounter (Signed)
Patient has been scheduled for follow-up visit per 12/12/21 los. Pt given an appt calendar with date and time.

## 2021-12-27 ENCOUNTER — Other Ambulatory Visit: Payer: Self-pay | Admitting: Neurosurgery

## 2022-01-04 ENCOUNTER — Other Ambulatory Visit: Payer: Self-pay | Admitting: Neurosurgery

## 2022-01-10 NOTE — Progress Notes (Signed)
Surgical Instructions    Your procedure is scheduled on Wednesday November 29th.  Report to [facility] Main Entrance "A" at 5:30 A.M., then check in with the Admitting office.  Call this number if you have problems the morning of surgery:  [phone]   If you have any questions prior to your surgery date call [phone]: Open Monday-Friday 8am-4pm If you experience any cold or flu symptoms such as cough, fever, chills, shortness of breath, etc. between now and your scheduled surgery, please notify us at the above number     Remember:  Do not eat or drink after midnight the night before your surgery      Take these medicines the morning of surgery with A SIP OF WATER: atorvastatin (LIPITOR) 10 MG tablet  omeprazole (PRILOSEC) 40 MG capsule  thyroid (ARMOUR) 60 MG tablet   IF NEEDED  acetaminophen (TYLENOL) 500 MG tablet  dicyclomine (BENTYL) 10 MG capsule    Follow your surgeon's instructions on when to stop Aspirin.  If no instructions were given by your surgeon then you will need to call the office to get those instructions.     As of today, STOP taking any Aspirin (unless otherwise instructed by your surgeon) CELEBREX, Aleve, Naproxen, Ibuprofen, Motrin, Advil, Goody's, BC's, all herbal medications, fish oil, and all vitamins.           Do not wear jewelry or makeup. Do not wear lotions, powders, perfumes or deodorant. Do not shave 48 hours prior to surgery.   Do not bring valuables to the hospital. Do not wear nail polish, gel polish, artificial nails, or any other type of covering on natural nails (fingers and toes) If you have artificial nails or gel coating that need to be removed by a nail salon, please have this removed prior to surgery. Artificial nails or gel coating may interfere with anesthesia's ability to adequately monitor your vital signs.  [facility] is not responsible for any belongings or valuables.    Do NOT Smoke (Tobacco/Vaping)  24 hours prior to  your procedure  If you use a CPAP at night, you may bring your mask for your overnight stay.   Contacts, glasses, hearing aids, dentures or partials may not be worn into surgery, please bring cases for these belongings   For patients admitted to the hospital, discharge time will be determined by your treatment team.   Patients discharged the day of surgery will not be allowed to drive home, and someone needs to stay with them for 24 hours.   SURGICAL WAITING ROOM VISITATION Patients having surgery or a procedure may have no more than 2 support people in the waiting area - these visitors may rotate.   Children under the age of 33 must have an adult with them who is not the patient. If the patient needs to stay at the hospital during part of their recovery, the visitor guidelines for inpatient rooms apply. Pre-op nurse will coordinate an appropriate time for 1 support person to accompany patient in pre-op.  This support person may not rotate.   Please refer to [email or web address] for the visitor guidelines for Inpatients (after your surgery is over and you are in a regular room).    Special instructions:    Oral Hygiene is also important to reduce your risk of infection.  Remember - BRUSH YOUR TEETH THE MORNING OF SURGERY WITH YOUR REGULAR TOOTHPASTE   [facility]- Preparing For Surgery  Before surgery, you can play an important role. Because  skin is not sterile, your skin needs to be as free of germs as possible. You can reduce the number of germs on your skin by washing with CHG (chlorahexidine gluconate) Soap before surgery.  CHG is an antiseptic cleaner which kills germs and bonds with the skin to continue killing germs even after washing.     Please do not use if you have an allergy to CHG or antibacterial soaps. If your skin becomes reddened/irritated stop using the CHG.  Do not shave (including legs and underarms) for at least 48  hours prior to first CHG shower. It is OK to shave your face.  Please follow these instructions carefully.     Shower the NIGHT BEFORE SURGERY and the MORNING OF SURGERY with CHG Soap.   If you chose to wash your hair, wash your hair first as usual with your normal shampoo. After you shampoo, rinse your hair and body thoroughly to remove the shampoo.  Then [employer] and genitals (private parts) with your normal soap and rinse thoroughly to remove soap.  After that Use CHG Soap as you would any other liquid soap. You can apply CHG directly to the skin and wash gently with a scrungie or a clean washcloth.   Apply the CHG Soap to your body ONLY FROM THE NECK DOWN.  Do not use on open wounds or open sores. Avoid contact with your eyes, ears, mouth and genitals (private parts). Wash Face and genitals (private parts)  with your normal soap.   Wash thoroughly, paying special attention to the area where your surgery will be performed.  Thoroughly rinse your body with warm water from the neck down.  DO NOT shower/wash with your normal soap after using and rinsing off the CHG Soap.  Pat yourself dry with a CLEAN TOWEL.  Wear CLEAN PAJAMAS to bed the night before surgery  Place CLEAN SHEETS on your bed the night before your surgery  DO NOT SLEEP WITH PETS.   Day of Surgery:  Take a shower with CHG soap. Wear Clean/Comfortable clothing the morning of surgery Do not apply any deodorants/lotions.   Remember to brush your teeth WITH YOUR REGULAR TOOTHPASTE.    If you received a COVID test during your pre-op visit, it is requested that you wear a mask when out in public, stay away from anyone that may not be feeling well, and notify your surgeon if you develop symptoms. If you have been in contact with anyone that has tested positive in the last 10 days, please notify your surgeon.    Please read over the following fact sheets that you were given.

## 2022-01-11 ENCOUNTER — Encounter (HOSPITAL_COMMUNITY): Payer: Self-pay

## 2022-01-11 ENCOUNTER — Encounter (HOSPITAL_COMMUNITY)
Admission: RE | Admit: 2022-01-11 | Discharge: 2022-01-11 | Disposition: A | Discharge status: Home / Self Care | Payer: PPO | Source: Ambulatory Visit | Attending: Neurosurgery | Admitting: Neurosurgery

## 2022-01-11 ENCOUNTER — Other Ambulatory Visit: Payer: Self-pay

## 2022-01-11 VITALS — BP 130/74 | HR 60 | Temp 97.4°F | Resp 18 | Ht 60.0 in | Wt 145.7 lb

## 2022-01-11 DIAGNOSIS — Z01812 Encounter for preprocedural laboratory examination: Secondary | ICD-10-CM | POA: Insufficient documentation

## 2022-01-11 DIAGNOSIS — Z01818 Encounter for other preprocedural examination: Secondary | ICD-10-CM

## 2022-01-11 LAB — TYPE AND SCREEN
ABO/RH(D): A POS
Antibody Screen: NEGATIVE

## 2022-01-11 LAB — CBC
HCT: 49 % — ABNORMAL HIGH (ref 36.0–46.0)
Hemoglobin: 16.7 g/dL — ABNORMAL HIGH (ref 12.0–15.0)
MCH: 31.5 pg (ref 26.0–34.0)
MCHC: 34.1 g/dL (ref 30.0–36.0)
MCV: 92.5 fL (ref 80.0–100.0)
Platelets: 206 10*3/uL (ref 150–400)
RBC: 5.3 MIL/uL — ABNORMAL HIGH (ref 3.87–5.11)
RDW: 12 % (ref 11.5–15.5)
WBC: 10.1 10*3/uL (ref 4.0–10.5)
nRBC: 0 % (ref 0.0–0.2)

## 2022-01-11 LAB — SURGICAL PCR SCREEN
MRSA, PCR: NEGATIVE
Staphylococcus aureus: NEGATIVE

## 2022-01-11 NOTE — Progress Notes (Signed)
PCP - Dr.Greg Bea Graff Cardiologist - pt denies  PPM/ICD - pt denies Device Orders - n/a Rep Notified - n/a  Chest x-ray - n/a EKG - 01/21/2018 Stress Test - more than 10 years ago-"normal exam" per pt ECHO - pt denies Cardiac Cath - pt denies  Sleep Study - pt denies CPAP - n/a  Diabetic-pt denies Blood Thinner Instructions: pt denies Aspirin Instructions:pt reports letter from surgeon's office to stop Celebrex, fish oil and ASA on 01/20/22  NPO after MN  COVID TEST- n/a   Anesthesia review: NO  Patient denies shortness of breath, fever, cough and chest pain at PAT appointment   All instructions explained to the patient, with a verbal understanding of the material. Patient agrees to go over the instructions while at home for a better understanding. Patient also instructed to self quarantine after being tested for COVID-19. The opportunity to ask questions was provided.

## 2022-01-24 NOTE — Anesthesia Preprocedure Evaluation (Addendum)
Anesthesia Evaluation  Patient identified by MRN, date of birth, ID band Patient awake    Reviewed: Allergy & Precautions, NPO status , Patient's Chart, lab work & pertinent test results  History of Anesthesia Complications Negative for: history of anesthetic complications  Airway Mallampati: I  TM Distance: >3 FB Neck ROM: Full    Dental  (+) Dental Advisory Given, Teeth Intact   Pulmonary former smoker   breath sounds clear to auscultation       Cardiovascular negative cardio ROS  Rhythm:Regular Rate:Normal     Neuro/Psych Back pain    GI/Hepatic Neg liver ROS,GERD  Medicated and Controlled,,  Endo/Other  Hypothyroidism    Renal/GU negative Renal ROS     Musculoskeletal  (+) Arthritis ,    Abdominal   Peds  Hematology negative hematology ROS (+)   Anesthesia Other Findings H/o breast cancer  Reproductive/Obstetrics                              Anesthesia Physical Anesthesia Plan  ASA: 2  Anesthesia Plan: General   Post-op Pain Management: Tylenol PO (pre-op)*   Induction: Intravenous  PONV Risk Score and Plan: 3 and Ondansetron, Dexamethasone, Midazolam and Treatment may vary due to age or medical condition  Airway Management Planned: Oral ETT  Additional Equipment: None  Intra-op Plan:   Post-operative Plan: Extubation in OR  Informed Consent: I have reviewed the patients History and Physical, chart, labs and discussed the procedure including the risks, benefits and alternatives for the proposed anesthesia with the patient or authorized representative who has indicated his/her understanding and acceptance.     Dental advisory given  Plan Discussed with: CRNA and Surgeon  Anesthesia Plan Comments:         Anesthesia Quick Evaluation

## 2022-01-25 ENCOUNTER — Inpatient Hospital Stay (HOSPITAL_COMMUNITY): Payer: PPO | Admitting: Anesthesiology

## 2022-01-25 ENCOUNTER — Other Ambulatory Visit: Payer: Self-pay

## 2022-01-25 ENCOUNTER — Inpatient Hospital Stay (HOSPITAL_COMMUNITY): Payer: PPO

## 2022-01-25 ENCOUNTER — Encounter (HOSPITAL_COMMUNITY)
Admission: RE | Disposition: A | Discharge status: Home / Self Care | Payer: Self-pay | Source: Home / Self Care | Attending: Neurosurgery

## 2022-01-25 ENCOUNTER — Inpatient Hospital Stay (HOSPITAL_COMMUNITY)
Admission: RE | Admit: 2022-01-25 | Discharge: 2022-01-26 | DRG: 455 | Disposition: A | Discharge status: Home / Self Care | Payer: PPO | Attending: Neurosurgery | Admitting: Neurosurgery

## 2022-01-25 ENCOUNTER — Encounter (HOSPITAL_COMMUNITY): Payer: Self-pay | Admitting: Neurosurgery

## 2022-01-25 DIAGNOSIS — M4316 Spondylolisthesis, lumbar region: Secondary | ICD-10-CM | POA: Diagnosis present

## 2022-01-25 DIAGNOSIS — M5116 Intervertebral disc disorders with radiculopathy, lumbar region: Secondary | ICD-10-CM

## 2022-01-25 DIAGNOSIS — K219 Gastro-esophageal reflux disease without esophagitis: Secondary | ICD-10-CM | POA: Diagnosis present

## 2022-01-25 DIAGNOSIS — Z8249 Family history of ischemic heart disease and other diseases of the circulatory system: Secondary | ICD-10-CM

## 2022-01-25 DIAGNOSIS — E039 Hypothyroidism, unspecified: Secondary | ICD-10-CM | POA: Diagnosis present

## 2022-01-25 DIAGNOSIS — Z853 Personal history of malignant neoplasm of breast: Secondary | ICD-10-CM

## 2022-01-25 DIAGNOSIS — Z7982 Long term (current) use of aspirin: Secondary | ICD-10-CM

## 2022-01-25 DIAGNOSIS — M48062 Spinal stenosis, lumbar region with neurogenic claudication: Principal | ICD-10-CM | POA: Diagnosis present

## 2022-01-25 DIAGNOSIS — Z87891 Personal history of nicotine dependence: Secondary | ICD-10-CM | POA: Diagnosis not present

## 2022-01-25 DIAGNOSIS — M5136 Other intervertebral disc degeneration, lumbar region: Secondary | ICD-10-CM | POA: Diagnosis present

## 2022-01-25 DIAGNOSIS — M51369 Other intervertebral disc degeneration, lumbar region without mention of lumbar back pain or lower extremity pain: Secondary | ICD-10-CM

## 2022-01-25 DIAGNOSIS — E7800 Pure hypercholesterolemia, unspecified: Secondary | ICD-10-CM | POA: Diagnosis present

## 2022-01-25 DIAGNOSIS — M47816 Spondylosis without myelopathy or radiculopathy, lumbar region: Secondary | ICD-10-CM | POA: Diagnosis present

## 2022-01-25 DIAGNOSIS — Z880 Allergy status to penicillin: Secondary | ICD-10-CM | POA: Diagnosis not present

## 2022-01-25 DIAGNOSIS — Z79890 Hormone replacement therapy (postmenopausal): Secondary | ICD-10-CM

## 2022-01-25 DIAGNOSIS — Z79899 Other long term (current) drug therapy: Secondary | ICD-10-CM

## 2022-01-25 DIAGNOSIS — Z96653 Presence of artificial knee joint, bilateral: Secondary | ICD-10-CM | POA: Diagnosis present

## 2022-01-25 DIAGNOSIS — E781 Pure hyperglyceridemia: Secondary | ICD-10-CM | POA: Diagnosis present

## 2022-01-25 SURGERY — POSTERIOR LUMBAR FUSION 1 LEVEL
Anesthesia: General

## 2022-01-25 MED ORDER — THYROID 60 MG PO TABS
60.0000 mg | ORAL_TABLET | Freq: Every day | ORAL | Status: DC
  Administered 2022-01-26: 60 mg via ORAL
  Filled 2022-01-25: qty 1

## 2022-01-25 MED ORDER — PHENYLEPHRINE HCL-NACL 20-0.9 MG/250ML-% IV SOLN
INTRAVENOUS | Status: DC | PRN
  Administered 2022-01-25: 40 ug/min via INTRAVENOUS

## 2022-01-25 MED ORDER — EPHEDRINE 5 MG/ML INJ
INTRAVENOUS | Status: AC
  Filled 2022-01-25: qty 5

## 2022-01-25 MED ORDER — HYDROMORPHONE HCL 1 MG/ML IJ SOLN
INTRAMUSCULAR | Status: AC
  Filled 2022-01-25: qty 1

## 2022-01-25 MED ORDER — OXYCODONE HCL 5 MG PO TABS
ORAL_TABLET | ORAL | Status: AC
  Filled 2022-01-25: qty 1

## 2022-01-25 MED ORDER — DEXAMETHASONE SODIUM PHOSPHATE 10 MG/ML IJ SOLN
INTRAMUSCULAR | Status: DC | PRN
  Administered 2022-01-25: 10 mg via INTRAVENOUS

## 2022-01-25 MED ORDER — SUGAMMADEX SODIUM 200 MG/2ML IV SOLN
INTRAVENOUS | Status: DC | PRN
  Administered 2022-01-25: 200 mg via INTRAVENOUS

## 2022-01-25 MED ORDER — VANCOMYCIN HCL 500 MG/100ML IV SOLN
500.0000 mg | Freq: Once | INTRAVENOUS | Status: AC
  Administered 2022-01-25: 500 mg via INTRAVENOUS
  Filled 2022-01-25: qty 100

## 2022-01-25 MED ORDER — BACITRACIN ZINC 500 UNIT/GM EX OINT
TOPICAL_OINTMENT | CUTANEOUS | Status: AC
  Filled 2022-01-25: qty 28.35

## 2022-01-25 MED ORDER — ZOLPIDEM TARTRATE 5 MG PO TABS: 5.0000 mg | ORAL_TABLET | Freq: Every evening | ORAL | Status: DC | PRN

## 2022-01-25 MED ORDER — DICYCLOMINE HCL 10 MG PO CAPS: 10.0000 mg | ORAL_CAPSULE | Freq: Four times a day (QID) | ORAL | Status: DC | PRN

## 2022-01-25 MED ORDER — BUPIVACAINE-EPINEPHRINE (PF) 0.5% -1:200000 IJ SOLN
INTRAMUSCULAR | Status: AC
  Filled 2022-01-25: qty 30

## 2022-01-25 MED ORDER — CHLORHEXIDINE GLUCONATE 0.12 % MT SOLN
15.0000 mL | Freq: Once | OROMUCOSAL | Status: AC
  Administered 2022-01-25: 15 mL via OROMUCOSAL
  Filled 2022-01-25: qty 15

## 2022-01-25 MED ORDER — CLINDAMYCIN PHOSPHATE 900 MG/50ML IV SOLN
INTRAVENOUS | Status: DC | PRN
  Administered 2022-01-25: 900 mg via INTRAVENOUS

## 2022-01-25 MED ORDER — PANTOPRAZOLE SODIUM 40 MG PO TBEC: 80.0000 mg | DELAYED_RELEASE_TABLET | Freq: Every day | ORAL | Status: DC

## 2022-01-25 MED ORDER — RALOXIFENE HCL 60 MG PO TABS
60.0000 mg | ORAL_TABLET | Freq: Every day | ORAL | Status: DC
  Filled 2022-01-25 (×2): qty 1

## 2022-01-25 MED ORDER — PROPOFOL 10 MG/ML IV BOLUS
INTRAVENOUS | Status: AC
  Filled 2022-01-25: qty 20

## 2022-01-25 MED ORDER — CYCLOBENZAPRINE HCL 10 MG PO TABS
10.0000 mg | ORAL_TABLET | Freq: Three times a day (TID) | ORAL | Status: DC | PRN
  Administered 2022-01-25: 10 mg via ORAL
  Filled 2022-01-25: qty 1

## 2022-01-25 MED ORDER — BUPIVACAINE-EPINEPHRINE (PF) 0.5% -1:200000 IJ SOLN
INTRAMUSCULAR | Status: DC | PRN
  Administered 2022-01-25: 10 mL via PERINEURAL

## 2022-01-25 MED ORDER — CHLORHEXIDINE GLUCONATE CLOTH 2 % EX PADS: 6.0000 | MEDICATED_PAD | Freq: Once | CUTANEOUS | Status: DC

## 2022-01-25 MED ORDER — ORAL CARE MOUTH RINSE: 15.0000 mL | Freq: Once | OROMUCOSAL | Status: AC

## 2022-01-25 MED ORDER — SODIUM CHLORIDE 0.9% FLUSH: 3.0000 mL | Freq: Two times a day (BID) | INTRAVENOUS | Status: DC

## 2022-01-25 MED ORDER — DOCUSATE SODIUM 100 MG PO CAPS
100.0000 mg | ORAL_CAPSULE | Freq: Two times a day (BID) | ORAL | Status: DC
  Administered 2022-01-25 (×2): 100 mg via ORAL
  Filled 2022-01-25 (×2): qty 1

## 2022-01-25 MED ORDER — MORPHINE SULFATE (PF) 4 MG/ML IV SOLN: 4.0000 mg | INTRAVENOUS | Status: DC | PRN

## 2022-01-25 MED ORDER — MIDAZOLAM HCL 2 MG/2ML IJ SOLN
INTRAMUSCULAR | Status: DC | PRN
  Administered 2022-01-25: 2 mg via INTRAVENOUS

## 2022-01-25 MED ORDER — BISACODYL 10 MG RE SUPP: 10.0000 mg | Freq: Every day | RECTAL | Status: DC | PRN

## 2022-01-25 MED ORDER — OXYCODONE HCL 5 MG/5ML PO SOLN: 5.0000 mg | Freq: Once | ORAL | Status: AC | PRN

## 2022-01-25 MED ORDER — SODIUM CHLORIDE 0.9% FLUSH: 3.0000 mL | INTRAVENOUS | Status: DC | PRN

## 2022-01-25 MED ORDER — OXYCODONE HCL 5 MG PO TABS
5.0000 mg | ORAL_TABLET | Freq: Once | ORAL | Status: AC | PRN
  Administered 2022-01-25: 5 mg via ORAL

## 2022-01-25 MED ORDER — ATORVASTATIN CALCIUM 10 MG PO TABS: 10.0000 mg | ORAL_TABLET | Freq: Every day | ORAL | Status: DC

## 2022-01-25 MED ORDER — VANCOMYCIN HCL IN DEXTROSE 1-5 GM/200ML-% IV SOLN
1000.0000 mg | INTRAVENOUS | Status: AC
  Administered 2022-01-25: 1000 mg via INTRAVENOUS
  Filled 2022-01-25: qty 200

## 2022-01-25 MED ORDER — ACETAMINOPHEN 325 MG PO TABS
650.0000 mg | ORAL_TABLET | ORAL | Status: DC | PRN
  Administered 2022-01-26: 650 mg via ORAL
  Filled 2022-01-25: qty 2

## 2022-01-25 MED ORDER — THROMBIN 5000 UNITS EX SOLR
OROMUCOSAL | Status: DC | PRN
  Administered 2022-01-25: 5 mL via TOPICAL

## 2022-01-25 MED ORDER — PHENOL 1.4 % MT LIQD: 1.0000 | OROMUCOSAL | Status: DC | PRN

## 2022-01-25 MED ORDER — ACETAMINOPHEN 650 MG RE SUPP: 650.0000 mg | RECTAL | Status: DC | PRN

## 2022-01-25 MED ORDER — ONDANSETRON HCL 4 MG PO TABS: 4.0000 mg | ORAL_TABLET | Freq: Four times a day (QID) | ORAL | Status: DC | PRN

## 2022-01-25 MED ORDER — LIDOCAINE 2% (20 MG/ML) 5 ML SYRINGE
INTRAMUSCULAR | Status: DC | PRN
  Administered 2022-01-25: 100 mg via INTRAVENOUS

## 2022-01-25 MED ORDER — PROMETHAZINE HCL 25 MG/ML IJ SOLN: 6.2500 mg | INTRAMUSCULAR | Status: DC | PRN

## 2022-01-25 MED ORDER — LIDOCAINE 2% (20 MG/ML) 5 ML SYRINGE
INTRAMUSCULAR | Status: AC
  Filled 2022-01-25: qty 5

## 2022-01-25 MED ORDER — ROCURONIUM BROMIDE 10 MG/ML (PF) SYRINGE
PREFILLED_SYRINGE | INTRAVENOUS | Status: DC | PRN
  Administered 2022-01-25: 100 mg via INTRAVENOUS

## 2022-01-25 MED ORDER — PROPOFOL 10 MG/ML IV BOLUS
INTRAVENOUS | Status: DC | PRN
  Administered 2022-01-25: 150 mg via INTRAVENOUS

## 2022-01-25 MED ORDER — MENTHOL 3 MG MT LOZG: 1.0000 | LOZENGE | OROMUCOSAL | Status: DC | PRN

## 2022-01-25 MED ORDER — MEPERIDINE HCL 25 MG/ML IJ SOLN: 6.2500 mg | INTRAMUSCULAR | Status: DC | PRN

## 2022-01-25 MED ORDER — DEXAMETHASONE SODIUM PHOSPHATE 10 MG/ML IJ SOLN
INTRAMUSCULAR | Status: AC
  Filled 2022-01-25: qty 1

## 2022-01-25 MED ORDER — OXYCODONE HCL 5 MG PO TABS
5.0000 mg | ORAL_TABLET | ORAL | Status: DC | PRN
  Administered 2022-01-25: 5 mg via ORAL
  Filled 2022-01-25 (×2): qty 1

## 2022-01-25 MED ORDER — MIDAZOLAM HCL 2 MG/2ML IJ SOLN: 0.5000 mg | Freq: Once | INTRAMUSCULAR | Status: DC | PRN

## 2022-01-25 MED ORDER — EPHEDRINE SULFATE-NACL 50-0.9 MG/10ML-% IV SOSY
PREFILLED_SYRINGE | INTRAVENOUS | Status: DC | PRN
  Administered 2022-01-25: 10 mg via INTRAVENOUS

## 2022-01-25 MED ORDER — BUPIVACAINE LIPOSOME 1.3 % IJ SUSP
INTRAMUSCULAR | Status: DC | PRN
  Administered 2022-01-25: 20 mL

## 2022-01-25 MED ORDER — SODIUM CHLORIDE 0.9 % IV SOLN
250.0000 mL | INTRAVENOUS | Status: DC
  Administered 2022-01-25: 250 mL via INTRAVENOUS

## 2022-01-25 MED ORDER — LACTATED RINGERS IV SOLN: INTRAVENOUS | Status: DC

## 2022-01-25 MED ORDER — FENTANYL CITRATE (PF) 250 MCG/5ML IJ SOLN
INTRAMUSCULAR | Status: DC | PRN
  Administered 2022-01-25 (×2): 50 ug via INTRAVENOUS
  Administered 2022-01-25: 100 ug via INTRAVENOUS
  Administered 2022-01-25: 50 ug via INTRAVENOUS

## 2022-01-25 MED ORDER — ONDANSETRON HCL 4 MG/2ML IJ SOLN: 4.0000 mg | Freq: Four times a day (QID) | INTRAMUSCULAR | Status: DC | PRN

## 2022-01-25 MED ORDER — HYDROMORPHONE HCL 1 MG/ML IJ SOLN
0.2500 mg | INTRAMUSCULAR | Status: DC | PRN
  Administered 2022-01-25 (×2): 0.25 mg via INTRAVENOUS
  Administered 2022-01-25 (×2): 0.5 mg via INTRAVENOUS

## 2022-01-25 MED ORDER — ACETAMINOPHEN 500 MG PO TABS
1000.0000 mg | ORAL_TABLET | Freq: Four times a day (QID) | ORAL | Status: AC
  Administered 2022-01-25 – 2022-01-26 (×4): 1000 mg via ORAL
  Filled 2022-01-25 (×4): qty 2

## 2022-01-25 MED ORDER — ONDANSETRON HCL 4 MG/2ML IJ SOLN
INTRAMUSCULAR | Status: DC | PRN
  Administered 2022-01-25: 4 mg via INTRAVENOUS

## 2022-01-25 MED ORDER — ONDANSETRON HCL 4 MG/2ML IJ SOLN
INTRAMUSCULAR | Status: AC
  Filled 2022-01-25: qty 2

## 2022-01-25 MED ORDER — BACITRACIN ZINC 500 UNIT/GM EX OINT
TOPICAL_OINTMENT | CUTANEOUS | Status: DC | PRN
  Administered 2022-01-25: 1 via TOPICAL

## 2022-01-25 MED ORDER — FENTANYL CITRATE (PF) 250 MCG/5ML IJ SOLN
INTRAMUSCULAR | Status: AC
  Filled 2022-01-25: qty 5

## 2022-01-25 MED ORDER — THROMBIN 5000 UNITS EX SOLR
CUTANEOUS | Status: AC
  Filled 2022-01-25: qty 5000

## 2022-01-25 MED ORDER — BUPIVACAINE LIPOSOME 1.3 % IJ SUSP
INTRAMUSCULAR | Status: AC
  Filled 2022-01-25: qty 20

## 2022-01-25 MED ORDER — ACETAMINOPHEN 500 MG PO TABS
1000.0000 mg | ORAL_TABLET | Freq: Once | ORAL | Status: AC
  Administered 2022-01-25: 1000 mg via ORAL
  Filled 2022-01-25: qty 2

## 2022-01-25 MED ORDER — OXYCODONE HCL 5 MG PO TABS
10.0000 mg | ORAL_TABLET | ORAL | Status: DC | PRN
  Administered 2022-01-25: 10 mg via ORAL
  Filled 2022-01-25: qty 2

## 2022-01-25 MED ORDER — 0.9 % SODIUM CHLORIDE (POUR BTL) OPTIME
TOPICAL | Status: DC | PRN
  Administered 2022-01-25: 1000 mL

## 2022-01-25 MED ORDER — MIDAZOLAM HCL 2 MG/2ML IJ SOLN
INTRAMUSCULAR | Status: AC
  Filled 2022-01-25: qty 2

## 2022-01-25 MED ORDER — ROCURONIUM BROMIDE 10 MG/ML (PF) SYRINGE
PREFILLED_SYRINGE | INTRAVENOUS | Status: AC
  Filled 2022-01-25: qty 10

## 2022-01-25 SURGICAL SUPPLY — 64 items
BAG COUNTER SPONGE SURGICOUNT (BAG) ×1 IMPLANT
BASKET BONE COLLECTION (BASKET) ×1 IMPLANT
BENZOIN TINCTURE PRP APPL 2/3 (GAUZE/BANDAGES/DRESSINGS) ×1 IMPLANT
BLADE CLIPPER SURG (BLADE) IMPLANT
BUR MATCHSTICK NEURO 3.0 LAGG (BURR) ×1 IMPLANT
BUR PRECISION FLUTE 6.0 (BURR) ×1 IMPLANT
CANISTER SUCT 3000ML PPV (MISCELLANEOUS) ×1 IMPLANT
CAP LOCK DLX THRD (Cap) IMPLANT
CAP REVERE LOCKING (Cap) IMPLANT
CNTNR URN SCR LID CUP LEK RST (MISCELLANEOUS) ×1 IMPLANT
CONT SPEC 4OZ STRL OR WHT (MISCELLANEOUS) ×1
COVER BACK TABLE 60X90IN (DRAPES) ×1 IMPLANT
DRAPE C-ARM 42X72 X-RAY (DRAPES) ×2 IMPLANT
DRAPE HALF SHEET 40X57 (DRAPES) ×1 IMPLANT
DRAPE LAPAROTOMY 100X72X124 (DRAPES) ×1 IMPLANT
DRAPE SURG 17X23 STRL (DRAPES) ×4 IMPLANT
DRSG OPSITE POSTOP 4X6 (GAUZE/BANDAGES/DRESSINGS) ×1 IMPLANT
DRSG OPSITE POSTOP 4X8 (GAUZE/BANDAGES/DRESSINGS) IMPLANT
ELECT BLADE 4.0 EZ CLEAN MEGAD (MISCELLANEOUS) ×1
ELECT REM PT RETURN 9FT ADLT (ELECTROSURGICAL) ×1
ELECTRODE BLDE 4.0 EZ CLN MEGD (MISCELLANEOUS) ×1 IMPLANT
ELECTRODE REM PT RTRN 9FT ADLT (ELECTROSURGICAL) ×1 IMPLANT
EVACUATOR 1/8 PVC DRAIN (DRAIN) IMPLANT
GAUZE 4X4 16PLY ~~LOC~~+RFID DBL (SPONGE) ×1 IMPLANT
GLOVE BIO SURGEON STRL SZ 6 (GLOVE) ×1 IMPLANT
GLOVE BIO SURGEON STRL SZ8 (GLOVE) ×2 IMPLANT
GLOVE BIO SURGEON STRL SZ8.5 (GLOVE) ×2 IMPLANT
GLOVE BIOGEL PI IND STRL 6.5 (GLOVE) ×1 IMPLANT
GLOVE EXAM NITRILE XL STR (GLOVE) IMPLANT
GOWN STRL REUS W/ TWL LRG LVL3 (GOWN DISPOSABLE) ×1 IMPLANT
GOWN STRL REUS W/ TWL XL LVL3 (GOWN DISPOSABLE) ×2 IMPLANT
GOWN STRL REUS W/TWL 2XL LVL3 (GOWN DISPOSABLE) IMPLANT
GOWN STRL REUS W/TWL LRG LVL3 (GOWN DISPOSABLE) ×1
GOWN STRL REUS W/TWL XL LVL3 (GOWN DISPOSABLE) ×4
HEMOSTAT POWDER KIT SURGIFOAM (HEMOSTASIS) ×1 IMPLANT
KIT BASIN OR (CUSTOM PROCEDURE TRAY) ×1 IMPLANT
KIT GRAFTMAG DEL NEURO DISP (NEUROSURGERY SUPPLIES) IMPLANT
KIT TURNOVER KIT B (KITS) ×1 IMPLANT
NDL HYPO 21X1.5 SAFETY (NEEDLE) IMPLANT
NEEDLE HYPO 21X1.5 SAFETY (NEEDLE) ×1 IMPLANT
NEEDLE HYPO 22GX1.5 SAFETY (NEEDLE) ×1 IMPLANT
NS IRRIG 1000ML POUR BTL (IV SOLUTION) ×1 IMPLANT
PACK LAMINECTOMY NEURO (CUSTOM PROCEDURE TRAY) ×1 IMPLANT
PAD ARMBOARD 7.5X6 YLW CONV (MISCELLANEOUS) ×3 IMPLANT
PATTIES SURGICAL .5 X1 (DISPOSABLE) IMPLANT
PUTTY DBM 5CC CALC GRAN (Putty) IMPLANT
ROD CURVED TI 6.35X90 (Rod) IMPLANT
SCREW PA DLX CREO 7.5X50 (Screw) IMPLANT
SPACER ALTERA 10X31 8-12MM-8 (Spacer) IMPLANT
SPIKE FLUID TRANSFER (MISCELLANEOUS) ×1 IMPLANT
SPONGE NEURO XRAY DETECT 1X3 (DISPOSABLE) IMPLANT
SPONGE SURGIFOAM ABS GEL 100 (HEMOSTASIS) IMPLANT
SPONGE T-LAP 4X18 ~~LOC~~+RFID (SPONGE) IMPLANT
STRIP CLOSURE SKIN 1/2X4 (GAUZE/BANDAGES/DRESSINGS) ×1 IMPLANT
SUT VIC AB 1 CT1 18XBRD ANBCTR (SUTURE) ×2 IMPLANT
SUT VIC AB 1 CT1 8-18 (SUTURE) ×2
SUT VIC AB 2-0 CP2 18 (SUTURE) ×2 IMPLANT
SYR 20ML LL LF (SYRINGE) IMPLANT
TAP SURG GLBU 6.5X40 (ORTHOPEDIC DISPOSABLE SUPPLIES) IMPLANT
TOWEL GREEN STERILE (TOWEL DISPOSABLE) ×1 IMPLANT
TOWEL GREEN STERILE FF (TOWEL DISPOSABLE) ×1 IMPLANT
TRAY FOLEY MTR SLVR 14FR STAT (SET/KITS/TRAYS/PACK) IMPLANT
TRAY FOLEY MTR SLVR 16FR STAT (SET/KITS/TRAYS/PACK) ×1 IMPLANT
WATER STERILE IRR 1000ML POUR (IV SOLUTION) ×1 IMPLANT

## 2022-01-25 NOTE — Op Note (Signed)
Brief history: The patient is a 71 year old white female on whom I previous performed lumbar fusions.  She is developed recurrent back and right greater left leg pain.  She has failed medical management.  She was worked up with a lumbar MRI and lumbar x-rays which demonstrated lumbar adjacent disease and spondylolisthesis with spinal stenosis.  I discussed the various treatment options.  She has decided proceed with surgery.  Preoperative diagnosis: Lumbar adjacent segment disease with spondylolisthesis, degenerative disc disease, spinal stenosis compressing both the L2 and the L3 nerve roots; lumbago; lumbar radiculopathy; neurogenic claudication  Postoperative diagnosis: The same  Procedure: Bilateral L2-3 laminotomy/foraminotomies/medial facetectomy to decompress the bilateral L2 and L3 nerve roots(the work required to do this was in addition to the work required to do the posterior lumbar interbody fusion because of the patient's spinal stenosis, facet arthropathy. Etc. requiring a wide decompression of the nerve roots.);  Right L2-3 transforaminal lumbar interbody fusion with local morselized autograft bone and Zimmer DBM; insertion of interbody prosthesis at L2-3 (globus peek expandable interbody prosthesis); posterior segmental instrumentation from L2 to L5 with globus titanium pedicle screws and rods; posterior lateral arthrodesis at L2-3 with local morselized autograft bone and Zimmer DBM; exploration of lumbar fusion/removal of lumbar hardware.  Surgeon: Dr. Earle Gell  Asst.: Arnetha Massy, NP  Anesthesia: Gen. endotracheal  Estimated blood loss: 100 cc  Drains: None  Complications: None  Description of procedure: The patient was brought to the operating room by the anesthesia team. General endotracheal anesthesia was induced. The patient was turned to the prone position on the Wilson frame. The patient's lumbosacral region was then prepared with Betadine scrub and Betadine solution.  Sterile drapes were applied.  I then injected the area to be incised with Marcaine with epinephrine solution. I then used the scalpel to make a linear midline incision over the L2-3, L3-4 and L4-5 interspace, incising through the old surgical scar. I then used electrocautery to perform a bilateral subperiosteal dissection exposing the spinous process and lamina of L2-3, L3-4 and L4-5, and exposing the old hardware from L3-L5 bilaterally.  We then inserted the Verstrac retractor to provide exposure.  We explored the fusion by removing the caps from the old screws and then removing the rods.  We inspected the arthrodesis at L3-4 and L4-5.  It appeared solid.  I began the decompression by using the high speed drill to perform laminotomies at L2-3 bilaterally. We then used the Kerrison punches to widen the laminotomy and removed the ligamentum flavum at L2-3 bilaterally. We used the Kerrison punches to remove the medial facets at L2-3 bilaterally, we removed the right L2-3 facet. We performed wide foraminotomies about the bilateral L2 and L3 nerve roots completing the decompression.  We now turned our attention to the posterior lumbar interbody fusion. I used a scalpel to incise the intervertebral disc at L2-3 bilaterally. I then performed a partial intervertebral discectomy at L2-3 bilaterally using the pituitary forceps. We prepared the vertebral endplates at W1-1 bilaterally for the fusion by removing the soft tissues with the curettes. We then used the trial spacers to pick the appropriate sized interbody prosthesis. We prefilled his prosthesis with a combination of local morselized autograft bone that we obtained during the decompression as well as Zimmer DBM. We inserted the prefilled prosthesis into the interspace at L2-3 from the right, we then turned and expanded the prosthesis. There was a good snug fit of the prosthesis in the interspace. We then filled and the remainder of  the intervertebral disc  space with local morselized autograft bone and Zimmer DBM. This completed the posterior lumbar interbody arthrodesis.  During the decompression and insertion of the prosthesis the assistant protected the thecal sac and nerve roots with the D'Errico retractor.  We now turned attention to the instrumentation. Under fluoroscopic guidance we cannulated the bilateral L2 pedicles with the bone probe. We then removed the bone probe. We then tapped the pedicle with a 6.5 millimeter tap. We then removed the tap. We probed inside the tapped pedicle with a ball probe to rule out cortical breaches. We then inserted a 7.5 x 50 millimeter pedicle screw into the L2 pedicles bilaterally under fluoroscopic guidance. We then palpated along the medial aspect of the pedicles to rule out cortical breaches. There were none. The nerve roots were not injured. We then connected the unilateral pedicle screws with a lordotic rod. We compressed the construct and secured the rod in place with the caps. We then tightened the caps appropriately. This completed the instrumentation from L2-L5 bilaterally.  We now turned our attention to the posterior lateral arthrodesis at L2-3. We used the high-speed drill to decorticate the remainder of the facets, pars, transverse process at L2-3. We then applied a combination of local morselized autograft bone and Zimmer DBM over these decorticated posterior lateral structures. This completed the posterior lateral arthrodesis.  We then obtained hemostasis using bipolar electrocautery. We irrigated the wound out with saline solution. We inspected the thecal sac and nerve roots and noted they were well decompressed. We then removed the retractor.  We injected Exparel . We reapproximated patient's thoracolumbar fascia with interrupted #1 Vicryl suture. We reapproximated patient's subcutaneous tissue with interrupted 2-0 Vicryl suture. The reapproximated patient's skin with Steri-Strips and benzoin. The wound  was then coated with bacitracin ointment. A sterile dressing was applied. The drapes were removed. The patient was subsequently returned to the supine position where they were extubated by the anesthesia team. He was then transported to the post anesthesia care unit in stable condition. All sponge instrument and needle counts were reportedly correct at the end of this case.

## 2022-01-25 NOTE — Anesthesia Procedure Notes (Signed)
Procedure Name: Intubation Date/Time: 01/25/2022 7:51 AM  Performed by: Lance Coon, CRNAPre-anesthesia Checklist: Patient identified, Emergency Drugs available, Suction available, Patient being monitored and Timeout performed Patient Re-evaluated:Patient Re-evaluated prior to induction Oxygen Delivery Method: Circle system utilized Preoxygenation: Pre-oxygenation with 100% oxygen Induction Type: IV induction Ventilation: Mask ventilation without difficulty Laryngoscope Size: Miller and 3 Grade View: Grade I Tube type: Oral Tube size: 7.0 mm Number of attempts: 1 Airway Equipment and Method: Stylet Placement Confirmation: ETT inserted through vocal cords under direct vision, positive ETCO2 and breath sounds checked- equal and bilateral Secured at: 21 cm Tube secured with: Tape Dental Injury: Teeth and Oropharynx as per pre-operative assessment

## 2022-01-25 NOTE — Anesthesia Postprocedure Evaluation (Signed)
Anesthesia Post Note  Patient: Molly Ferrell  Procedure(s) Performed: Posterior Lumbar Interbody Fusion ,Instrumented Prothesis,Posterior  Instrumentation, Lumbar two-three;Explore previous Fusion     Patient location during evaluation: PACU Anesthesia Type: General Level of consciousness: awake and alert, patient cooperative and oriented Pain management: pain level controlled Vital Signs Assessment: post-procedure vital signs reviewed and stable Respiratory status: spontaneous breathing, nonlabored ventilation and respiratory function stable Cardiovascular status: blood pressure returned to baseline and stable Postop Assessment: no apparent nausea or vomiting Anesthetic complications: no   No notable events documented.  Last Vitals:  Vitals:   01/25/22 1230 01/25/22 1255  BP: 121/68 119/70  Pulse: 85 95  Resp: 14 20  Temp: 36.9 C (!) 36.4 C  SpO2: 92% 96%    Last Pain:  Vitals:   01/25/22 1255  TempSrc: Oral  PainSc: 5                  Chistine Dematteo,E. Allina Riches

## 2022-01-25 NOTE — H&P (Signed)
Subjective: The patient is a 71 year old white female on whom I performed a previous lumbar fusion.  She did well initially but has developed back and right greater than left leg pain consistent with neurogenic claudication.  She failed medical management and was worked up with a lumbar MRI and lumbar x-rays which demonstrated adjacent disease with spondylolisthesis and spinal stenosis.  I discussed the various treatment options with her.  She has decided proceed with surgery.  Past Medical History:  Diagnosis Date   Arthritis    Cancer ([place]) 07/2021   Right Breast Cancer-Stage 0; 1 month of radation per pt   Complication of anesthesia    "many years ago, had problems waking up"; patient stated no problems with recent surgeries 01/21/2018   GERD (gastroesophageal reflux disease)    Hard of hearing    History of bronchitis    Hypercholesteremia    Hypertriglyceridemia    Hypothyroidism    Macular degeneration of both eyes    OA (osteoarthritis)    severe right knee   Spondylolisthesis of lumbar region    Wears glasses     Past Surgical History:  Procedure Laterality Date   ABDOMINAL HYSTERECTOMY     APPENDECTOMY     BACK SURGERY     lumbar   BREAST LUMPECTOMY Right 07/2021   CATARACT EXTRACTION W/ INTRAOCULAR LENS IMPLANT Bilateral    COLONOSCOPY     ESOPHAGOGASTRODUODENOSCOPY     JOINT REPLACEMENT     bil hip replacements   KNEE ARTHROPLASTY Left    TONSILLECTOMY     TOTAL KNEE ARTHROPLASTY Right 06/12/2016   Procedure: TOTAL KNEE ARTHROPLASTY;  Surgeon: Vickey Huger, MD;  Location: [place];  Service: Orthopedics;  Laterality: Right;   TOTAL KNEE REVISION Left 06/14/2015   Procedure: TOTAL KNEE REVISION POLY EXCHANGE;  Surgeon: Vickey Huger, MD;  Location: [place];  Service: Orthopedics;  Laterality: Left;    Allergies  Allergen Reactions   Penicillins Hives and Itching    CHILDHOOD reaction to Pen G Has patient had a PCN reaction causing immediate rash, facial/tongue/throat  swelling, SOB or lightheadedness with hypotension: No Has patient had a PCN reaction causing severe rash involving mucus membranes or skin necrosis: No Has patient had a PCN reaction that required hospitalization: No Has patient had a PCN reaction occurring within the last 10 years: No If all of the above answers are "NO", then may proceed with Cephalosporin use.       Social History   Tobacco Use   Smoking status: Former   Smokeless tobacco: Never   Tobacco comments:    "quit 10-15 years ago" 06/03/15  Substance Use Topics   Alcohol use: Yes    Alcohol/week: 2.0 standard drinks of alcohol    Types: 2 Glasses of wine per week    Comment: weekends    Family History  Problem Relation Age of Onset   Heart disease Mother    Heart disease Father    Prior to Admission medications   Medication Sig Start Date End Date Taking? Authorizing Provider  acetaminophen (TYLENOL) 500 MG tablet Take 1,000 mg by mouth every 6 (six) hours as needed for moderate pain.   Yes [provider]  Ascorbic Acid (VITAMIN C) 1000 MG tablet Take 1,000 mg by mouth daily.   Yes [provider]  aspirin EC 81 MG tablet Take 81 mg by mouth 2 (two) times daily. Swallow whole.   Yes [provider]  atorvastatin (LIPITOR) 10 MG tablet Take  10 mg by mouth daily. 01/20/19  Yes [provider]  celecoxib (CELEBREX) 200 MG capsule Take 200 mg by mouth daily. 08/26/21  Yes [provider]  cholecalciferol (VITAMIN D3) 25 MCG (1000 UNIT) tablet Take 1,000 Units by mouth in the morning and at bedtime.   Yes [provider]  dicyclomine (BENTYL) 10 MG capsule Take 10 mg by mouth every 6 (six) hours as needed for spasms. 07/31/19  Yes [provider]  fish oil-omega-3 fatty acids 1000 MG capsule Take 1 g by mouth daily.   Yes [provider]  Lutein 20 MG TABS Take 20 mg by mouth 2 (two) times daily.   Yes [provider]  MAGNESIUM GLYCINATE PO  Take 350 mg by mouth at bedtime.   Yes [provider]  Melatonin 5 MG TABS Take 5 mg by mouth at bedtime.   Yes [provider]  niacin 500 MG tablet Take 500 mg by mouth 2 (two) times daily.    Yes [provider]  omeprazole (PRILOSEC) 40 MG capsule Take 40 mg by mouth daily. 02/16/21  Yes [provider]  raloxifene (EVISTA) 60 MG tablet Take 1 tablet (60 mg total) by mouth daily. 12/12/21  Yes Derwood Kaplan, MD  Testosterone POWD Apply 1 Dose topically daily. Testosterone Cream 2% - APPLY 4.1PF (2 CLICKS) TO LABIA ONCE A DAY.   Yes [provider]  thyroid (ARMOUR) 60 MG tablet Take 60 mg by mouth daily before breakfast.   Yes [provider]  vitamin E 400 UNIT capsule Take 400 Units by mouth daily.   Yes [provider]     Review of Systems  Positive ROS: As above  All other systems have been reviewed and were otherwise negative with the exception of those mentioned in the HPI and as above.  Objective: Vital signs in last 24 hours: Temp:  [98.3 F (36.8 C)] 98.3 F (36.8 C) (11/29 0626) Pulse Rate:  [55] 55 (11/29 0626) Resp:  [18] 18 (11/29 0626) BP: (131)/(80) 131/80 (11/29 0626) SpO2:  [94 %] 94 % (11/29 0626) Weight:  [64 kg] 64 kg (11/29 0626) Estimated body mass index is 27.54 kg/m as calculated from the following:   Height as of this encounter: 5' (1.524 m).   Weight as of this encounter: 64 kg.   General Appearance: Alert Head: Normocephalic, without obvious abnormality, atraumatic Eyes: PERRL, conjunctiva/corneas clear, EOM's intact,    Ears: Normal  Throat: Normal  Neck: Supple, Back: Her lumbar incision is well-healed.   Lungs: Clear to auscultation bilaterally, respirations unlabored Heart: Regular rate and rhythm, no murmur, rub or gallop Abdomen: Soft, non-tender Extremities: Extremities normal, atraumatic, no cyanosis or edema Skin: unremarkable  NEUROLOGIC:   Mental status:  alert and oriented,Motor Exam - grossly normal Sensory Exam - grossly normal Reflexes:  Coordination - grossly normal Gait - grossly normal Balance - grossly normal Cranial Nerves: I: smell Not tested  II: visual acuity  OS: Normal  OD: Normal   II: visual fields Full to confrontation  II: pupils Equal, round, reactive to light  III,VII: ptosis None  III,IV,VI: extraocular muscles  Full ROM  V: mastication Normal  V: facial light touch sensation  Normal  V,VII: corneal reflex  Present  VII: facial muscle function - upper  Normal  VII: facial muscle function - lower Normal  VIII: hearing Not tested  IX: soft palate elevation  Normal  IX,X: gag reflex Present  XI: trapezius strength  5/5  XI: sternocleidomastoid strength 5/5  XI: neck flexion strength  5/5  XII: tongue strength  Normal    Data Review Lab Results  Component Value Date   WBC 10.1 01/11/2022   HGB 16.7 (H) 01/11/2022   HCT 49.0 (H) 01/11/2022   MCV 92.5 01/11/2022   PLT 206 01/11/2022   Lab Results  Component Value Date   NA 138 09/09/2021   K 4.0 09/09/2021   CL 107 09/09/2021   CO2 21 09/09/2021   BUN 19 09/09/2021   CREATININE 0.8 09/09/2021   GLUCOSE 115 (H) 03/04/2019   Lab Results  Component Value Date   INR 1.03 06/03/2015    Assessment/Plan: Lumbar adjacent disease with spondylolisthesis, lumbar spinal stenosis, lumbago, neurogenic claudication, lumbar radiculopathy: I have discussed the situation with the patient.  I have reviewed her imaging studies with her and pointed out the abnormalities.  We have discussed the various treatment options including surgery.  I have described the surgical treatment option of an L2-3 decompression, instrumentation and fusion.  I have shown her surgical models.  I have given her surgical pamphlet.  We have discussed the risk, benefits, alternatives, expected postoperative course, and likelihood of achieving our goals with surgery.  I have answered all her  questions.  She has decided proceed with surgery.   Ophelia Charter 01/25/2022 7:21 AM

## 2022-01-25 NOTE — Transfer of Care (Signed)
Immediate Anesthesia Transfer of Care Note  Patient: Molly Ferrell  Procedure(s) Performed: Posterior Lumbar Interbody Fusion ,Instrumented Prothesis,Posterior  Instrumentation, Lumbar two-three;Explore previous Fusion  Patient Location: PACU  Anesthesia Type:General  Level of Consciousness: drowsy and patient cooperative  Airway & Oxygen Therapy: Patient Spontanous Breathing  Post-op Assessment: Report given to RN and Post -op Vital signs reviewed and stable  Post vital signs: Reviewed and stable  Last Vitals:  Vitals Value Taken Time  BP    Temp    Pulse    Resp    SpO2      Last Pain:  Vitals:   01/25/22 0649  PainSc: 2       Patients Stated Pain Goal: 0 (67/34/19 3790)  Complications: No notable events documented.

## 2022-01-25 NOTE — Progress Notes (Signed)
Pharmacy Antibiotic Note  Molly Ferrell is a 71 y.o. female admitted on 01/25/2022 with surgical prophylaxis .  Pharmacy has been consulted for vancomycin dosing.  No drain per RN.  Received vancomycin '1000mg'$  at 8am. Last Scr 0.8 on 09/09/21; ClCr > 70 ml/min based on this Scr.   Plan: Vancomycin '500mg'$  x1   Height: 5' (152.4 cm) Weight: 64 kg (141 lb) IBW/kg (Calculated) : 45.5  Temp (24hrs), Avg:98 F (36.7 C), Min:97.5 F (36.4 C), Max:98.4 F (36.9 C)  No results for input(s): "WBC", "CREATININE", "LATICACIDVEN", "VANCOTROUGH", "VANCOPEAK", "VANCORANDOM", "GENTTROUGH", "GENTPEAK", "GENTRANDOM", "TOBRATROUGH", "TOBRAPEAK", "TOBRARND", "AMIKACINPEAK", "AMIKACINTROU", "AMIKACIN" in the last 168 hours.  CrCl cannot be calculated (Patient's most recent lab result is older than the maximum 21 days allowed.).    Allergies  Allergen Reactions   Penicillins Hives and Itching    CHILDHOOD reaction to Pen G Has patient had a PCN reaction causing immediate rash, facial/tongue/throat swelling, SOB or lightheadedness with hypotension: No Has patient had a PCN reaction causing severe rash involving mucus membranes or skin necrosis: No Has patient had a PCN reaction that required hospitalization: No Has patient had a PCN reaction occurring within the last 10 years: No If all of the above answers are "NO", then may proceed with Cephalosporin use.      Benetta Spar, PharmD, BCPS, BCCP Clinical Pharmacist  Please check AMION for all [facility] phone numbers After 10:00 PM, call [facility] [phone]

## 2022-01-26 MED ORDER — OXYCODONE-ACETAMINOPHEN 5-325 MG PO TABS: 1.0000 | ORAL_TABLET | ORAL | Status: DC | PRN

## 2022-01-26 MED ORDER — DOCUSATE SODIUM 100 MG PO CAPS: 100.0000 mg | ORAL_CAPSULE | Freq: Two times a day (BID) | ORAL | 0 refills | Status: AC

## 2022-01-26 MED ORDER — CYCLOBENZAPRINE HCL 5 MG PO TABS: 5.0000 mg | ORAL_TABLET | Freq: Three times a day (TID) | ORAL | 0 refills | Status: DC | PRN

## 2022-01-26 MED ORDER — CYCLOBENZAPRINE HCL 5 MG PO TABS: 5.0000 mg | ORAL_TABLET | Freq: Three times a day (TID) | ORAL | Status: DC | PRN

## 2022-01-26 MED ORDER — OXYCODONE-ACETAMINOPHEN 5-325 MG PO TABS: 1.0000 | ORAL_TABLET | ORAL | 0 refills | Status: DC | PRN

## 2022-01-26 NOTE — Discharge Summary (Signed)
Physician Discharge Summary  Patient ID: Molly Ferrell MRN: [id number] DOB/AGE: December 04, 1950 71 y.o.  Admit date: 01/25/2022 Discharge date: 01/26/2022  Admission Diagnoses: Lumbar adjacent disease with spondylolisthesis, lumbar spinal stenosis, neurogenic claudication, lumbar radiculopathy, neurogenic claudication  Discharge Diagnoses: The same Principal Problem:   Lumbar adjacent segment disease with spondylolisthesis   Discharged Condition: good  Hospital Course: I performed an L2-3 decompression, instrumentation and fusion on the patient on 01/25/2022.  The surgery went well.  The patient's postoperative course was unremarkable.  On postoperative day 1 she did was doing well and requested discharge home.  She was given verbal and written discharge instructions.  All her questions were answered.  Consults: PT, OT, care management Significant Diagnostic Studies: None Treatments: Exploration of lumbar fusion/removal of lumbar hardware, L2-3 decompression, instrumentation and fusion. Discharge Exam: Blood pressure 116/74, pulse 83, temperature 98.2 F (36.8 C), temperature source Oral, resp. rate 18, height 5' (1.524 m), weight 64 kg, SpO2 96 %. The patient is alert and pleasant.  She looks well.  Her dressing is clean and dry.  Her strength is normal.  Disposition: Home  Discharge Instructions     Call MD for:  difficulty breathing, headache or visual disturbances   Complete by: As directed    Call MD for:  extreme fatigue   Complete by: As directed    Call MD for:  hives   Complete by: As directed    Call MD for:  persistant dizziness or light-headedness   Complete by: As directed    Call MD for:  persistant nausea and vomiting   Complete by: As directed    Call MD for:  redness, tenderness, or signs of infection (pain, swelling, redness, odor or green/yellow discharge around incision site)   Complete by: As directed    Call MD for:  severe uncontrolled pain    Complete by: As directed    Call MD for:  temperature >100.4   Complete by: As directed    Diet - low sodium heart healthy   Complete by: As directed    Discharge instructions   Complete by: As directed    Call [phone] for a followup appointment. Take a stool softener while you are using pain medications.   Driving Restrictions   Complete by: As directed    Do not drive for 2 weeks.   Increase activity slowly   Complete by: As directed    Lifting restrictions   Complete by: As directed    Do not lift more than 5 pounds. No excessive bending or twisting.   May shower / Bathe   Complete by: As directed    Remove the dressing for 3 days after surgery.  You may shower, but leave the incision alone.   Remove dressing in 48 hours   Complete by: As directed       Allergies as of 01/26/2022       Reactions   Penicillins Hives, Itching   CHILDHOOD reaction to Pen G Has patient had a PCN reaction causing immediate rash, facial/tongue/throat swelling, SOB or lightheadedness with hypotension: No Has patient had a PCN reaction causing severe rash involving mucus membranes or skin necrosis: No Has patient had a PCN reaction that required hospitalization: No Has patient had a PCN reaction occurring within the last 10 years: No If all of the above answers are "NO", then may proceed with Cephalosporin use.        Medication List     STOP taking these  medications    acetaminophen 500 MG tablet Commonly known as: TYLENOL   celecoxib 200 MG capsule Commonly known as: CELEBREX       TAKE these medications    aspirin EC 81 MG tablet Take 81 mg by mouth 2 (two) times daily. Swallow whole.   atorvastatin 10 MG tablet Commonly known as: LIPITOR Take 10 mg by mouth daily.   cholecalciferol 25 MCG (1000 UNIT) tablet Commonly known as: VITAMIN D3 Take 1,000 Units by mouth in the morning and at bedtime.   cyclobenzaprine 5 MG tablet Commonly known as: FLEXERIL Take 1  tablet (5 mg total) by mouth 3 (three) times daily as needed for muscle spasms.   dicyclomine 10 MG capsule Commonly known as: BENTYL Take 10 mg by mouth every 6 (six) hours as needed for spasms.   docusate sodium 100 MG capsule Commonly known as: COLACE Take 1 capsule (100 mg total) by mouth 2 (two) times daily.   fish oil-omega-3 fatty acids 1000 MG capsule Take 1 g by mouth daily.   Lutein 20 MG Tabs Take 20 mg by mouth 2 (two) times daily.   MAGNESIUM GLYCINATE PO Take 350 mg by mouth at bedtime.   melatonin 5 MG Tabs Take 5 mg by mouth at bedtime.   niacin 500 MG tablet Commonly known as: VITAMIN B3 Take 500 mg by mouth 2 (two) times daily.   omeprazole 40 MG capsule Commonly known as: PRILOSEC Take 40 mg by mouth daily.   oxyCODONE-acetaminophen 5-325 MG tablet Commonly known as: PERCOCET/ROXICET Take 1-2 tablets by mouth every 4 (four) hours as needed for moderate pain.   raloxifene 60 MG tablet Commonly known as: EVISTA Take 1 tablet (60 mg total) by mouth daily.   Testosterone Powd Apply 1 Dose topically daily. Testosterone Cream 2% - APPLY 6.5YY (2 CLICKS) TO LABIA ONCE A DAY.   thyroid 60 MG tablet Commonly known as: ARMOUR Take 60 mg by mouth daily before breakfast.   vitamin C 1000 MG tablet Take 1,000 mg by mouth daily.   vitamin E 180 MG (400 UNITS) capsule Take 400 Units by mouth daily.         Signed: Ophelia Charter 01/26/2022, 7:46 AM

## 2022-01-26 NOTE — Evaluation (Signed)
Occupational Therapy Evaluation Patient Details Name: Molly Ferrell MRN: [id number] DOB: 1951/01/13 Today's Date: 01/26/2022   History of Present Illness Pt is a 71 y.o. female s/p PLIF 11/29. PMH: HOH, OA, s/p hysterectomy, h/o breast CA, h/o previous PLIF (2021), h/o L TKA (2017), h/o R TKA (2018)   Clinical Impression   Pt is typically mod I in ADL and mobility. Able to recall 3/3 back precautions from PT session. Overall supervision for ADL. Focused on education for compensatory strategies to maintain back precautions during ADL/IADL. Pt able to verbalize and demonstrate understanding, UB and LB dressing, don/doff brace, transfers, sink level grooming. At this time education complete, Pt with no further questions or concerns, OT will sign off at this time.       Recommendations for follow up therapy are one component of a multi-disciplinary discharge planning process, led by the attending physician.  Recommendations may be updated based on patient status, additional functional criteria and insurance authorization.   Follow Up Recommendations  No OT follow up     Assistance Recommended at Discharge PRN  Patient can return home with the following Assistance with cooking/housework;Assist for transportation    Functional Status Assessment  Patient has had a recent decline in their functional status and demonstrates the ability to make significant improvements in function in a reasonable and predictable amount of time.  Equipment Recommendations  None recommended by OT    Recommendations for Other Services       Precautions / Restrictions Precautions Precautions: Back Precaution Booklet Issued: Yes (comment) Precaution Comments: Educated on 3/3 back precautions. Handout provided. Required Braces or Orthoses: Spinal Brace Spinal Brace: Lumbar corset;Applied in sitting position Restrictions Weight Bearing Restrictions: No Other Position/Activity Restrictions: no brace ok  for: bathroom, bathing      Mobility Bed Mobility Overal bed mobility: Modified Independent             General bed mobility comments: flat bed, good log roll, used rail (has beside table at home)    Transfers Overall transfer level: Needs assistance Equipment used: Ambulation equipment used Transfers: Sit to/from Stand Sit to Stand: Supervision           General transfer comment: supervision for safety      Balance Overall balance assessment: Mild deficits observed, not formally tested                                         ADL either performed or assessed with clinical judgement   ADL Overall ADL's : Needs assistance/impaired Eating/Feeding: Independent   Grooming: Supervision/safety;Cueing for compensatory techniques Grooming Details (indicate cue type and reason): reviewed compensatory strategies for grooming tasks to maintain back precautions Upper Body Bathing: Modified independent   Lower Body Bathing: Modified independent;Sitting/lateral leans Lower Body Bathing Details (indicate cue type and reason): has a shower chair Upper Body Dressing : Modified independent   Lower Body Dressing: Modified independent Lower Body Dressing Details (indicate cue type and reason): figure 4 method Toilet Transfer: Supervision/safety   Toileting- Clothing Manipulation and Hygiene: Supervision/safety       Functional mobility during ADLs: Modified independent;Supervision/safety       Vision Patient Visual Report: No change from baseline       Perception     Praxis      Pertinent Vitals/Pain Pain Assessment Faces Pain Scale: Hurts little more Pain Location: back Pain Descriptors /  Indicators: Discomfort, Operative site guarding     Hand Dominance Right   Extremity/Trunk Assessment Upper Extremity Assessment Upper Extremity Assessment: Overall WFL for tasks assessed   Lower Extremity Assessment Lower Extremity Assessment: Defer to PT  evaluation   Cervical / Trunk Assessment Cervical / Trunk Assessment: Back Surgery   Communication Communication Communication: HOH   Cognition Arousal/Alertness: Awake/alert Behavior During Therapy: WFL for tasks assessed/performed Overall Cognitive Status: Within Functional Limits for tasks assessed                                       General Comments  good recall of back precautions from PT session    Exercises     Shoulder Instructions      Home Living Family/patient expects to be discharged to:: Private residence Living Arrangements: Spouse/significant other Available Help at Discharge: Family;Available 24 hours/day Type of Home: House Home Access: Level entry     Home Layout: Two level Alternate Level Stairs-Number of Steps: 3 steps to sunken den Alternate Level Stairs-Rails: Right Bathroom Shower/Tub: Occupational psychologist: Standard     Home Equipment: Cane - single Barista (2 wheels);BSC/3in1          Prior Functioning/Environment Prior Level of Function : Independent/Modified Independent                        OT Problem List: Decreased activity tolerance;Impaired balance (sitting and/or standing);Pain      OT Treatment/Interventions:      OT Goals(Current goals can be found in the care plan section) Acute Rehab OT Goals Patient Stated Goal: decrease pain in R leg OT Goal Formulation: With patient Time For Goal Achievement: 02/09/22 Potential to Achieve Goals: Good  OT Frequency:      Co-evaluation              AM-PAC OT "6 Clicks" Daily Activity     Outcome Measure Help from another person eating meals?: None Help from another person taking care of personal grooming?: None Help from another person toileting, which includes using toliet, bedpan, or urinal?: None Help from another person bathing (including washing, rinsing, drying)?: None Help from another person to put on and taking off  regular upper body clothing?: None Help from another person to put on and taking off regular lower body clothing?: None 6 Click Score: 24   End of Session Equipment Utilized During Treatment: Gait belt Nurse Communication: Mobility status  Activity Tolerance: Patient tolerated treatment well Patient left: in chair;with call bell/phone within reach  OT Visit Diagnosis: Other abnormalities of gait and mobility (R26.89);Muscle weakness (generalized) (M62.81)                Time: 7035-0093 OT Time Calculation (min): 28 min Charges:  OT General Charges $OT Visit: 1 Visit OT Evaluation $OT Eval Low Complexity: 1 Low OT Treatments $Self Care/Home Management : 8-22 mins  Jesse Sans OTR/L Acute Rehabilitation Services Office: [place] 01/26/2022, 9:00 AM

## 2022-01-26 NOTE — Evaluation (Signed)
Physical Therapy Evaluation Patient Details Name: Molly Ferrell MRN: [id number] DOB: 1950/11/13 Today's Date: 01/26/2022  History of Present Illness  Pt is a 71 y.o. female s/p PLIF 11/29. PMH: HOH, OA, s/p hysterectomy, h/o breast CA, h/o previous PLIF (2021), h/o L TKA (2017), h/o R TKA (2018)   Clinical Impression  PT eval complete. Pt demonstrates mod I bed mobility. Supervision for transfers and ambulation 350' with RW. Min guard assist ascend/descend 3 steps with R rail. Pt presents with shuffle gait pattern requiring cues for bilat foot clearance and improved step length. Educated on 3/3 back precautions. Plan is for d/c home today.        Recommendations for follow up therapy are one component of a multi-disciplinary discharge planning process, led by the attending physician.  Recommendations may be updated based on patient status, additional functional criteria and insurance authorization.  Follow Up Recommendations Follow physician's recommendations for discharge plan and follow up therapies      Assistance Recommended at Discharge PRN  Patient can return home with the following       Equipment Recommendations None recommended by PT  Recommendations for Other Services       Functional Status Assessment Patient has had a recent decline in their functional status and demonstrates the ability to make significant improvements in function in a reasonable and predictable amount of time.     Precautions / Restrictions Precautions Precautions: Back Precaution Booklet Issued: Yes (comment) Precaution Comments: Educated on 3/3 back precautions. Handout provided. Required Braces or Orthoses: Spinal Brace Spinal Brace: Lumbar corset;Applied in sitting position Restrictions Weight Bearing Restrictions: No      Mobility  Bed Mobility Overal bed mobility: Modified Independent             General bed mobility comments: +rail, HOB elevated    Transfers Overall  transfer level: Needs assistance Equipment used: Ambulation equipment used Transfers: Sit to/from Stand Sit to Stand: Supervision           General transfer comment: supervision for safety    Ambulation/Gait Ambulation/Gait assistance: Supervision Gait Distance (Feet): 350 Feet Assistive device: Rolling walker (2 wheels) Gait Pattern/deviations: Shuffle Gait velocity: decreased Gait velocity interpretation: <1.31 ft/sec, indicative of household ambulator   General Gait Details: cues for foot clearance bilat  Stairs Stairs: Yes Stairs assistance: Min guard Stair Management: One rail Right, Sideways, Step to pattern Number of Stairs: 3    Wheelchair Mobility    Modified Rankin (Stroke Patients Only)       Balance Overall balance assessment: Mild deficits observed, not formally tested                                           Pertinent Vitals/Pain Pain Assessment Pain Assessment: Faces Faces Pain Scale: Hurts little more Pain Location: back Pain Descriptors / Indicators: Discomfort, Operative site guarding Pain Intervention(s): Monitored during session    Home Living Family/patient expects to be discharged to:: Private residence Living Arrangements: Spouse/significant other Available Help at Discharge: Family;Available 24 hours/day Type of Home: House Home Access: Level entry     Alternate Level Stairs-Number of Steps: 3 steps to sunken den Home Layout: Two level Home Equipment: Cane - single Barista (2 wheels);BSC/3in1      Prior Function Prior Level of Function : Independent/Modified Independent  Hand Dominance        Extremity/Trunk Assessment   Upper Extremity Assessment Upper Extremity Assessment: Defer to OT evaluation    Lower Extremity Assessment Lower Extremity Assessment: Generalized weakness    Cervical / Trunk Assessment Cervical / Trunk Assessment: Back Surgery   Communication   Communication: HOH  Cognition Arousal/Alertness: Awake/alert Behavior During Therapy: WFL for tasks assessed/performed Overall Cognitive Status: Within Functional Limits for tasks assessed                                          General Comments      Exercises     Assessment/Plan    PT Assessment Patient does not need any further PT services  PT Problem List         PT Treatment Interventions      PT Goals (Current goals can be found in the Care Plan section)  Acute Rehab PT Goals Patient Stated Goal: home today PT Goal Formulation: All assessment and education complete, DC therapy    Frequency       Co-evaluation               AM-PAC PT "6 Clicks" Mobility  Outcome Measure Help needed turning from your back to your side while in a flat bed without using bedrails?: None Help needed moving from lying on your back to sitting on the side of a flat bed without using bedrails?: A Little Help needed moving to and from a bed to a chair (including a wheelchair)?: A Little Help needed standing up from a chair using your arms (e.g., wheelchair or bedside chair)?: A Little Help needed to walk in hospital room?: A Little Help needed climbing 3-5 steps with a railing? : A Little 6 Click Score: 19    End of Session Equipment Utilized During Treatment: Gait belt;Back brace Activity Tolerance: Patient tolerated treatment well Patient left: in bed;with call bell/phone within reach Nurse Communication: Mobility status PT Visit Diagnosis: Difficulty in walking, not elsewhere classified (R26.2)    Time: 7035-0093 PT Time Calculation (min) (ACUTE ONLY): 20 min   Charges:   PT Evaluation $PT Eval Moderate Complexity: 1 Mod          Lorrin Goodell, PT  Office # [phone] Pager [phone]   Molly Ferrell 01/26/2022, 8:27 AM

## 2022-01-26 NOTE — Plan of Care (Signed)
Patient alert and oriented, void, VSS surgical site clean and dry no sign of infection. D/c instructions explain and given to the patient, all questions answered. D/c patient home per order Problem: Education: Goal: Ability to verbalize activity precautions or restrictions will improve Outcome: Completed/Met Goal: Knowledge of the prescribed therapeutic regimen will improve Outcome: Completed/Met Goal: Understanding of discharge needs will improve Outcome: Completed/Met

## 2022-02-03 MED FILL — Electrolyte-R (PH 7.4) Solution: INTRAVENOUS | Qty: 2000 | Status: AC

## 2022-02-03 MED FILL — Heparin Sodium (Porcine) Inj 1000 Unit/ML: INTRAMUSCULAR | Qty: 30 | Status: AC

## 2022-03-09 NOTE — Progress Notes (Signed)
[facility] Date:03/14/22    Medical oncology follow-up visit     Subjective  Patient ID: Molly Ferrell, female    DOB: 23-Sep-1950, 72 y.o.   MRN: [id number]   Chief complaint: Breast cancer     HPI   Molly Ferrell is here today to discuss her breast cancer that was found on a screening mammogram by calcifications of the right breast. She regularly gets them every year. She went on to have a diagnostic mammogram, which showed suspicious calcifications in a linear distribution spanning 2.5 cm in the medial right breast. She  had a biopsy on Jul 08, 2021 which revealed DCIS. On August 08, 2021, she had lumpectomy by Dr.Moorhead, and he went back for further excisions to be sure of clear margins.She is healing well, and just feels sore. She has seen Dr.Palermo and has been scheduled for radiation. The final pathology revealed an 8 mm grade 3 ductal carcinoma in situ with micro-calcifications and necrosis.  All margins were clear. Estrogen receptors were positive at 80% and progesterone were also positive at 80%.  The patient is still on hormone replacement therapy with estradiol 2 mg daily, progesterone at night and testosterone cream. She was started on this since her hysterectomy at age 60. She had her first period at 72 years old. She had a hysterectomy at 72 years old for endometriosis but she has half an ovary remaining. She is nulliparous.     INTERVAL HISTORY Molly Ferrell is here today to follow up regarding her DCIS.  She states she had back surgery in November. She states she has been doing well since just slightly sore but overall healing well. She will be seeing Dr.Moorhead and will get her mammogram in March. Her appetite is good and her weight is up a 4 pounds since her last visit.  She denies fevers chills or night sweats.  She denies any cardiorespiratory or gastrointestinal complaints.        Past Medical History:  Diagnosis Date   Arthritis      Complication of anesthesia      "many years ago, had problems waking up"; patient stated no problems with recent surgeries 01/21/2018   GERD (gastroesophageal reflux disease)     Hard of hearing     History of bronchitis     Hypercholesteremia     Hypertriglyceridemia     Hypothyroidism     Macular degeneration of both eyes     OA (osteoarthritis)      severe right knee   Spondylolisthesis of lumbar region     Wears glasses             Past Surgical History:  Procedure Laterality Date   ABDOMINAL HYSTERECTOMY       APPENDECTOMY       BACK SURGERY        lumbar   CATARACT EXTRACTION W/ INTRAOCULAR LENS IMPLANT Bilateral     COLONOSCOPY       ESOPHAGOGASTRODUODENOSCOPY       JOINT REPLACEMENT       KNEE ARTHROPLASTY Left     TONSILLECTOMY       TOTAL KNEE ARTHROPLASTY Right 06/12/2016    Procedure: TOTAL KNEE ARTHROPLASTY;  Surgeon: Vickey Huger, MD;  Location: [facility];  Service: Orthopedics;  Laterality: Right;   TOTAL KNEE REVISION Left 06/14/2015    Procedure: TOTAL KNEE REVISION POLY EXCHANGE;  Surgeon: Vickey Huger, MD;  Location: [facility];  Service:  Orthopedics;  Laterality: Left;  Has had surgery L3 and L4 on back        Family History  Problem Relation Age of Onset   Heart disease Mother     Heart disease Father    Fathers side (all sisters) had breast cancer     SOCIAL HISTORY She Is married of 37 years She is a former smoker but quit 15 years ago She drinks alcohol occasionally (weekend)   Social History         Socioeconomic History   Marital status: Married      Spouse name: Not on file   Number of children: Not on file   Years of education: Not on file   Highest education level: Not on file  Occupational History   Not on file  Tobacco Use   Smoking status: Former   Smokeless tobacco: Never   Tobacco comments:      "quit 10-15 years ago" 06/03/15  Vaping Use   Vaping Use: Never used  Substance and Sexual Activity   Alcohol use: Yes      Alcohol/week:  1.0 standard drink of alcohol      Types: 1 Glasses of wine per week      Comment: weekends   Drug use: No   Sexual activity: Not on file  Other Topics Concern   Not on file  Social History Narrative   Not on file    Social Determinants of Health    Financial Resource Strain: Not on file  Food Insecurity: Not on file  Transportation Needs: Not on file  Physical Activity: Not on file  Stress: Not on file  Social Connections: Not on file  Intimate Partner Violence: Not on file            Outpatient Medications Prior to Visit  Medication Sig Dispense Refill   Ascorbic Acid (VITAMIN C) 1000 MG tablet Take 1,000 mg by mouth daily.       aspirin EC 81 MG tablet Take 81 mg by mouth 2 (two) times daily.        atorvastatin (LIPITOR) 10 MG tablet Take 10 mg by mouth daily.       Cholecalciferol (VITAMIN D) 2000 units CAPS Take 2,000 Units by mouth daily.       cyclobenzaprine (FLEXERIL) 10 MG tablet Take 1 tablet (10 mg total) by mouth 3 (three) times daily as needed for muscle spasms. 50 tablet 0   docusate sodium (COLACE) 100 MG capsule Take 1 capsule (100 mg total) by mouth 2 (two) times daily. 60 capsule 0   estradiol (ESTRACE) 2 MG tablet Take 2 mg by mouth daily.       Lutein 40 MG CAPS Take 40 mg by mouth 2 (two) times daily. MORNING & AFTERNOON       MAGNESIUM GLYCINATE PO Take 400 mg by mouth at bedtime.       Melatonin 5 MG TABS Take 5 mg by mouth at bedtime.       niacin 500 MG tablet Take 500 mg by mouth 2 (two) times daily.        Omega-3 Fatty Acids (FISH OIL PO) Take 1,280 mg by mouth 2 (two) times daily.        omeprazole (PRILOSEC) 40 MG capsule Take 40 mg by mouth daily.       OVER THE COUNTER MEDICATION Take 1 capsule by mouth daily. DIM Enhanced Delivery System       oxyCODONE (OXY IR/ROXICODONE) 5  MG immediate release tablet Take 1 tablet (5 mg total) by mouth every 4 (four) hours as needed for moderate pain ((score 4 to 6)). 30 tablet 0   progesterone (PROMETRIUM)  200 MG capsule Take 400 mg by mouth at bedtime. Compounded at [facility] in [place].   1   Propylene Glycol (SYSTANE COMPLETE) 0.6 % SOLN Place 1 drop into both eyes daily.       Testosterone POWD Apply 1 application topically at bedtime. Testosterone Cream 0.25%       thyroid (ARMOUR) 60 MG tablet Take 60 mg by mouth daily before breakfast.       vitamin E 400 UNIT capsule Take 400 Units by mouth daily.        No facility-administered medications prior to visit.           Allergies  Allergen Reactions   Penicillins Hives and Itching      CHILDHOOD reaction to Pen G Has patient had a PCN reaction causing immediate rash, facial/tongue/throat swelling, SOB or lightheadedness with hypotension: No Has patient had a PCN reaction causing severe rash involving mucus membranes or skin necrosis: No Has patient had a PCN reaction that required hospitalization: No Has patient had a PCN reaction occurring within the last 10 years: No If all of the above answers are "NO", then may proceed with Cephalosporin use.            Review of Systems  Constitutional: Negative.   HENT: Negative.    Eyes: Negative.   Respiratory: Negative.    Cardiovascular: Negative.   Gastrointestinal: Negative.   Genitourinary: Negative.   Musculoskeletal: Negative.   Skin: Negative.   Neurological: Negative.   Endo/Heme/Allergies: Negative.   Psychiatric/Behavioral: Negative.            Objective:   Physical Exam Constitutional:      General: She is not in acute distress.    Appearance: Normal appearance. She is normal weight. She is not ill-appearing or toxic-appearing.  HENT:     Right Ear: Tympanic membrane normal. There is no impacted cerumen.     Left Ear: Tympanic membrane normal. There is no impacted cerumen.     Nose: Nose normal.  Eyes:     General: No scleral icterus.    Extraocular Movements: Extraocular movements intact.     Conjunctiva/sclera: Conjunctivae normal.     Pupils: Pupils are  equal, round, and reactive to light.  Cardiovascular:     Rate and Rhythm: Normal rate and regular rhythm.     Pulses: Normal pulses.     Heart sounds: Normal heart sounds. No murmur heard.    No gallop.  Pulmonary:     Effort: Pulmonary effort is normal. No respiratory distress.     Breath sounds: Normal breath sounds. No wheezing or rales.     Comments: Long incision of the lower back which is healing well. Chest:     Chest wall: No mass.  Breasts:    Right: No mass.     Left: No mass.     Comments: The right breast still has radiation and hyperpigmentation changes with subcutaneous edema especially in the nipple. The incision along the superior areola is well healed. No masses in either breast. Abdominal:     General: Bowel sounds are normal.  Musculoskeletal:        General: Normal range of motion.     Cervical back: Normal range of motion and neck supple.  Neurological:  Mental Status: She is alert and oriented to person, place, and time.  Psychiatric:        Mood and Affect: Mood normal.        Behavior: Behavior normal.        Thought Content: Thought content normal.        Judgment: Judgment normal.     Objective  Physical Exam Constitutional:      Appearance: Normal appearance. She is not ill-appearing or diaphoretic.  HENT:     Head: Normocephalic and atraumatic.     Right Ear: External ear normal.     Left Ear: External ear normal.     Nose: No congestion or rhinorrhea.  Eyes:     Extraocular Movements: Extraocular movements intact.     Pupils: Pupils are equal, round, and reactive to light.  Cardiovascular:     Rate and Rhythm: Normal rate and regular rhythm.     Pulses: Normal pulses.     Heart sounds: Normal heart sounds. No murmur heard.    No gallop.  Pulmonary:     Effort: Pulmonary effort is normal. No respiratory distress.     Breath sounds: No wheezing.     Comments: Chest:     Chest wall: No mass.  Breasts:    Right: No mass.     Left: No  mass.     Comments: Well healed scar in superior areolar complex on the right She has mild radiation changes of the right mid breast.   Musculoskeletal:     Cervical back: No tenderness.  Skin:    General: Skin is warm and dry.  Neurological:     Mental Status: She is alert and oriented to person, place, and time.  Psychiatric:        Judgment: Judgment normal.        There were no vitals taken for this visit.    Wt Readings from Last 3 Encounters:  03/03/19 137 lb (62.1 kg)  02/27/19 138 lb 14.4 oz (63 kg)  01/21/18 133 lb 4.8 oz (60.5 kg)      Diabetic Foot Exam - Simple   No data filed      Recent Labs       Lab Results  Component Value Date    WBC 16.7 (H) 03/04/2019    HGB 13.8 03/04/2019    HCT 40.3 03/04/2019    PLT 201 03/04/2019    GLUCOSE 115 (H) 03/04/2019    ALT 35 06/01/2016    AST 25 06/01/2016    NA 139 03/04/2019    K 4.2 03/04/2019    CL 106 03/04/2019    CREATININE 0.96 03/04/2019    BUN 11 03/04/2019    CO2 24 03/04/2019    INR 1.03 06/03/2015        Recent Labs  No results found for: "TSH"   Recent Labs       Lab Results  Component Value Date    WBC 16.7 (H) 03/04/2019    HGB 13.8 03/04/2019    HCT 40.3 03/04/2019    MCV 90.4 03/04/2019    PLT 201 03/04/2019      Recent Labs       Lab Results  Component Value Date    NA 139 03/04/2019    K 4.2 03/04/2019    CO2 24 03/04/2019    GLUCOSE 115 (H) 03/04/2019    BUN 11 03/04/2019    CREATININE 0.96 03/04/2019    BILITOT 0.7 06/01/2016  ALKPHOS 53 06/01/2016    AST 25 06/01/2016    ALT 35 06/01/2016    PROT 7.1 06/01/2016    ALBUMIN 3.9 06/01/2016    CALCIUM 8.9 03/04/2019    ANIONGAP 9 03/04/2019      Recent Labs  No results found for: "CHOL"   Recent Labs  No results found for: "HDL"   Recent Labs  No results found for: "LDLCALC"   Recent Labs  No results found for: "TRIG"   Recent Labs  No results found for: "CHOLHDL"   Recent Labs  No results found  for: "HGBA1C"         Assessment & Plan:    Ductal carcinoma in situ right breast She has had resection and has completed radiotherapy.  I reviewed the diagnosis and pathology report. I emphasized her excellent prognosis.  She has been weaned off her hormone replacement therapy.  I offered chemoprevention with raloxifene, and we reviewed the risks and benefits. She has declined.     Plan: Her bone density scan looks quite good and is essentially normal in the spine and forearm.  I will plan to see her back for follow up in 6 months for reexamination.  She is scheduled to get her annual mammogram in March, along with a follow up appointment with Dr.Moorhead then. Her questions were answered and she understand and agrees with this plan of care.  She knows to contact our office if she develops concerns prior to her next appointment.     Problem List Items Addressed This Visit              Other    Ductal carcinoma in situ (DCIS) of right breast - Primary           No orders of the defined types were placed in this encounter.          I,Gabriella Ballesteros,acting as a scribe for Derwood Kaplan, MD.,have documented all relevant documentation on the behalf of Derwood Kaplan, MD,as directed by  Derwood Kaplan, MD while in the presence of Derwood Kaplan, MD.

## 2022-03-14 ENCOUNTER — Inpatient Hospital Stay: Payer: PPO | Attending: Oncology | Admitting: Oncology

## 2022-03-14 ENCOUNTER — Telehealth: Payer: Self-pay | Admitting: Oncology

## 2022-03-14 ENCOUNTER — Encounter: Payer: Self-pay | Admitting: Oncology

## 2022-03-14 VITALS — BP 126/79 | HR 70 | Temp 98.0°F | Resp 16 | Ht 60.0 in | Wt 145.4 lb

## 2022-03-14 DIAGNOSIS — D0511 Intraductal carcinoma in situ of right breast: Secondary | ICD-10-CM | POA: Diagnosis not present

## 2022-03-14 NOTE — Telephone Encounter (Signed)
03/14/22 [facility] f/u appt scheduled and confirmed with patient

## 2022-07-04 NOTE — Progress Notes (Signed)
[facility] - Clinic Note  07/05/2022   CHIEF COMPLAINT Patient presents for Retina Evaluation  HISTORY OF PRESENT ILLNESS: Molly Ferrell is a 72 y.o. female who presents to the clinic today for:  HPI     Retina Evaluation   In right eye.  Associated Symptoms Floaters.  Negative for Flashes, Distortion and Blind Spot.  Context:  near vision.  I, the attending physician,  performed the HPI with the patient and updated documentation appropriately.        Comments   Patient is here today based on a referral from Dr. Zenaida Niece for converted AMD OD. She denies noticing any changes in her vision. She is using AT's OU PRN.      Last edited by Rennis Chris, MD on 07/05/2022  2:50 PM.    Pt here on the referral of Dr Zenaida Niece for conversion of non-exu ARMD to exu ARMD OD noted on 05.06.24 visit, pt states she went to see Dr. Zenaida Niece for a routine eye exam, the only complaint she has about her vision is it's getting harder for her to read, pt states she has known she has dry macular degeneration for awhile, she is on AREDS 2, pt states Dr. Zenaida Niece told her her right eye converted to wet macular degeneration  Referring physician: Diona Foley, MD [address] [place],  [place] [address]  HISTORICAL INFORMATION:  Selected notes from the MEDICAL RECORD NUMBER Referred by Dr. Zenaida Niece for concern of exu ARMD OD LEE:  Ocular Hx- PMH-   CURRENT MEDICATIONS: No current outpatient medications on file. (Ophthalmic Drugs)   No current facility-administered medications for this visit. (Ophthalmic Drugs)   Current Outpatient Medications (Other)  Medication Sig   Ascorbic Acid (VITAMIN C) 1000 MG tablet Take 1,000 mg by mouth daily.   aspirin EC 81 MG tablet Take 81 mg by mouth 2 (two) times daily. Swallow whole.   atorvastatin (LIPITOR) 10 MG tablet Take 10 mg by mouth daily.   cholecalciferol (VITAMIN D3) 25 MCG (1000 UNIT) tablet Take 1,000 Units by mouth in the morning and at bedtime.    cyclobenzaprine (FLEXERIL) 5 MG tablet Take 1 tablet (5 mg total) by mouth 3 (three) times daily as needed for muscle spasms.   dicyclomine (BENTYL) 10 MG capsule Take 10 mg by mouth every 6 (six) hours as needed for spasms.   docusate sodium (COLACE) 100 MG capsule Take 1 capsule (100 mg total) by mouth 2 (two) times daily.   fish oil-omega-3 fatty acids 1000 MG capsule Take 1 g by mouth daily.   Lutein 20 MG TABS Take 20 mg by mouth 2 (two) times daily.   MAGNESIUM GLYCINATE PO Take 350 mg by mouth at bedtime.   Melatonin 5 MG TABS Take 5 mg by mouth at bedtime.   niacin 500 MG tablet Take 500 mg by mouth 2 (two) times daily.    omeprazole (PRILOSEC) 40 MG capsule Take 40 mg by mouth daily.   oxyCODONE-acetaminophen (PERCOCET/ROXICET) 5-325 MG tablet Take 1-2 tablets by mouth every 4 (four) hours as needed for moderate pain.   PRESCRIPTION MEDICATION APPLY 0.5GM (2 CLICKS) TO LABIA ONCE A DAY.   raloxifene (EVISTA) 60 MG tablet Take 1 tablet (60 mg total) by mouth daily.   Testosterone POWD Apply 1 Dose topically daily. Testosterone Cream 2% - APPLY 0.5GM (2 CLICKS) TO LABIA ONCE A DAY.   thyroid (ARMOUR) 60 MG tablet Take 60 mg by mouth daily before breakfast.  vitamin E 400 UNIT capsule Take 400 Units by mouth daily.   No current facility-administered medications for this visit. (Other)   REVIEW OF SYSTEMS: ROS   Positive for: Musculoskeletal, Eyes Last edited by Julieanne Cotton, COT on 07/05/2022  1:11 PM.     ALLERGIES Allergies  Allergen Reactions   Penicillins Hives and Itching    CHILDHOOD reaction to Pen G Has patient had a PCN reaction causing immediate rash, facial/tongue/throat swelling, SOB or lightheadedness with hypotension: No Has patient had a PCN reaction causing severe rash involving mucus membranes or skin necrosis: No Has patient had a PCN reaction that required hospitalization: No Has patient had a PCN reaction occurring within the last 10 years: No If  all of the above answers are "NO", then may proceed with Cephalosporin use.      PAST MEDICAL HISTORY Past Medical History:  Diagnosis Date   Arthritis    Cancer (HCC) 07/2021   Right Breast Cancer-Stage 0; 1 month of radation per pt   Complication of anesthesia    "many years ago, had problems waking up"; patient stated no problems with recent surgeries 01/21/2018   GERD (gastroesophageal reflux disease)    Hard of hearing    History of bronchitis    Hypercholesteremia    Hypertriglyceridemia    Hypothyroidism    Macular degeneration of both eyes    OA (osteoarthritis)    severe right knee   Spondylolisthesis of lumbar region    Wears glasses    Past Surgical History:  Procedure Laterality Date   ABDOMINAL HYSTERECTOMY     APPENDECTOMY     BACK SURGERY     lumbar   BREAST LUMPECTOMY Right 07/2021   CATARACT EXTRACTION W/ INTRAOCULAR LENS IMPLANT Bilateral    COLONOSCOPY     ESOPHAGOGASTRODUODENOSCOPY     JOINT REPLACEMENT     bil hip replacements   KNEE ARTHROPLASTY Left    TONSILLECTOMY     TOTAL KNEE ARTHROPLASTY Right 06/12/2016   Procedure: TOTAL KNEE ARTHROPLASTY;  Surgeon: Dannielle Huh, MD;  Location: [facility] OR;  Service: Orthopedics;  Laterality: Right;   TOTAL KNEE REVISION Left 06/14/2015   Procedure: TOTAL KNEE REVISION POLY EXCHANGE;  Surgeon: Dannielle Huh, MD;  Location: [facility] OR;  Service: Orthopedics;  Laterality: Left;   FAMILY HISTORY Family History  Problem Relation Age of Onset   Heart disease Mother    Heart disease Father    SOCIAL HISTORY Social History   Tobacco Use   Smoking status: Former   Smokeless tobacco: Never   Tobacco comments:    "quit 10-15 years ago" 06/03/15  Vaping Use   Vaping Use: Never used  Substance Use Topics   Alcohol use: Yes    Alcohol/week: 2.0 standard drinks of alcohol    Types: 2 Glasses of wine per week    Comment: weekends   Drug use: No       OPHTHALMIC EXAM:  Base Eye Exam     Visual Acuity (Snellen -  Linear)       Right Left   Dist [place] 20/40 20/30 +2   Dist ph [place] NI NI         Tonometry (Tonopen, 1:15 PM)       Right Left   Pressure 13 11         Pupils       Dark Light Shape React APD   Right 3 3 Round Minimal None   Left 3 3 Round Minimal  None         Visual Fields       Left Right    Full Full         Extraocular Movement       Right Left    Full, Ortho Full, Ortho         Neuro/Psych     Oriented x3: Yes         Dilation     Both eyes: 1.0% Mydriacyl, 2.5% Phenylephrine @ 1:12 PM           Slit Lamp and Fundus Exam     Slit Lamp Exam       Right Left   Lids/Lashes Dermatochalasis - upper lid Dermatochalasis - upper lid   Conjunctiva/Sclera White and quiet White and quiet   Cornea mild arcus, well healed cataract wound mild arcus, well healed cataract wound   Anterior Chamber deep and clear deep and clear   Iris Round and dilated Round and dilated   Lens PC IOL in good position PC IOL in good position   Anterior Vitreous Vitreous syneresis Vitreous syneresis, mild asteroid hyalosis         Fundus Exam       Right Left   Disc Pink and Sharp Pink and Sharp, mild PPA   C/D Ratio 0.4 0.2   Macula Flat, Blunted foveal reflex, refractile drusen, +CNV with shallow SRF IT macula, no frank heme Flat, Blunted foveal reflex, refractile drusen, RPE mottling, clumping and early atrophy, No heme or edema   Vessels attenuated, mild tortuosity attenuated, mild tortuosity   Periphery Attached, No heme Attached, No heme           IMAGING AND PROCEDURES  Imaging and Procedures for 07/05/2022  OCT, Retina - OU - Both Eyes       Right Eye Quality was good. Central Foveal Thickness: 306. Progression has no prior data. Findings include abnormal foveal contour, subretinal hyper-reflective material, intraretinal fluid, pigment epithelial detachment, subretinal fluid (Shallow SRF overlying PEDs IT macula and fovea, Partial PVD).   Left  Eye Quality was good. Central Foveal Thickness: 234. Progression has no prior data. Findings include normal foveal contour, no IRF, no SRF, retinal drusen , pigment epithelial detachment, outer retinal atrophy (Scattered drusen with patchy ORA, partial PVD).   Notes *Images captured and stored on drive  Diagnosis / Impression:  OD: exu ARMD -- Shallow SRF overlying PEDs IT macula and fovea, Partial PVD OS: non-exu ARMD -- Scattered drusen with patchy ORA, partial PVD  Clinical management:  See below  Abbreviations: NFP - Normal foveal profile. CME - cystoid macular edema. PED - pigment epithelial detachment. IRF - intraretinal fluid. SRF - subretinal fluid. EZ - ellipsoid zone. ERM - epiretinal membrane. ORA - outer retinal atrophy. ORT - outer retinal tubulation. SRHM - subretinal hyper-reflective material. IRHM - intraretinal hyper-reflective material      Intravitreal Injection, Pharmacologic Agent - OD - Right Eye       Time Out 07/05/2022. 1:55 PM. Confirmed correct patient, procedure, site, and patient consented.   Anesthesia Topical anesthesia was used. Anesthetic medications included Lidocaine 2%, Proparacaine 0.5%.   Procedure Preparation included 5% betadine to ocular surface, eyelid speculum. A supplied needle was used.   Injection: 1.25 mg Bevacizumab 1.25mg /0.36ml   Route: Intravitreal, Site: Right Eye   NDC: [id number], Lot: [id number], Expiration date: 08/19/2022   Post-op Post injection exam found visual acuity of at least counting fingers. The patient tolerated the procedure  well. There were no complications. The patient received written and verbal post procedure care education.           ASSESSMENT/PLAN:   ICD-10-CM   1. Exudative age-related macular degeneration of right eye with active choroidal neovascularization (HCC)  H35.3211 OCT, Retina - OU - Both Eyes    Intravitreal Injection, Pharmacologic Agent - OD - Right Eye    Bevacizumab (AVASTIN) SOLN 1.25  mg    2. Intermediate stage nonexudative age-related macular degeneration of left eye  H35.3122     3. Asteroid hyalosis of left eye  H43.22     4. Pseudophakia, both eyes  Z96.1      Exudative age related macular degeneration, OD  - The incidence pathology and anatomy of wet AMD discussed   - discussed treatment options including observation vs intravitreal anti-VEGF agents such as Avastin, Lucentis, Eylea.    - Risks of endophthalmitis and vascular occlusive events and atrophic changes discussed with patient  - OCT shows Shallow SRF overlying PEDs IT macula and fovea, Partial PVD  - recommend IVA OD #1 today, 05.08.24 - pt wishes to proceed with injection - RBA of procedure discussed, questions answered - informed consent obtained and signed - see procedure note  - f/u in 4 wks -- DFE/OCT, possible injection  2. Age related macular degeneration, non-exudative, left eye  - The incidence, anatomy, and pathology of dry AMD, risk of progression, and the AREDS and AREDS 2 study including smoking risks discussed with patient.  - Recommend amsler grid monitoring  - f/u 3 months, DFE, OCT  3. Asteroid hyalosis OS  4. Pseudophakia OU  - s/p CE/IOL OU (CEA, [place], [place])  - IOL in good position, doing well  - monitor  Ophthalmic Meds Ordered this visit:  Meds ordered this encounter  Medications   Bevacizumab (AVASTIN) SOLN 1.25 mg     Return in about 4 weeks (around 08/02/2022) for f/u exu ARMD OD, DFE, OCT.  There are no Patient Instructions on file for this visit.  Explained the diagnoses, plan, and follow up with the patient and they expressed understanding.  Patient expressed understanding of the importance of proper follow up care.   This document serves as a record of services personally performed by Karie Chimera, MD, PhD. It was created on their behalf by De Blanch, an ophthalmic technician. The creation of this record is the provider's dictation and/or activities  during the visit.    Electronically signed by: De Blanch, OA, 07/09/22  2:15 AM  This document serves as a record of services personally performed by Karie Chimera, MD, PhD. It was created on their behalf by Glee Arvin. Manson Passey, OA an ophthalmic technician. The creation of this record is the provider's dictation and/or activities during the visit.    Electronically signed by: Glee Arvin. Manson Passey, [place] 05.08.2024 2:15 AM  Karie Chimera, M.D., Ph.D. Diseases & Surgery of the Retina and Vitreous [facility] 07/05/2022  I have reviewed the above documentation for accuracy and completeness, and I agree with the above. Karie Chimera, M.D., Ph.D. 07/09/22 2:17 AM  Abbreviations: M myopia (nearsighted); A astigmatism; H hyperopia (farsighted); P presbyopia; Mrx spectacle prescription;  CTL contact lenses; OD right eye; OS left eye; OU both eyes  XT exotropia; ET esotropia; PEK punctate epithelial keratitis; PEE punctate epithelial erosions; DES dry eye syndrome; MGD meibomian gland dysfunction; ATs artificial tears; PFAT's preservative free artificial tears; NSC nuclear sclerotic cataract; PSC posterior  subcapsular cataract; ERM epi-retinal membrane; PVD posterior vitreous detachment; RD retinal detachment; DM diabetes mellitus; DR diabetic retinopathy; NPDR non-proliferative diabetic retinopathy; PDR proliferative diabetic retinopathy; CSME clinically significant macular edema; DME diabetic macular edema; dbh dot blot hemorrhages; CWS cotton wool spot; POAG primary open angle glaucoma; C/D cup-to-disc ratio; HVF humphrey visual field; GVF goldmann visual field; OCT optical coherence tomography; IOP intraocular pressure; BRVO Branch retinal vein occlusion; CRVO central retinal vein occlusion; CRAO central retinal artery occlusion; BRAO branch retinal artery occlusion; RT retinal tear; SB scleral buckle; PPV pars plana vitrectomy; VH Vitreous hemorrhage; PRP panretinal laser  photocoagulation; IVK intravitreal kenalog; VMT vitreomacular traction; MH Macular hole;  NVD neovascularization of the disc; NVE neovascularization elsewhere; AREDS age related eye disease study; ARMD age related macular degeneration; POAG primary open angle glaucoma; EBMD epithelial/anterior basement membrane dystrophy; ACIOL anterior chamber intraocular lens; IOL intraocular lens; PCIOL posterior chamber intraocular lens; Phaco/IOL phacoemulsification with intraocular lens placement; PRK photorefractive keratectomy; LASIK laser assisted in situ keratomileusis; HTN hypertension; DM diabetes mellitus; COPD chronic obstructive pulmonary disease

## 2022-07-05 ENCOUNTER — Ambulatory Visit (INDEPENDENT_AMBULATORY_CARE_PROVIDER_SITE_OTHER): Payer: PPO | Admitting: Ophthalmology

## 2022-07-05 ENCOUNTER — Encounter (INDEPENDENT_AMBULATORY_CARE_PROVIDER_SITE_OTHER): Payer: Self-pay | Admitting: Ophthalmology

## 2022-07-05 DIAGNOSIS — H353211 Exudative age-related macular degeneration, right eye, with active choroidal neovascularization: Secondary | ICD-10-CM

## 2022-07-05 DIAGNOSIS — H4322 Crystalline deposits in vitreous body, left eye: Secondary | ICD-10-CM

## 2022-07-05 DIAGNOSIS — Z961 Presence of intraocular lens: Secondary | ICD-10-CM | POA: Diagnosis not present

## 2022-07-05 DIAGNOSIS — H353122 Nonexudative age-related macular degeneration, left eye, intermediate dry stage: Secondary | ICD-10-CM | POA: Diagnosis not present

## 2022-07-05 DIAGNOSIS — H3581 Retinal edema: Secondary | ICD-10-CM

## 2022-07-05 MED ORDER — BEVACIZUMAB CHEMO INJECTION 1.25MG/0.05ML SYRINGE FOR KALEIDOSCOPE
1.2500 mg | INTRAVITREAL | Status: AC | PRN
  Administered 2022-07-05: 1.25 mg via INTRAVITREAL

## 2022-07-27 NOTE — Progress Notes (Signed)
[facility] - Clinic Note  08/02/2022   CHIEF COMPLAINT Patient presents for Retina Follow Up  HISTORY OF PRESENT ILLNESS: Molly Ferrell is a 72 y.o. female who presents to the clinic today for:  HPI     Retina Follow Up   Patient presents with  Wet AMD.  In both eyes.  This started 4 weeks ago.  Duration of 4 weeks.  Since onset it is stable.  I, the attending physician,  performed the HPI with the patient and updated documentation appropriately.        Comments   4 week retina follow up ARMD OD and IVA pt is reporting no vision changes noticed  has some floaters denies flashes        Last edited by Rennis Chris, MD on 08/02/2022  4:53 PM.    Pt states she had no problems after the first injection, she states vision is the same  Referring physician: Diona Foley, MD [address] [place],  [place] [address]  HISTORICAL INFORMATION:  Selected notes from the MEDICAL RECORD NUMBER Referred by Dr. Zenaida Niece for concern of exu ARMD OD LEE:  Ocular Hx- PMH-   CURRENT MEDICATIONS: No current outpatient medications on file. (Ophthalmic Drugs)   No current facility-administered medications for this visit. (Ophthalmic Drugs)   Current Outpatient Medications (Other)  Medication Sig   Ascorbic Acid (VITAMIN C) 1000 MG tablet Take 1,000 mg by mouth daily.   aspirin EC 81 MG tablet Take 81 mg by mouth 2 (two) times daily. Swallow whole.   atorvastatin (LIPITOR) 10 MG tablet Take 10 mg by mouth daily.   cholecalciferol (VITAMIN D3) 25 MCG (1000 UNIT) tablet Take 1,000 Units by mouth in the morning and at bedtime.   cyclobenzaprine (FLEXERIL) 5 MG tablet Take 1 tablet (5 mg total) by mouth 3 (three) times daily as needed for muscle spasms.   dicyclomine (BENTYL) 10 MG capsule Take 10 mg by mouth every 6 (six) hours as needed for spasms.   docusate sodium (COLACE) 100 MG capsule Take 1 capsule (100 mg total) by mouth 2 (two) times daily.   fish oil-omega-3  fatty acids 1000 MG capsule Take 1 g by mouth daily.   Lutein 20 MG TABS Take 20 mg by mouth 2 (two) times daily.   MAGNESIUM GLYCINATE PO Take 350 mg by mouth at bedtime.   Melatonin 5 MG TABS Take 5 mg by mouth at bedtime.   niacin 500 MG tablet Take 500 mg by mouth 2 (two) times daily.    omeprazole (PRILOSEC) 40 MG capsule Take 40 mg by mouth daily.   oxyCODONE-acetaminophen (PERCOCET/ROXICET) 5-325 MG tablet Take 1-2 tablets by mouth every 4 (four) hours as needed for moderate pain.   PRESCRIPTION MEDICATION APPLY 0.5GM (2 CLICKS) TO LABIA ONCE A DAY.   raloxifene (EVISTA) 60 MG tablet Take 1 tablet (60 mg total) by mouth daily.   Testosterone POWD Apply 1 Dose topically daily. Testosterone Cream 2% - APPLY 0.5GM (2 CLICKS) TO LABIA ONCE A DAY.   thyroid (ARMOUR) 60 MG tablet Take 60 mg by mouth daily before breakfast.   vitamin E 400 UNIT capsule Take 400 Units by mouth daily.   No current facility-administered medications for this visit. (Other)   REVIEW OF SYSTEMS: ROS   Positive for: Musculoskeletal, Eyes Last edited by Etheleen Mayhew, COT on 08/02/2022  2:46 PM.      ALLERGIES Allergies  Allergen Reactions   Penicillins Hives  and Itching    CHILDHOOD reaction to Pen G Has patient had a PCN reaction causing immediate rash, facial/tongue/throat swelling, SOB or lightheadedness with hypotension: No Has patient had a PCN reaction causing severe rash involving mucus membranes or skin necrosis: No Has patient had a PCN reaction that required hospitalization: No Has patient had a PCN reaction occurring within the last 10 years: No If all of the above answers are "NO", then may proceed with Cephalosporin use.      PAST MEDICAL HISTORY Past Medical History:  Diagnosis Date   Arthritis    Cancer (HCC) 07/2021   Right Breast Cancer-Stage 0; 1 month of radation per pt   Complication of anesthesia    "many years ago, had problems waking up"; patient stated no problems  with recent surgeries 01/21/2018   GERD (gastroesophageal reflux disease)    Hard of hearing    History of bronchitis    Hypercholesteremia    Hypertriglyceridemia    Hypothyroidism    Macular degeneration of both eyes    OA (osteoarthritis)    severe right knee   Spondylolisthesis of lumbar region    Wears glasses    Past Surgical History:  Procedure Laterality Date   ABDOMINAL HYSTERECTOMY     APPENDECTOMY     BACK SURGERY     lumbar   BREAST LUMPECTOMY Right 07/2021   CATARACT EXTRACTION W/ INTRAOCULAR LENS IMPLANT Bilateral    COLONOSCOPY     ESOPHAGOGASTRODUODENOSCOPY     JOINT REPLACEMENT     bil hip replacements   KNEE ARTHROPLASTY Left    TONSILLECTOMY     TOTAL KNEE ARTHROPLASTY Right 06/12/2016   Procedure: TOTAL KNEE ARTHROPLASTY;  Surgeon: Dannielle Huh, MD;  Location: [facility] OR;  Service: Orthopedics;  Laterality: Right;   TOTAL KNEE REVISION Left 06/14/2015   Procedure: TOTAL KNEE REVISION POLY EXCHANGE;  Surgeon: Dannielle Huh, MD;  Location: [facility] OR;  Service: Orthopedics;  Laterality: Left;   FAMILY HISTORY Family History  Problem Relation Age of Onset   Heart disease Mother    Heart disease Father    SOCIAL HISTORY Social History   Tobacco Use   Smoking status: Former   Smokeless tobacco: Never   Tobacco comments:    "quit 10-15 years ago" 06/03/15  Vaping Use   Vaping Use: Never used  Substance Use Topics   Alcohol use: Yes    Alcohol/week: 2.0 standard drinks of alcohol    Types: 2 Glasses of wine per week    Comment: weekends   Drug use: No       OPHTHALMIC EXAM:  Base Eye Exam     Visual Acuity (Snellen - Linear)       Right Left   Dist [place] 20/40 20/30 -2   Dist ph [place] NI NI         Tonometry (Tonopen, 2:49 PM)       Right Left   Pressure 14 12         Pupils       Pupils Dark Light Shape React APD   Right PERRL 3 3 Round Minimal None   Left PERRL 3 3 Round Minimal None         Visual Fields       Left Right    Full  Full         Extraocular Movement       Right Left    Full, Ortho Full, Ortho  Neuro/Psych     Oriented x3: Yes   Mood/Affect: Normal         Dilation     Both eyes: 2.5% Phenylephrine @ 2:49 PM           Slit Lamp and Fundus Exam     Slit Lamp Exam       Right Left   Lids/Lashes Dermatochalasis - upper lid Dermatochalasis - upper lid   Conjunctiva/Sclera White and quiet White and quiet   Cornea mild arcus, well healed cataract wound mild arcus, well healed cataract wound   Anterior Chamber deep and clear deep and clear   Iris Round and dilated Round and dilated   Lens PC IOL in good position PC IOL in good position   Anterior Vitreous Vitreous syneresis Vitreous syneresis, mild asteroid hyalosis         Fundus Exam       Right Left   Disc Pink and Sharp Pink and Sharp, mild PPA   C/D Ratio 0.4 0.2   Macula Flat, Blunted foveal reflex, refractile drusen, +CNV with shallow SRF IT macula -- improved / essentially resolved, trace cystic changes, no frank heme Flat, Blunted foveal reflex, refractile drusen, RPE mottling, clumping and early atrophy, No heme or edema   Vessels attenuated, Tortuous attenuated, Tortuous   Periphery Attached, No heme Attached, No heme           IMAGING AND PROCEDURES  Imaging and Procedures for 08/02/2022  OCT, Retina - OU - Both Eyes       Right Eye Quality was good. Central Foveal Thickness: 225. Progression has improved. Findings include no SRF, abnormal foveal contour, subretinal hyper-reflective material, intraretinal fluid, pigment epithelial detachment, subretinal fluid (Interval resolution of shallow SRF overlying PEDs IT macula and fovea, tr persistent IRF/cystic changes, Partial PVD).   Left Eye Quality was good. Central Foveal Thickness: 237. Progression has been stable. Findings include normal foveal contour, no IRF, no SRF, retinal drusen , pigment epithelial detachment, outer retinal atrophy (Scattered  drusen with patchy ORA, partial PVD).   Notes *Images captured and stored on drive  Diagnosis / Impression:  OD: exu ARMD -- Interval resolution of shallow SRF overlying PEDs IT macula and fovea, tr persistent IRF/cystic changes, Partial PVD OS: non-exu ARMD -- Scattered drusen with patchy ORA, partial PVD  Clinical management:  See below  Abbreviations: NFP - Normal foveal profile. CME - cystoid macular edema. PED - pigment epithelial detachment. IRF - intraretinal fluid. SRF - subretinal fluid. EZ - ellipsoid zone. ERM - epiretinal membrane. ORA - outer retinal atrophy. ORT - outer retinal tubulation. SRHM - subretinal hyper-reflective material. IRHM - intraretinal hyper-reflective material      Intravitreal Injection, Pharmacologic Agent - OD - Right Eye       Time Out 08/02/2022. 3:54 PM. Confirmed correct patient, procedure, site, and patient consented.   Anesthesia Topical anesthesia was used. Anesthetic medications included Lidocaine 2%, Proparacaine 0.5%.   Procedure Preparation included 5% betadine to ocular surface, eyelid speculum. A (32g) needle was used.   Injection: 1.25 mg Bevacizumab 1.25mg /0.47ml   Route: Intravitreal, Site: Right Eye   NDC: [id number], Lot: [id number], Expiration date: 10/28/2022   Post-op Post injection exam found visual acuity of at least counting fingers. The patient tolerated the procedure well. There were no complications. The patient received written and verbal post procedure care education.           ASSESSMENT/PLAN:   ICD-10-CM   1. Exudative age-related macular degeneration  of right eye with active choroidal neovascularization (HCC)  H35.3211 OCT, Retina - OU - Both Eyes    Intravitreal Injection, Pharmacologic Agent - OD - Right Eye    Bevacizumab (AVASTIN) SOLN 1.25 mg    2. Intermediate stage nonexudative age-related macular degeneration of left eye  H35.3122     3. Asteroid hyalosis of left eye  H43.22     4. Pseudophakia,  both eyes  Z96.1      Exudative age related macular degeneration, OD  - s/p IVA OD #1 (05.08.24)  - BCVA 20/40 OD -- stable  - OCT shows Interval resolution of shallow SRF overlying PEDs IT macula and fovea, tr persistent IRF/cystic changes   - recommend IVA OD #2 today, 06.05.24 - pt wishes to proceed with injection - RBA of procedure discussed, questions answered - informed consent obtained and signed - see procedure note  - f/u in 4 wks -- DFE/OCT, possible injection  2. Age related macular degeneration, non-exudative, left eye  - The incidence, anatomy, and pathology of dry AMD, risk of progression, and the AREDS and AREDS 2 study including smoking risks discussed with patient.  - Recommend amsler grid monitoring  - f/u 3 months, DFE, OCT  3. Asteroid hyalosis OS  4. Pseudophakia OU  - s/p CE/IOL OU (CEA, [place], [place])  - IOL in good position, doing well  - monitor  Ophthalmic Meds Ordered this visit:  Meds ordered this encounter  Medications   Bevacizumab (AVASTIN) SOLN 1.25 mg     Return in about 4 weeks (around 08/30/2022) for f/u exu ARMD OD, DFE, OCT.  There are no Patient Instructions on file for this visit.  Explained the diagnoses, plan, and follow up with the patient and they expressed understanding.  Patient expressed understanding of the importance of proper follow up care.   This document serves as a record of services personally performed by Karie Chimera, MD, PhD. It was created on their behalf by De Blanch, an ophthalmic technician. The creation of this record is the provider's dictation and/or activities during the visit.    Electronically signed by: De Blanch, OA, 08/02/22  4:56 PM  This document serves as a record of services personally performed by Karie Chimera, MD, PhD. It was created on their behalf by Glee Arvin. Manson Passey, OA an ophthalmic technician. The creation of this record is the provider's dictation and/or activities during the  visit.    Electronically signed by: Glee Arvin. Manson Passey, [place] 06.05.2024 4:56 PM  Karie Chimera, M.D., Ph.D. Diseases & Surgery of the Retina and Vitreous [facility] 08/02/2022  I have reviewed the above documentation for accuracy and completeness, and I agree with the above. Karie Chimera, M.D., Ph.D. 08/02/22 4:58 PM   Abbreviations: M myopia (nearsighted); A astigmatism; H hyperopia (farsighted); P presbyopia; Mrx spectacle prescription;  CTL contact lenses; OD right eye; OS left eye; OU both eyes  XT exotropia; ET esotropia; PEK punctate epithelial keratitis; PEE punctate epithelial erosions; DES dry eye syndrome; MGD meibomian gland dysfunction; ATs artificial tears; PFAT's preservative free artificial tears; NSC nuclear sclerotic cataract; PSC posterior subcapsular cataract; ERM epi-retinal membrane; PVD posterior vitreous detachment; RD retinal detachment; DM diabetes mellitus; DR diabetic retinopathy; NPDR non-proliferative diabetic retinopathy; PDR proliferative diabetic retinopathy; CSME clinically significant macular edema; DME diabetic macular edema; dbh dot blot hemorrhages; CWS cotton wool spot; POAG primary open angle glaucoma; C/D cup-to-disc ratio; HVF humphrey visual field; GVF goldmann visual field; OCT  optical coherence tomography; IOP intraocular pressure; BRVO Branch retinal vein occlusion; CRVO central retinal vein occlusion; CRAO central retinal artery occlusion; BRAO branch retinal artery occlusion; RT retinal tear; SB scleral buckle; PPV pars plana vitrectomy; VH Vitreous hemorrhage; PRP panretinal laser photocoagulation; IVK intravitreal kenalog; VMT vitreomacular traction; MH Macular hole;  NVD neovascularization of the disc; NVE neovascularization elsewhere; AREDS age related eye disease study; ARMD age related macular degeneration; POAG primary open angle glaucoma; EBMD epithelial/anterior basement membrane dystrophy; ACIOL anterior chamber intraocular  lens; IOL intraocular lens; PCIOL posterior chamber intraocular lens; Phaco/IOL phacoemulsification with intraocular lens placement; PRK photorefractive keratectomy; LASIK laser assisted in situ keratomileusis; HTN hypertension; DM diabetes mellitus; COPD chronic obstructive pulmonary disease

## 2022-08-02 ENCOUNTER — Encounter (INDEPENDENT_AMBULATORY_CARE_PROVIDER_SITE_OTHER): Payer: Self-pay | Admitting: Ophthalmology

## 2022-08-02 ENCOUNTER — Ambulatory Visit (INDEPENDENT_AMBULATORY_CARE_PROVIDER_SITE_OTHER): Payer: PPO | Admitting: Ophthalmology

## 2022-08-02 DIAGNOSIS — H353122 Nonexudative age-related macular degeneration, left eye, intermediate dry stage: Secondary | ICD-10-CM

## 2022-08-02 DIAGNOSIS — H4322 Crystalline deposits in vitreous body, left eye: Secondary | ICD-10-CM

## 2022-08-02 DIAGNOSIS — Z961 Presence of intraocular lens: Secondary | ICD-10-CM

## 2022-08-02 DIAGNOSIS — H353211 Exudative age-related macular degeneration, right eye, with active choroidal neovascularization: Secondary | ICD-10-CM

## 2022-08-02 MED ORDER — BEVACIZUMAB CHEMO INJECTION 1.25MG/0.05ML SYRINGE FOR KALEIDOSCOPE
1.2500 mg | INTRAVITREAL | Status: AC | PRN
  Administered 2022-08-02: 1.25 mg via INTRAVITREAL

## 2022-08-24 NOTE — Progress Notes (Signed)
[facility] - Clinic Note  08/30/2022   CHIEF COMPLAINT Patient presents for Retina Follow Up  HISTORY OF PRESENT ILLNESS: Molly Ferrell is a 72 y.o. female who presents to the clinic today for:  HPI     Retina Follow Up   Patient presents with  Wet AMD.  In both eyes.  This started 4.  Severity is moderate.  Duration of 4 weeks.  Since onset it is stable.  I, the attending physician,  performed the HPI with the patient and updated documentation appropriately.        Comments   4 week retina retina follow up AMD and IVA OD pt is reporting no vision changes noticed she denies any flashes or floaters       Last edited by Rennis Chris, MD on 08/30/2022  1:46 PM.    Pt states she had no problems after the first injection, she states vision is the same  Referring physician: Diona Foley, MD [address] [place],  [place] [address]  HISTORICAL INFORMATION:  Selected notes from the MEDICAL RECORD NUMBER Referred by Dr. Zenaida Niece for concern of exu ARMD OD LEE:  Ocular Hx- PMH-   CURRENT MEDICATIONS: No current outpatient medications on file. (Ophthalmic Drugs)   No current facility-administered medications for this visit. (Ophthalmic Drugs)   Current Outpatient Medications (Other)  Medication Sig   Ascorbic Acid (VITAMIN C) 1000 MG tablet Take 1,000 mg by mouth daily.   aspirin EC 81 MG tablet Take 81 mg by mouth 2 (two) times daily. Swallow whole.   atorvastatin (LIPITOR) 10 MG tablet Take 10 mg by mouth daily.   cholecalciferol (VITAMIN D3) 25 MCG (1000 UNIT) tablet Take 1,000 Units by mouth in the morning and at bedtime.   cyclobenzaprine (FLEXERIL) 5 MG tablet Take 1 tablet (5 mg total) by mouth 3 (three) times daily as needed for muscle spasms.   dicyclomine (BENTYL) 10 MG capsule Take 10 mg by mouth every 6 (six) hours as needed for spasms.   docusate sodium (COLACE) 100 MG capsule Take 1 capsule (100 mg total) by mouth 2 (two) times daily.    fish oil-omega-3 fatty acids 1000 MG capsule Take 1 g by mouth daily.   Lutein 20 MG TABS Take 20 mg by mouth 2 (two) times daily.   MAGNESIUM GLYCINATE PO Take 350 mg by mouth at bedtime.   Melatonin 5 MG TABS Take 5 mg by mouth at bedtime.   niacin 500 MG tablet Take 500 mg by mouth 2 (two) times daily.    omeprazole (PRILOSEC) 40 MG capsule Take 40 mg by mouth daily.   oxyCODONE-acetaminophen (PERCOCET/ROXICET) 5-325 MG tablet Take 1-2 tablets by mouth every 4 (four) hours as needed for moderate pain.   PRESCRIPTION MEDICATION APPLY 0.5GM (2 CLICKS) TO LABIA ONCE A DAY.   raloxifene (EVISTA) 60 MG tablet Take 1 tablet (60 mg total) by mouth daily.   Testosterone POWD Apply 1 Dose topically daily. Testosterone Cream 2% - APPLY 0.5GM (2 CLICKS) TO LABIA ONCE A DAY.   thyroid (ARMOUR) 60 MG tablet Take 60 mg by mouth daily before breakfast.   vitamin E 400 UNIT capsule Take 400 Units by mouth daily.   No current facility-administered medications for this visit. (Other)   REVIEW OF SYSTEMS: ROS   Positive for: Musculoskeletal, Eyes Last edited by Etheleen Mayhew, COT on 08/30/2022  1:05 PM.     ALLERGIES Allergies  Allergen Reactions   Penicillins  Hives and Itching    CHILDHOOD reaction to Pen G Has patient had a PCN reaction causing immediate rash, facial/tongue/throat swelling, SOB or lightheadedness with hypotension: No Has patient had a PCN reaction causing severe rash involving mucus membranes or skin necrosis: No Has patient had a PCN reaction that required hospitalization: No Has patient had a PCN reaction occurring within the last 10 years: No If all of the above answers are "NO", then may proceed with Cephalosporin use.      PAST MEDICAL HISTORY Past Medical History:  Diagnosis Date   Arthritis    Cancer (HCC) 07/2021   Right Breast Cancer-Stage 0; 1 month of radation per pt   Complication of anesthesia    "many years ago, had problems waking up"; patient stated  no problems with recent surgeries 01/21/2018   GERD (gastroesophageal reflux disease)    Hard of hearing    History of bronchitis    Hypercholesteremia    Hypertriglyceridemia    Hypothyroidism    Macular degeneration of both eyes    OA (osteoarthritis)    severe right knee   Spondylolisthesis of lumbar region    Wears glasses    Past Surgical History:  Procedure Laterality Date   ABDOMINAL HYSTERECTOMY     APPENDECTOMY     BACK SURGERY     lumbar   BREAST LUMPECTOMY Right 07/2021   CATARACT EXTRACTION W/ INTRAOCULAR LENS IMPLANT Bilateral    COLONOSCOPY     ESOPHAGOGASTRODUODENOSCOPY     JOINT REPLACEMENT     bil hip replacements   KNEE ARTHROPLASTY Left    TONSILLECTOMY     TOTAL KNEE ARTHROPLASTY Right 06/12/2016   Procedure: TOTAL KNEE ARTHROPLASTY;  Surgeon: Dannielle Huh, MD;  Location: [facility] OR;  Service: Orthopedics;  Laterality: Right;   TOTAL KNEE REVISION Left 06/14/2015   Procedure: TOTAL KNEE REVISION POLY EXCHANGE;  Surgeon: Dannielle Huh, MD;  Location: [facility] OR;  Service: Orthopedics;  Laterality: Left;   FAMILY HISTORY Family History  Problem Relation Age of Onset   Heart disease Mother    Heart disease Father    SOCIAL HISTORY Social History   Tobacco Use   Smoking status: Former   Smokeless tobacco: Never   Tobacco comments:    "quit 10-15 years ago" 06/03/15  Vaping Use   Vaping Use: Never used  Substance Use Topics   Alcohol use: Yes    Alcohol/week: 2.0 standard drinks of alcohol    Types: 2 Glasses of wine per week    Comment: weekends   Drug use: No       OPHTHALMIC EXAM:  Base Eye Exam     Visual Acuity (Snellen - Linear)       Right Left   Dist [place] 20/40 20/40   Dist ph [place] 20/30 20/40 +1         Tonometry (Tonopen, 1:10 PM)       Right Left   Pressure 11 14         Pupils       Pupils Dark Light Shape React APD   Right PERRL 3 2 Round Minimal None   Left PERRL 3 2 Round Minimal None         Visual Fields       Left  Right    Full Full         Extraocular Movement       Right Left    Full, Ortho Full, Ortho  Neuro/Psych     Oriented x3: Yes   Mood/Affect: Normal         Dilation     Both eyes: 2.5% Phenylephrine @ 1:10 PM           Slit Lamp and Fundus Exam     Slit Lamp Exam       Right Left   Lids/Lashes Dermatochalasis - upper lid Dermatochalasis - upper lid   Conjunctiva/Sclera White and quiet White and quiet   Cornea mild arcus, well healed cataract wound mild arcus, well healed cataract wound   Anterior Chamber deep and clear deep and clear   Iris Round and dilated Round and dilated   Lens PC IOL in good position PC IOL in good position   Anterior Vitreous Vitreous syneresis Vitreous syneresis, mild asteroid hyalosis         Fundus Exam       Right Left   Disc Pink and Sharp, +PPP Pink and Sharp, mild PPA   C/D Ratio 0.4 0.2   Macula Flat, Blunted foveal reflex, refractile drusen, +CNV with shallow SRF IT macula -- improved / stably resolved, trace cystic changes -- slightly increased, no frank heme, pigmented lesion along proximal arcades (approx 2DD) Flat, Blunted foveal reflex, refractile drusen, RPE mottling, clumping and early atrophy, No heme or edema   Vessels attenuated, Tortuous attenuated, Tortuous   Periphery Attached, No heme Attached, No heme           IMAGING AND PROCEDURES  Imaging and Procedures for 08/30/2022  OCT, Retina - OU - Both Eyes       Right Eye Quality was good. Central Foveal Thickness: 230. Progression has worsened. Findings include no SRF, abnormal foveal contour, subretinal hyper-reflective material, intraretinal fluid, pigment epithelial detachment, subretinal fluid (mild interval increase in IRF/cystic changes temporal macula, stable resolution of shallow SRF overlying PEDs IT macula and fovea, Partial PVD).   Left Eye Quality was good. Central Foveal Thickness: 232. Progression has been stable. Findings include  normal foveal contour, no IRF, no SRF, retinal drusen , pigment epithelial detachment, outer retinal atrophy (Scattered drusen with patchy ORA, partial PVD).   Notes *Images captured and stored on drive  Diagnosis / Impression:  OD: exu ARMD -- mild interval increase in IRF/cystic changes temporal macula, stable resolution of shallow SRF overlying PEDs IT macula and fovea, Partial PVD OS: non-exu ARMD -- Scattered drusen with patchy ORA, partial PVD  Clinical management:  See below  Abbreviations: NFP - Normal foveal profile. CME - cystoid macular edema. PED - pigment epithelial detachment. IRF - intraretinal fluid. SRF - subretinal fluid. EZ - ellipsoid zone. ERM - epiretinal membrane. ORA - outer retinal atrophy. ORT - outer retinal tubulation. SRHM - subretinal hyper-reflective material. IRHM - intraretinal hyper-reflective material      Intravitreal Injection, Pharmacologic Agent - OD - Right Eye       Time Out 08/30/2022. 1:32 PM. Confirmed correct patient, procedure, site, and patient consented.   Anesthesia Topical anesthesia was used. Anesthetic medications included Lidocaine 2%, Proparacaine 0.5%.   Procedure Preparation included 5% betadine to ocular surface, eyelid speculum. A (32g) needle was used.   Injection: 1.25 mg Bevacizumab 1.25mg /0.71ml   Route: Intravitreal, Site: Right Eye   NDC: [id number], Lot: [id number], Expiration date: 06/20/2023   Post-op Post injection exam found visual acuity of at least counting fingers. The patient tolerated the procedure well. There were no complications. The patient received written and verbal post procedure care education.  ASSESSMENT/PLAN:   ICD-10-CM   1. Exudative age-related macular degeneration of right eye with active choroidal neovascularization (HCC)  H35.3211 OCT, Retina - OU - Both Eyes    Intravitreal Injection, Pharmacologic Agent - OD - Right Eye    Bevacizumab (AVASTIN) SOLN 1.25 mg    2.  Intermediate stage nonexudative age-related macular degeneration of left eye  H35.3122     3. Asteroid hyalosis of left eye  H43.22     4. Nevus of choroid of right eye  D31.31     5. Pseudophakia, both eyes  Z96.1      Exudative age related macular degeneration, OD  - s/p IVA OD #1 (05.08.24), #2 (06.05.24)  - BCVA 20/30 OD -- stable  - OCT shows mild interval increase in IRF/cystic changes temporal macula, stableresolution of shallow SRF overlying PEDs IT macula and fovea, Partial PVD  - recommend IVA OD #3 today, 07.03.24 - pt wishes to proceed with injection - RBA of procedure discussed, questions answered - informed consent obtained and signed - see procedure note  - f/u in 4 wks -- DFE/OCT, possible injection  2. Age related macular degeneration, non-exudative, left eye  - The incidence, anatomy, and pathology of dry AMD, risk of progression, and the AREDS and AREDS 2 study including smoking risks discussed with patient.  - Recommend amsler grid monitoring  - f/u 3 months, DFE, OCT  3. Asteroid hyalosis OS  4. Choroidal Nevus, OD  - pigmented lesion along proximal arcades  - no visual symptoms, SRF or orange pigment  - +drusen  - thickness </> 2mm  - discussed findings, prognosis  - recommend monitoring  - f/u  5. Pseudophakia OU  - s/p CE/IOL OU (CEA, [place], [place])  - IOL in good position, doing well  - monitor  Ophthalmic Meds Ordered this visit:  Meds ordered this encounter  Medications   Bevacizumab (AVASTIN) SOLN 1.25 mg     Return in about 4 weeks (around 09/27/2022) for f/u exu ARMD OD, DFE, OCT.  There are no Patient Instructions on file for this visit.  Explained the diagnoses, plan, and follow up with the patient and they expressed understanding.  Patient expressed understanding of the importance of proper follow up care.   This document serves as a record of services personally performed by Karie Chimera, MD, PhD. It was created on their behalf  by De Blanch, an ophthalmic technician. The creation of this record is the provider's dictation and/or activities during the visit.    Electronically signed by: De Blanch, OA, 08/30/22  1:47 PM  This document serves as a record of services personally performed by Karie Chimera, MD, PhD. It was created on their behalf by Glee Arvin. Manson Passey, OA an ophthalmic technician. The creation of this record is the provider's dictation and/or activities during the visit.    Electronically signed by: Glee Arvin. Manson Passey, OA 08/30/22 1:47 PM  Karie Chimera, M.D., Ph.D. Diseases & Surgery of the Retina and Vitreous [facility] 08/30/2022  I have reviewed the above documentation for accuracy and completeness, and I agree with the above. Karie Chimera, M.D., Ph.D. 08/30/22 1:48 PM   Abbreviations: M myopia (nearsighted); A astigmatism; H hyperopia (farsighted); P presbyopia; Mrx spectacle prescription;  CTL contact lenses; OD right eye; OS left eye; OU both eyes  XT exotropia; ET esotropia; PEK punctate epithelial keratitis; PEE punctate epithelial erosions; DES dry eye syndrome; MGD meibomian gland dysfunction; ATs artificial tears;  PFAT's preservative free artificial tears; NSC nuclear sclerotic cataract; PSC posterior subcapsular cataract; ERM epi-retinal membrane; PVD posterior vitreous detachment; RD retinal detachment; DM diabetes mellitus; DR diabetic retinopathy; NPDR non-proliferative diabetic retinopathy; PDR proliferative diabetic retinopathy; CSME clinically significant macular edema; DME diabetic macular edema; dbh dot blot hemorrhages; CWS cotton wool spot; POAG primary open angle glaucoma; C/D cup-to-disc ratio; HVF humphrey visual field; GVF goldmann visual field; OCT optical coherence tomography; IOP intraocular pressure; BRVO Branch retinal vein occlusion; CRVO central retinal vein occlusion; CRAO central retinal artery occlusion; BRAO branch retinal artery  occlusion; RT retinal tear; SB scleral buckle; PPV pars plana vitrectomy; VH Vitreous hemorrhage; PRP panretinal laser photocoagulation; IVK intravitreal kenalog; VMT vitreomacular traction; MH Macular hole;  NVD neovascularization of the disc; NVE neovascularization elsewhere; AREDS age related eye disease study; ARMD age related macular degeneration; POAG primary open angle glaucoma; EBMD epithelial/anterior basement membrane dystrophy; ACIOL anterior chamber intraocular lens; IOL intraocular lens; PCIOL posterior chamber intraocular lens; Phaco/IOL phacoemulsification with intraocular lens placement; PRK photorefractive keratectomy; LASIK laser assisted in situ keratomileusis; HTN hypertension; DM diabetes mellitus; COPD chronic obstructive pulmonary disease

## 2022-08-30 ENCOUNTER — Ambulatory Visit (INDEPENDENT_AMBULATORY_CARE_PROVIDER_SITE_OTHER): Payer: PPO | Admitting: Ophthalmology

## 2022-08-30 ENCOUNTER — Encounter (INDEPENDENT_AMBULATORY_CARE_PROVIDER_SITE_OTHER): Payer: Self-pay | Admitting: Ophthalmology

## 2022-08-30 DIAGNOSIS — H353211 Exudative age-related macular degeneration, right eye, with active choroidal neovascularization: Secondary | ICD-10-CM

## 2022-08-30 DIAGNOSIS — Z961 Presence of intraocular lens: Secondary | ICD-10-CM

## 2022-08-30 DIAGNOSIS — D3131 Benign neoplasm of right choroid: Secondary | ICD-10-CM

## 2022-08-30 DIAGNOSIS — H353122 Nonexudative age-related macular degeneration, left eye, intermediate dry stage: Secondary | ICD-10-CM

## 2022-08-30 DIAGNOSIS — H4322 Crystalline deposits in vitreous body, left eye: Secondary | ICD-10-CM | POA: Diagnosis not present

## 2022-08-30 MED ORDER — BEVACIZUMAB CHEMO INJECTION 1.25MG/0.05ML SYRINGE FOR KALEIDOSCOPE
1.2500 mg | INTRAVITREAL | Status: AC | PRN
  Administered 2022-08-30: 1.25 mg via INTRAVITREAL

## 2022-09-06 NOTE — Progress Notes (Signed)
[facility]  [address] [place],  [place]  [address] [phone]    Clinic Date:09/12/22    Medical oncology follow-up visit     Subjective  Patient ID: Molly Ferrell, female    DOB: 11/29/1950, 72 y.o.   MRN: [id number]   Chief complaint: Breast cancer     HPI   Molly Ferrell is here today to discuss her breast cancer that was found on a screening mammogram by calcifications of the right breast. She regularly gets them every year. She went on to have a diagnostic mammogram, which showed suspicious calcifications in a linear distribution spanning 2.5 cm in the medial right breast. She  had a biopsy on Jul 08, 2021 which revealed DCIS. On August 08, 2021, she had lumpectomy by Dr.Moorhead, and he went back for further excisions to be sure of clear margins.She is healing well, and just feels sore. She has seen Dr.Palermo and has been scheduled for radiation. The final pathology revealed an 8 mm grade 3 ductal carcinoma in situ with micro-calcifications and necrosis.  All margins were clear. Estrogen receptors were positive at 80% and progesterone were also positive at 80%.  The patient is still on hormone replacement therapy with estradiol 2 mg daily, progesterone at night and testosterone cream. She was started on this since her hysterectomy at age 71. She had her first period at 72 years old. She had a hysterectomy at 72 years old for endometriosis but she has half an ovary remaining. She is nulliparous.     INTERVAL HISTORY Molly Ferrell is here today to follow up regarding her DCIS.  She had back surgery in November, 2023. She states she feels well and complain of chronic back pain. She is seeing a neurosurgeon for her back pain. Her last mammogram in March of 2024 was normal. Her next mammogram is in March with Dr Susann Givens, and I will recheck her in 1 year for evaluation. She  denies signs of infections such as sore throat, sinus drainage, cough or urinary  symptoms. She  denies fever or recurrent chills. She  also deny nausea, vomiting, chest pain dyspnea or cough. Her  appetite is great and Her  weight has increased 3 pounds over last 6 months            Past Medical History:  Diagnosis Date   Arthritis     Complication of anesthesia      "many years ago, had problems waking up"; patient stated no problems with recent surgeries 01/21/2018   GERD (gastroesophageal reflux disease)     Hard of hearing     History of bronchitis     Hypercholesteremia     Hypertriglyceridemia     Hypothyroidism     Macular degeneration of both eyes     OA (osteoarthritis)      severe right knee   Spondylolisthesis of lumbar region     Wears glasses             Past Surgical History:  Procedure Laterality Date   ABDOMINAL HYSTERECTOMY       APPENDECTOMY       BACK SURGERY        lumbar   CATARACT EXTRACTION W/ INTRAOCULAR LENS IMPLANT Bilateral     COLONOSCOPY       ESOPHAGOGASTRODUODENOSCOPY       JOINT REPLACEMENT       KNEE ARTHROPLASTY Left     TONSILLECTOMY       TOTAL  KNEE ARTHROPLASTY Right 06/12/2016    Procedure: TOTAL KNEE ARTHROPLASTY;  Surgeon: Dannielle Huh, MD;  Location: [facility] OR;  Service: Orthopedics;  Laterality: Right;   TOTAL KNEE REVISION Left 06/14/2015    Procedure: TOTAL KNEE REVISION POLY EXCHANGE;  Surgeon: Dannielle Huh, MD;  Location: [facility] OR;  Service: Orthopedics;  Laterality: Left;  Has had surgery L3 and L4 on back        Family History  Problem Relation Age of Onset   Heart disease Mother     Heart disease Father    Fathers side (all sisters) had breast cancer     SOCIAL HISTORY She Is married of 53 years She is a former smoker but quit 15 years ago She drinks alcohol occasionally (weekend)   Social History         Socioeconomic History   Marital status: Married      Spouse name: Not on file   Number of children: Not on file   Years of education: Not on file   Highest education level: Not on file   Occupational History   Not on file  Tobacco Use   Smoking status: Former   Smokeless tobacco: Never   Tobacco comments:      "quit 10-15 years ago" 06/03/15  Vaping Use   Vaping Use: Never used  Substance and Sexual Activity   Alcohol use: Yes      Alcohol/week: 1.0 standard drink of alcohol      Types: 1 Glasses of wine per week      Comment: weekends   Drug use: No   Sexual activity: Not on file  Other Topics Concern   Not on file  Social History Narrative   Not on file    Social Determinants of Health    Financial Resource Strain: Not on file  Food Insecurity: Not on file  Transportation Needs: Not on file  Physical Activity: Not on file  Stress: Not on file  Social Connections: Not on file  Intimate Partner Violence: Not on file            Outpatient Medications Prior to Visit  Medication Sig Dispense Refill   Ascorbic Acid (VITAMIN C) 1000 MG tablet Take 1,000 mg by mouth daily.       aspirin EC 81 MG tablet Take 81 mg by mouth 2 (two) times daily.        atorvastatin (LIPITOR) 10 MG tablet Take 10 mg by mouth daily.       Cholecalciferol (VITAMIN D) 2000 units CAPS Take 2,000 Units by mouth daily.       cyclobenzaprine (FLEXERIL) 10 MG tablet Take 1 tablet (10 mg total) by mouth 3 (three) times daily as needed for muscle spasms. 50 tablet 0   docusate sodium (COLACE) 100 MG capsule Take 1 capsule (100 mg total) by mouth 2 (two) times daily. 60 capsule 0   estradiol (ESTRACE) 2 MG tablet Take 2 mg by mouth daily.       Lutein 40 MG CAPS Take 40 mg by mouth 2 (two) times daily. MORNING & AFTERNOON       MAGNESIUM GLYCINATE PO Take 400 mg by mouth at bedtime.       Melatonin 5 MG TABS Take 5 mg by mouth at bedtime.       niacin 500 MG tablet Take 500 mg by mouth 2 (two) times daily.        Omega-3 Fatty Acids (FISH OIL PO) Take 1,280 mg by  mouth 2 (two) times daily.        omeprazole (PRILOSEC) 40 MG capsule Take 40 mg by mouth daily.       OVER THE COUNTER  MEDICATION Take 1 capsule by mouth daily. DIM Enhanced Delivery System       oxyCODONE (OXY IR/ROXICODONE) 5 MG immediate release tablet Take 1 tablet (5 mg total) by mouth every 4 (four) hours as needed for moderate pain ((score 4 to 6)). 30 tablet 0   progesterone (PROMETRIUM) 200 MG capsule Take 400 mg by mouth at bedtime. Compounded at [facility] in [place].   1   Propylene Glycol (SYSTANE COMPLETE) 0.6 % SOLN Place 1 drop into both eyes daily.       Testosterone POWD Apply 1 application topically at bedtime. Testosterone Cream 0.25%       thyroid (ARMOUR) 60 MG tablet Take 60 mg by mouth daily before breakfast.       vitamin E 400 UNIT capsule Take 400 Units by mouth daily.        No facility-administered medications prior to visit.           Allergies  Allergen Reactions   Penicillins Hives and Itching      CHILDHOOD reaction to Pen G Has patient had a PCN reaction causing immediate rash, facial/tongue/throat swelling, SOB or lightheadedness with hypotension: No Has patient had a PCN reaction causing severe rash involving mucus membranes or skin necrosis: No Has patient had a PCN reaction that required hospitalization: No Has patient had a PCN reaction occurring within the last 10 years: No If all of the above answers are "NO", then may proceed with Cephalosporin use.            Review of Systems  Constitutional: Negative.   HENT: Negative.    Eyes: Negative.   Respiratory: Negative.    Cardiovascular: Negative.   Gastrointestinal: Negative.   Genitourinary: Negative.   Musculoskeletal: Negative.   Skin: Negative.   Neurological: Negative.   Endo/Heme/Allergies: Negative.   Psychiatric/Behavioral: Negative.            Objective:  Review of Systems  Constitutional: Negative.  Negative for appetite change, chills, diaphoresis, fatigue, fever and unexpected weight change.  HENT:  Negative.  Negative for hearing loss, lump/mass, mouth sores, nosebleeds, sore throat,  tinnitus, trouble swallowing and voice change.   Eyes: Negative.  Negative for eye problems and icterus.  Respiratory: Negative.  Negative for chest tightness, cough, hemoptysis, shortness of breath and wheezing.   Cardiovascular: Negative.  Negative for chest pain, leg swelling and palpitations.  Gastrointestinal: Negative.  Negative for abdominal distention, abdominal pain, blood in stool, constipation, diarrhea, nausea, rectal pain and vomiting.  Endocrine: Positive for hot flashes.  Genitourinary: Negative.  Negative for bladder incontinence, difficulty urinating, dyspareunia, dysuria, frequency, hematuria, menstrual problem, nocturia, pelvic pain, vaginal bleeding and vaginal discharge.   Musculoskeletal:  Positive for arthralgias and back pain. Negative for flank pain, gait problem, myalgias, neck pain and neck stiffness.  Skin: Negative.  Negative for itching, rash and wound.  Neurological: Negative.  Negative for dizziness, extremity weakness, gait problem, headaches, light-headedness, numbness, seizures and speech difficulty.  Hematological: Negative.  Negative for adenopathy. Does not bruise/bleed easily.  Psychiatric/Behavioral: Negative.  Negative for depression. The patient is not nervous/anxious.       Physical Exam Constitutional:      General: She is not in acute distress.    Appearance: Normal appearance. She is normal weight. She is not  ill-appearing or toxic-appearing.  HENT:     Right Ear: Tympanic membrane normal. There is no impacted cerumen.     Left Ear: Tympanic membrane normal. There is no impacted cerumen.     Nose: Nose normal.  Eyes:     General: No scleral icterus.    Extraocular Movements: Extraocular movements intact.     Conjunctiva/sclera: Conjunctivae normal.     Pupils: Pupils are equal, round, and reactive to light.  Cardiovascular:     Rate and Rhythm: Normal rate and regular rhythm.     Pulses: Normal pulses.     Heart sounds: Normal heart sounds.  No murmur heard.    No gallop.  Pulmonary:     Effort: Pulmonary effort is normal. No respiratory distress.     Breath sounds: Normal breath sounds. No wheezing or rales.     Comments: Long incision of the lower back which is healing well. Chest:     Chest wall: No mass.  Breasts:    Right: No mass.     Left: No mass.     Comments: She has a scar along her superior areaolar complex on the right with some edema of the areola complex No masses in either breast    Abdominal:     General: Bowel sounds are normal.  Musculoskeletal:        General: Normal range of motion.     Cervical back: Normal range of motion and neck supple.  Neurological:     Mental Status: She is alert and oriented to person, place, and time.  Psychiatric:        Mood and Affect: Mood normal.        Behavior: Behavior normal.        Thought Content: Thought content normal.        Judgment: Judgment normal.        There were no vitals taken for this visit.    Wt Readings from Last 3 Encounters:  03/03/19 137 lb (62.1 kg)  02/27/19 138 lb 14.4 oz (63 kg)  01/21/18 133 lb 4.8 oz (60.5 kg)      Diabetic Foot Exam - Simple   No data filed      Recent Labs  Ref Range & Units 06/29/2022  WBC 4.40 - 11.00 10*3/uL 8.70  RBC 4.10 - 5.10 10*6/uL 5.11 High   Hemoglobin 12.3 - 15.3 g/dL 54.2 High   Hematocrit 35.9 - 44.6 % 47.2 High   Mean Corpuscular Volume (MCV) 80.0 - 96.0 fL 92.3  Mean Corpuscular Hemoglobin (MCH) 27.5 - 33.2 pg 32.0  Mean Corpuscular Hemoglobin Conc (MCHC) 33.0 - 37.0 g/dL 70.6  Red Cell Distribution Width (RDW) 12.3 - 17.0 % 13.9  Platelet Count (PLT) 150 - 450 10*3/uL 197      Component Ref Range & Units 06/29/2022  Sodium 136 - 145 mmol/L 143  Potassium 3.5 - 5.1 mmol/L 3.9  Chloride 98 - 107 mmol/L 108 High   CO2 21 - 31 mmol/L 25  Anion Gap 6 - 14 mmol/L 10  Glucose, Random 70 - 99 mg/dL 85  Blood Urea Nitrogen (BUN) 7 - 25 mg/dL 16  Creatinine 2.37 -  1.20 mg/dL 6.28  eGFR >31 DV/VOH/6.07P7 68  Albumin 3.5 - 5.7 g/dL 4.5  Total Protein 6.4 - 8.9 g/dL 7.0  Bilirubin, Total 0.3 - 1.0 mg/dL 0.5  Alkaline Phosphatase (ALP) 34 - 104 U/L 56  Aspartate Aminotransferase (AST) 13 - 39 U/L 16  Alanine Aminotransferase (ALT)  7 - 52 U/L 16  Calcium 8.6 - 10.3 mg/dL 9.3  BUN/Creatinine Ratio            Lab Results  Component Value Date    WBC 16.7 (H) 03/04/2019    HGB 13.8 03/04/2019    HCT 40.3 03/04/2019    PLT 201 03/04/2019    GLUCOSE 115 (H) 03/04/2019    ALT 35 06/01/2016    AST 25 06/01/2016    NA 139 03/04/2019    K 4.2 03/04/2019    CL 106 03/04/2019    CREATININE 0.96 03/04/2019    BUN 11 03/04/2019    CO2 24 03/04/2019    INR 1.03 06/03/2015        Recent Labs  No results found for: "TSH"   Recent Labs       Lab Results  Component Value Date    WBC 16.7 (H) 03/04/2019    HGB 13.8 03/04/2019    HCT 40.3 03/04/2019    MCV 90.4 03/04/2019    PLT 201 03/04/2019      Recent Labs       Lab Results  Component Value Date    NA 139 03/04/2019    K 4.2 03/04/2019    CO2 24 03/04/2019    GLUCOSE 115 (H) 03/04/2019    BUN 11 03/04/2019    CREATININE 0.96 03/04/2019    BILITOT 0.7 06/01/2016    ALKPHOS 53 06/01/2016    AST 25 06/01/2016    ALT 35 06/01/2016    PROT 7.1 06/01/2016    ALBUMIN 3.9 06/01/2016    CALCIUM 8.9 03/04/2019    ANIONGAP 9 03/04/2019      Recent Labs  No results found for: "CHOL"   Recent Labs  No results found for: "HDL"   Recent Labs  No results found for: "LDLCALC"   Recent Labs  No results found for: "TRIG"   Recent Labs  No results found for: "CHOLHDL"   Recent Labs  No results found for: "HGBA1C"    STUDIES  EXAM: 05/24/2022 DIGITAL DIAGNOSTIC BILATERAL MAMMOGRAM WITH TOMOSYNTHESIS IMPRESSION: No evidence of malignancy in either breast  EXAM: 01/25/2022 LUMBAR SPINE - 2-3 VIEW IMPRESSION: Fluoroscopic spot images of the lumbar spine during  posterior lumbar fusion.    Assessment & Plan:    Ductal carcinoma in situ right breast She has had resection and has completed radiotherapy.  I reviewed the diagnosis and pathology report. I emphasized her excellent prognosis.  She has been weaned off her hormone replacement therapy.  I offered chemoprevention with raloxifene, and we reviewed the risks and benefits. She has declined. She has no evidence of disease.     Plan:   Her bone density scan looks quite good and is essentially normal in the spine and forearm.  She is seeing a neurosurgeon for her back pain. Her last mammogram in March of 2024 was normal. Her next mammogram is in March with Dr Susann Givens, and I would recheck her in 1 year for evaluation. Her questions were answered and she understand and agrees with this plan of care.  She knows to contact our office if she develops concerns prior to her next appointment.   I provided 17 minutes of face-to-face time during this encounter and > 50% was spent counseling as documented under my assessment and plan.       Problem List Items Addressed This Visit              Other  Ductal carcinoma in situ (DCIS) of right breast - Primary           No orders of the defined types were placed in this encounter.           I,Oluwatobi Asade,acting as a scribe for Dellia Beckwith, MD.,have documented all relevant documentation on the behalf of Dellia Beckwith, MD,as directed by  Dellia Beckwith, MD while in the presence of Dellia Beckwith, MD.

## 2022-09-12 ENCOUNTER — Other Ambulatory Visit: Payer: Self-pay | Admitting: Oncology

## 2022-09-12 ENCOUNTER — Inpatient Hospital Stay: Payer: PPO | Attending: Oncology | Admitting: Oncology

## 2022-09-12 ENCOUNTER — Encounter: Payer: Self-pay | Admitting: Oncology

## 2022-09-12 VITALS — BP 123/83 | HR 77 | Temp 98.1°F | Resp 18 | Ht 60.0 in | Wt 142.5 lb

## 2022-09-12 DIAGNOSIS — D0511 Intraductal carcinoma in situ of right breast: Secondary | ICD-10-CM | POA: Diagnosis not present

## 2022-09-25 NOTE — Progress Notes (Signed)
[facility] - Clinic Note  09/27/2022   CHIEF COMPLAINT Patient presents for Retina Follow Up  HISTORY OF PRESENT ILLNESS: Molly Ferrell is a 72 y.o. female who presents to the clinic today for:  HPI     Retina Follow Up   Patient presents with  Wet AMD.  In right eye.  Severity is moderate.  Duration of 4 weeks.  Since onset it is stable.  I, the attending physician,  performed the HPI with the patient and updated documentation appropriately.        Comments   Patient states vision the same OU.      Last edited by Rennis Chris, MD on 09/27/2022  5:31 PM.      Referring physician: Diona Foley, MD [address] [place],  [place] [address]  HISTORICAL INFORMATION:  Selected notes from the MEDICAL RECORD NUMBER Referred by Dr. Zenaida Niece for concern of exu ARMD OD LEE:  Ocular Hx- PMH-   CURRENT MEDICATIONS: No current outpatient medications on file. (Ophthalmic Drugs)   No current facility-administered medications for this visit. (Ophthalmic Drugs)   Current Outpatient Medications (Other)  Medication Sig   Ascorbic Acid (VITAMIN C) 1000 MG tablet Take 1,000 mg by mouth daily.   aspirin EC 81 MG tablet Take 81 mg by mouth 2 (two) times daily. Swallow whole.   atorvastatin (LIPITOR) 10 MG tablet Take 10 mg by mouth daily.   cholecalciferol (VITAMIN D3) 25 MCG (1000 UNIT) tablet Take 1,000 Units by mouth in the morning and at bedtime.   cyclobenzaprine (FLEXERIL) 5 MG tablet Take 1 tablet (5 mg total) by mouth 3 (three) times daily as needed for muscle spasms.   dicyclomine (BENTYL) 10 MG capsule Take 10 mg by mouth every 6 (six) hours as needed for spasms.   docusate sodium (COLACE) 100 MG capsule Take 1 capsule (100 mg total) by mouth 2 (two) times daily.   fish oil-omega-3 fatty acids 1000 MG capsule Take 1 g by mouth daily.   Lutein 20 MG TABS Take 20 mg by mouth 2 (two) times daily.   MAGNESIUM GLYCINATE PO Take 350 mg by mouth at bedtime.    Melatonin 5 MG TABS Take 5 mg by mouth at bedtime.   niacin 500 MG tablet Take 500 mg by mouth 2 (two) times daily.    omeprazole (PRILOSEC) 40 MG capsule Take 40 mg by mouth daily.   oxyCODONE-acetaminophen (PERCOCET/ROXICET) 5-325 MG tablet Take 1-2 tablets by mouth every 4 (four) hours as needed for moderate pain.   PRESCRIPTION MEDICATION APPLY 0.5GM (2 CLICKS) TO LABIA ONCE A DAY.   Testosterone POWD Apply 1 Dose topically daily. Testosterone Cream 2% - APPLY 0.5GM (2 CLICKS) TO LABIA ONCE A DAY.   thyroid (ARMOUR) 60 MG tablet Take 60 mg by mouth daily before breakfast.   vitamin E 400 UNIT capsule Take 400 Units by mouth daily.   No current facility-administered medications for this visit. (Other)   REVIEW OF SYSTEMS: ROS   Positive for: Musculoskeletal, Eyes Negative for: Constitutional, Gastrointestinal, Neurological, Skin, Genitourinary, HENT, Endocrine, Cardiovascular, Respiratory, Psychiatric, Allergic/Imm, Heme/Lymph Last edited by Annalee Genta D, COT on 09/27/2022  1:09 PM.      ALLERGIES Allergies  Allergen Reactions   Penicillins Hives and Itching    CHILDHOOD reaction to Pen G Has patient had a PCN reaction causing immediate rash, facial/tongue/throat swelling, SOB or lightheadedness with hypotension: No Has patient had a PCN reaction causing severe rash involving  mucus membranes or skin necrosis: No Has patient had a PCN reaction that required hospitalization: No Has patient had a PCN reaction occurring within the last 10 years: No If all of the above answers are "NO", then may proceed with Cephalosporin use.      PAST MEDICAL HISTORY Past Medical History:  Diagnosis Date   Arthritis    Cancer (HCC) 07/2021   Right Breast Cancer-Stage 0; 1 month of radation per pt   Complication of anesthesia    "many years ago, had problems waking up"; patient stated no problems with recent surgeries 01/21/2018   GERD (gastroesophageal reflux disease)    Hard of hearing     History of bronchitis    Hypercholesteremia    Hypertriglyceridemia    Hypothyroidism    Macular degeneration of both eyes    OA (osteoarthritis)    severe right knee   Spondylolisthesis of lumbar region    Wears glasses    Past Surgical History:  Procedure Laterality Date   ABDOMINAL HYSTERECTOMY     APPENDECTOMY     BACK SURGERY     lumbar   BREAST LUMPECTOMY Right 07/2021   CATARACT EXTRACTION W/ INTRAOCULAR LENS IMPLANT Bilateral    COLONOSCOPY     ESOPHAGOGASTRODUODENOSCOPY     JOINT REPLACEMENT     bil hip replacements   KNEE ARTHROPLASTY Left    TONSILLECTOMY     TOTAL KNEE ARTHROPLASTY Right 06/12/2016   Procedure: TOTAL KNEE ARTHROPLASTY;  Surgeon: Dannielle Huh, MD;  Location: [facility] OR;  Service: Orthopedics;  Laterality: Right;   TOTAL KNEE REVISION Left 06/14/2015   Procedure: TOTAL KNEE REVISION POLY EXCHANGE;  Surgeon: Dannielle Huh, MD;  Location: [facility] OR;  Service: Orthopedics;  Laterality: Left;   FAMILY HISTORY Family History  Problem Relation Age of Onset   Heart disease Mother    Heart disease Father    SOCIAL HISTORY Social History   Tobacco Use   Smoking status: Former   Smokeless tobacco: Never   Tobacco comments:    "quit 10-15 years ago" 06/03/15  Vaping Use   Vaping status: Never Used  Substance Use Topics   Alcohol use: Yes    Alcohol/week: 2.0 standard drinks of alcohol    Types: 2 Glasses of wine per week    Comment: weekends   Drug use: No       OPHTHALMIC EXAM:  Base Eye Exam     Visual Acuity (Snellen - Linear)       Right Left   Dist cc 20/30 -1 20/30 -2   Dist ph cc NI 20/30 -1    Correction: Glasses         Tonometry (Tonopen, 1:20 PM)       Right Left   Pressure 15 16         Pupils       Dark Light Shape React APD   Right 3 2 Round Minimal None   Left 3 2 Round Minimal None         Visual Fields (Counting fingers)       Left Right    Full Full         Extraocular Movement       Right Left     Full, Ortho Full, Ortho         Neuro/Psych     Oriented x3: Yes   Mood/Affect: Normal         Dilation     Both eyes: 1.0% Mydriacyl,  2.5% Phenylephrine @ 1:19 PM           Slit Lamp and Fundus Exam     Slit Lamp Exam       Right Left   Lids/Lashes Dermatochalasis - upper lid Dermatochalasis - upper lid   Conjunctiva/Sclera White and quiet White and quiet   Cornea mild arcus, well healed cataract wound mild arcus, well healed cataract wound   Anterior Chamber deep and clear deep and clear   Iris Round and dilated Round and dilated   Lens PC IOL in good position PC IOL in good position   Anterior Vitreous Vitreous syneresis Vitreous syneresis, mild asteroid hyalosis         Fundus Exam       Right Left   Disc Pink and Sharp, +PPP Pink and Sharp, mild PPA   C/D Ratio 0.4 0.2   Macula Flat, Blunted foveal reflex, refractile drusen, +CNV with shallow SRF IT macula -- improved / stably resolved, trace cystic changes -- improved, no frank heme, pigmented lesion along proximal arcades (approx 2DD) Flat, Blunted foveal reflex, refractile drusen, RPE mottling, clumping and early atrophy, No heme or edema   Vessels attenuated, Tortuous attenuated, Tortuous   Periphery Attached, No heme Attached, No heme           IMAGING AND PROCEDURES  Imaging and Procedures for 09/27/2022  OCT, Retina - OU - Both Eyes       Right Eye Quality was good. Central Foveal Thickness: 235. Progression has improved. Findings include no SRF, abnormal foveal contour, subretinal hyper-reflective material, intraretinal fluid, pigment epithelial detachment, subretinal fluid (mild interval improvement in IRF/cystic changes temporal macula, stable resolution of shallow SRF overlying PEDs IT macula and fovea, Partial PVD).   Left Eye Quality was good. Central Foveal Thickness: 236. Progression has been stable. Findings include normal foveal contour, no IRF, no SRF, retinal drusen , pigment  epithelial detachment, outer retinal atrophy (Scattered drusen with patchy ORA, partial PVD).   Notes *Images captured and stored on drive  Diagnosis / Impression:  OD: exu ARMD -- mild interval improvement in IRF/cystic changes temporal macula, stable resolution of shallow SRF overlying PEDs IT macula and fovea, Partial PVD OS: non-exu ARMD -- Scattered drusen with patchy ORA, partial PVD  Clinical management:  See below  Abbreviations: NFP - Normal foveal profile. CME - cystoid macular edema. PED - pigment epithelial detachment. IRF - intraretinal fluid. SRF - subretinal fluid. EZ - ellipsoid zone. ERM - epiretinal membrane. ORA - outer retinal atrophy. ORT - outer retinal tubulation. SRHM - subretinal hyper-reflective material. IRHM - intraretinal hyper-reflective material      Intravitreal Injection, Pharmacologic Agent - OD - Right Eye       Time Out 09/27/2022. 2:00 PM. Confirmed correct patient, procedure, site, and patient consented.   Anesthesia Topical anesthesia was used. Anesthetic medications included Lidocaine 2%, Proparacaine 0.5%.   Procedure Preparation included 5% betadine to ocular surface, eyelid speculum. A supplied (32g) needle was used.   Injection: 1.25 mg Bevacizumab 1.25mg /0.47ml   Route: Intravitreal, Site: Right Eye   NDC: [id number], Lot: [id number], Expiration date: 10/08/2022   Post-op Post injection exam found visual acuity of at least counting fingers. The patient tolerated the procedure well. There were no complications. The patient received written and verbal post procedure care education.           ASSESSMENT/PLAN:   ICD-10-CM   1. Exudative age-related macular degeneration of right eye with active choroidal neovascularization (HCC)  H35.3211 OCT, Retina - OU - Both Eyes    Intravitreal Injection, Pharmacologic Agent - OD - Right Eye    Bevacizumab (AVASTIN) SOLN 1.25 mg    2. Intermediate stage nonexudative age-related macular  degeneration of left eye  H35.3122     3. Asteroid hyalosis of left eye  H43.22     4. Nevus of choroid of right eye  D31.31     5. Pseudophakia, both eyes  Z96.1      Exudative age related macular degeneration, OD  - s/p IVA OD #1 (05.08.24), #2 (06.05.24), #3 (07.03.24)  - BCVA 20/30 OD -- stable  - OCT shows mild interval improvement in IRF/cystic changes temporal macula, stable resolution of shallow SRF overlying PEDs IT macula and fovea, Partial PVD at 4 wks  - recommend IVA OD #4 today, 07.31.24 w/ f/u in 4 wks - pt wishes to proceed with injection - RBA of procedure discussed, questions answered - informed consent obtained and signed - see procedure note  - f/u in 4 wks -- DFE/OCT, possible injection  2. Age related macular degeneration, non-exudative, left eye  - The incidence, anatomy, and pathology of dry AMD, risk of progression, and the AREDS and AREDS 2 study including smoking risks discussed with patient.  - Recommend amsler grid monitoring  - f/u 3 months, DFE, OCT  3. Asteroid hyalosis OS  4. Choroidal Nevus, OD  - pigmented lesion along proximal arcades  - no visual symptoms, SRF or orange pigment  - +drusen  - thickness < 2mm  - discussed findings, prognosis  - monitoring  5. Pseudophakia OU  - s/p CE/IOL OU (CEA, [place], [place])  - IOL in good position, doing well  - monitor  Ophthalmic Meds Ordered this visit:  Meds ordered this encounter  Medications   Bevacizumab (AVASTIN) SOLN 1.25 mg     Return in about 4 weeks (around 10/25/2022) for exu ARMD OD, DFE, OCT, likely IVA OD.  There are no Patient Instructions on file for this visit.  Explained the diagnoses, plan, and follow up with the patient and they expressed understanding.  Patient expressed understanding of the importance of proper follow up care.   This document serves as a record of services personally performed by Karie Chimera, MD, PhD. It was created on their behalf by De Blanch, an ophthalmic technician. The creation of this record is the provider's dictation and/or activities during the visit.    Electronically signed by: De Blanch, OA, 09/27/22  11:46 PM  This document serves as a record of services personally performed by Karie Chimera, MD, PhD. It was created on their behalf by Glee Arvin. Manson Passey, OA an ophthalmic technician. The creation of this record is the provider's dictation and/or activities during the visit.    Electronically signed by: Glee Arvin. Manson Passey, OA 09/27/22 11:46 PM  Karie Chimera, M.D., Ph.D. Diseases & Surgery of the Retina and Vitreous [facility] 09/27/2022  I have reviewed the above documentation for accuracy and completeness, and I agree with the above. Karie Chimera, M.D., Ph.D. 09/27/22 11:48 PM   Abbreviations: M myopia (nearsighted); A astigmatism; H hyperopia (farsighted); P presbyopia; Mrx spectacle prescription;  CTL contact lenses; OD right eye; OS left eye; OU both eyes  XT exotropia; ET esotropia; PEK punctate epithelial keratitis; PEE punctate epithelial erosions; DES dry eye syndrome; MGD meibomian gland dysfunction; ATs artificial tears; PFAT's preservative free artificial tears; NSC nuclear sclerotic cataract; PSC posterior  subcapsular cataract; ERM epi-retinal membrane; PVD posterior vitreous detachment; RD retinal detachment; DM diabetes mellitus; DR diabetic retinopathy; NPDR non-proliferative diabetic retinopathy; PDR proliferative diabetic retinopathy; CSME clinically significant macular edema; DME diabetic macular edema; dbh dot blot hemorrhages; CWS cotton wool spot; POAG primary open angle glaucoma; C/D cup-to-disc ratio; HVF humphrey visual field; GVF goldmann visual field; OCT optical coherence tomography; IOP intraocular pressure; BRVO Branch retinal vein occlusion; CRVO central retinal vein occlusion; CRAO central retinal artery occlusion; BRAO branch retinal artery occlusion; RT  retinal tear; SB scleral buckle; PPV pars plana vitrectomy; VH Vitreous hemorrhage; PRP panretinal laser photocoagulation; IVK intravitreal kenalog; VMT vitreomacular traction; MH Macular hole;  NVD neovascularization of the disc; NVE neovascularization elsewhere; AREDS age related eye disease study; ARMD age related macular degeneration; POAG primary open angle glaucoma; EBMD epithelial/anterior basement membrane dystrophy; ACIOL anterior chamber intraocular lens; IOL intraocular lens; PCIOL posterior chamber intraocular lens; Phaco/IOL phacoemulsification with intraocular lens placement; PRK photorefractive keratectomy; LASIK laser assisted in situ keratomileusis; HTN hypertension; DM diabetes mellitus; COPD chronic obstructive pulmonary disease

## 2022-09-27 ENCOUNTER — Ambulatory Visit (INDEPENDENT_AMBULATORY_CARE_PROVIDER_SITE_OTHER): Payer: PPO | Admitting: Ophthalmology

## 2022-09-27 ENCOUNTER — Encounter (INDEPENDENT_AMBULATORY_CARE_PROVIDER_SITE_OTHER): Payer: Self-pay | Admitting: Ophthalmology

## 2022-09-27 DIAGNOSIS — H4322 Crystalline deposits in vitreous body, left eye: Secondary | ICD-10-CM | POA: Diagnosis not present

## 2022-09-27 DIAGNOSIS — H353211 Exudative age-related macular degeneration, right eye, with active choroidal neovascularization: Secondary | ICD-10-CM | POA: Diagnosis not present

## 2022-09-27 DIAGNOSIS — D3131 Benign neoplasm of right choroid: Secondary | ICD-10-CM | POA: Diagnosis not present

## 2022-09-27 DIAGNOSIS — Z961 Presence of intraocular lens: Secondary | ICD-10-CM

## 2022-09-27 DIAGNOSIS — H353122 Nonexudative age-related macular degeneration, left eye, intermediate dry stage: Secondary | ICD-10-CM | POA: Diagnosis not present

## 2022-09-27 MED ORDER — BEVACIZUMAB CHEMO INJECTION 1.25MG/0.05ML SYRINGE FOR KALEIDOSCOPE
1.2500 mg | INTRAVITREAL | Status: AC | PRN
  Administered 2022-09-27: 1.25 mg via INTRAVITREAL

## 2022-10-19 NOTE — Progress Notes (Signed)
[facility] - Clinic Note  10/25/2022   CHIEF COMPLAINT Patient presents for Retina Follow Up  HISTORY OF PRESENT ILLNESS: Molly Ferrell is a 72 y.o. female who presents to the clinic today for:  HPI     Retina Follow Up   Patient presents with  Wet AMD.  In right eye.  Severity is moderate.  Duration of 4 weeks.  Since onset it is stable.  I, the attending physician,  performed the HPI with the patient and updated documentation appropriately.        Comments   Pt states vision is stable, no fol or new floaters, uses Thera Tears PRN      Last edited by Rennis Chris, MD on 10/26/2022  1:37 PM.    Pt states vision is good, no new concerns  Referring physician: Diona Foley, MD [address] [place],  [place] [address]  HISTORICAL INFORMATION:  Selected notes from the MEDICAL RECORD NUMBER Referred by Dr. Zenaida Niece for concern of exu ARMD OD LEE:  Ocular Hx- PMH-   CURRENT MEDICATIONS: No current outpatient medications on file. (Ophthalmic Drugs)   No current facility-administered medications for this visit. (Ophthalmic Drugs)   Current Outpatient Medications (Other)  Medication Sig   Ascorbic Acid (VITAMIN C) 1000 MG tablet Take 1,000 mg by mouth daily.   aspirin EC 81 MG tablet Take 81 mg by mouth 2 (two) times daily. Swallow whole.   atorvastatin (LIPITOR) 10 MG tablet Take 10 mg by mouth daily.   cholecalciferol (VITAMIN D3) 25 MCG (1000 UNIT) tablet Take 1,000 Units by mouth in the morning and at bedtime.   cyclobenzaprine (FLEXERIL) 5 MG tablet Take 1 tablet (5 mg total) by mouth 3 (three) times daily as needed for muscle spasms.   dicyclomine (BENTYL) 10 MG capsule Take 10 mg by mouth every 6 (six) hours as needed for spasms.   docusate sodium (COLACE) 100 MG capsule Take 1 capsule (100 mg total) by mouth 2 (two) times daily.   fish oil-omega-3 fatty acids 1000 MG capsule Take 1 g by mouth daily.   Lutein 20 MG TABS Take 20 mg by mouth 2  (two) times daily.   MAGNESIUM GLYCINATE PO Take 350 mg by mouth at bedtime.   Melatonin 5 MG TABS Take 5 mg by mouth at bedtime.   niacin 500 MG tablet Take 500 mg by mouth 2 (two) times daily.    omeprazole (PRILOSEC) 40 MG capsule Take 40 mg by mouth daily.   oxyCODONE-acetaminophen (PERCOCET/ROXICET) 5-325 MG tablet Take 1-2 tablets by mouth every 4 (four) hours as needed for moderate pain.   PRESCRIPTION MEDICATION APPLY 0.5GM (2 CLICKS) TO LABIA ONCE A DAY.   Testosterone POWD Apply 1 Dose topically daily. Testosterone Cream 2% - APPLY 0.5GM (2 CLICKS) TO LABIA ONCE A DAY.   thyroid (ARMOUR) 60 MG tablet Take 60 mg by mouth daily before breakfast.   vitamin E 400 UNIT capsule Take 400 Units by mouth daily.   No current facility-administered medications for this visit. (Other)   REVIEW OF SYSTEMS: ROS   Positive for: Endocrine, Cardiovascular, Eyes Negative for: Constitutional, Gastrointestinal, Neurological, Skin, Genitourinary, Musculoskeletal, HENT, Respiratory, Psychiatric, Allergic/Imm, Heme/Lymph Last edited by Posey Boyer, COT on 10/25/2022  1:11 PM.     ALLERGIES Allergies  Allergen Reactions   Penicillins Hives and Itching    CHILDHOOD reaction to Pen G Has patient had a PCN reaction causing immediate rash, facial/tongue/throat swelling, SOB or  lightheadedness with hypotension: No Has patient had a PCN reaction causing severe rash involving mucus membranes or skin necrosis: No Has patient had a PCN reaction that required hospitalization: No Has patient had a PCN reaction occurring within the last 10 years: No If all of the above answers are "NO", then may proceed with Cephalosporin use.      PAST MEDICAL HISTORY Past Medical History:  Diagnosis Date   Arthritis    Cancer (HCC) 07/2021   Right Breast Cancer-Stage 0; 1 month of radation per pt   Complication of anesthesia    "many years ago, had problems waking up"; patient stated no problems with recent  surgeries 01/21/2018   GERD (gastroesophageal reflux disease)    Hard of hearing    History of bronchitis    Hypercholesteremia    Hypertriglyceridemia    Hypothyroidism    Macular degeneration of both eyes    OA (osteoarthritis)    severe right knee   Spondylolisthesis of lumbar region    Wears glasses    Past Surgical History:  Procedure Laterality Date   ABDOMINAL HYSTERECTOMY     APPENDECTOMY     BACK SURGERY     lumbar   BREAST LUMPECTOMY Right 07/2021   CATARACT EXTRACTION W/ INTRAOCULAR LENS IMPLANT Bilateral    COLONOSCOPY     ESOPHAGOGASTRODUODENOSCOPY     JOINT REPLACEMENT     bil hip replacements   KNEE ARTHROPLASTY Left    TONSILLECTOMY     TOTAL KNEE ARTHROPLASTY Right 06/12/2016   Procedure: TOTAL KNEE ARTHROPLASTY;  Surgeon: Dannielle Huh, MD;  Location: [facility] OR;  Service: Orthopedics;  Laterality: Right;   TOTAL KNEE REVISION Left 06/14/2015   Procedure: TOTAL KNEE REVISION POLY EXCHANGE;  Surgeon: Dannielle Huh, MD;  Location: [facility] OR;  Service: Orthopedics;  Laterality: Left;   FAMILY HISTORY Family History  Problem Relation Age of Onset   Heart disease Mother    Heart disease Father    SOCIAL HISTORY Social History   Tobacco Use   Smoking status: Former   Smokeless tobacco: Never   Tobacco comments:    "quit 10-15 years ago" 06/03/15  Vaping Use   Vaping status: Never Used  Substance Use Topics   Alcohol use: Yes    Alcohol/week: 2.0 standard drinks of alcohol    Types: 2 Glasses of wine per week    Comment: weekends   Drug use: No       OPHTHALMIC EXAM:  Base Eye Exam     Visual Acuity (Snellen - Linear)       Right Left   Dist [place] 20/40 -1 20/40 -2   Dist ph [place] 20/30 -2 NI         Tonometry (Tonopen, 1:15 PM)       Right Left   Pressure 16 13         Pupils       Dark Light Shape React APD   Right 3 2 Round Brisk None   Left 3 2 Round Brisk None         Visual Fields (Counting fingers)       Left Right    Full Full          Extraocular Movement       Right Left    Full, Ortho Full, Ortho         Neuro/Psych     Oriented x3: Yes   Mood/Affect: Normal  Dilation     Both eyes: 1.0% Mydriacyl, 2.5% Phenylephrine @ 1:16 PM           Slit Lamp and Fundus Exam     Slit Lamp Exam       Right Left   Lids/Lashes Dermatochalasis - upper lid Dermatochalasis - upper lid   Conjunctiva/Sclera White and quiet White and quiet   Cornea mild arcus, well healed cataract wound mild arcus, well healed cataract wound   Anterior Chamber deep and clear deep and clear   Iris Round and dilated Round and dilated   Lens PC IOL in good position PC IOL in good position   Anterior Vitreous Vitreous syneresis Vitreous syneresis, mild asteroid hyalosis         Fundus Exam       Right Left   Disc Pink and Sharp, +PPP Pink and Sharp, mild PPA   C/D Ratio 0.4 0.2   Macula Flat, Blunted foveal reflex, refractile drusen, +CNV with shallow SRF IT macula -- stably resolved, trace cystic changes -- slightly increased, no frank heme, pigmented lesion along proximal ST arcades (approx 2DD) Flat, Blunted foveal reflex, refractile drusen, RPE mottling, clumping and early atrophy, No heme or edema   Vessels attenuated, Tortuous attenuated, Tortuous   Periphery Attached, No heme Attached, No heme           IMAGING AND PROCEDURES  Imaging and Procedures for 10/25/2022  OCT, Retina - OU - Both Eyes       Right Eye Quality was good. Central Foveal Thickness: 243. Progression has worsened. Findings include no SRF, abnormal foveal contour, subretinal hyper-reflective material, intraretinal fluid, pigment epithelial detachment, subretinal fluid (mild interval increase in IRF/cystic changes temporal macula, stable resolution of shallow SRF overlying PEDs IT macula and fovea, Partial PVD).   Left Eye Quality was good. Central Foveal Thickness: 234. Progression has been stable. Findings include normal foveal  contour, no IRF, no SRF, retinal drusen , subretinal hyper-reflective material, pigment epithelial detachment, outer retinal atrophy (Scattered drusen with patchy ORA, partial PVD).   Notes *Images captured and stored on drive  Diagnosis / Impression:  OD: exu ARMD -- mild interval increase in IRF/cystic changes temporal macula, stable resolution of shallow SRF overlying PEDs IT macula and fovea, Partial PVD OS: non-exu ARMD -- Scattered drusen with patchy ORA, partial PVD  Clinical management:  See below  Abbreviations: NFP - Normal foveal profile. CME - cystoid macular edema. PED - pigment epithelial detachment. IRF - intraretinal fluid. SRF - subretinal fluid. EZ - ellipsoid zone. ERM - epiretinal membrane. ORA - outer retinal atrophy. ORT - outer retinal tubulation. SRHM - subretinal hyper-reflective material. IRHM - intraretinal hyper-reflective material      Intravitreal Injection, Pharmacologic Agent - OD - Right Eye       Time Out 10/25/2022. 1:27 PM. Confirmed correct patient, procedure, site, and patient consented.   Anesthesia Topical anesthesia was used. Anesthetic medications included Lidocaine 2%, Proparacaine 0.5%.   Procedure Preparation included 5% betadine to ocular surface, eyelid speculum. A supplied (32g) needle was used.   Injection: 1.25 mg Bevacizumab 1.25mg /0.41ml   Route: Intravitreal, Site: Right Eye   NDC: 16109-604-54, Lot: [id number] , Expiration date: 11/11/2022   Post-op Post injection exam found visual acuity of at least counting fingers. The patient tolerated the procedure well. There were no complications. The patient received written and verbal post procedure care education.           ASSESSMENT/PLAN:   ICD-10-CM   1.  Exudative age-related macular degeneration of right eye with active choroidal neovascularization (HCC)  H35.3211 OCT, Retina - OU - Both Eyes    Intravitreal Injection, Pharmacologic Agent - OD - Right Eye    Bevacizumab  (AVASTIN) SOLN 1.25 mg    2. Intermediate stage nonexudative age-related macular degeneration of left eye  H35.3122     3. Asteroid hyalosis of left eye  H43.22     4. Nevus of choroid of right eye  D31.31     5. Pseudophakia, both eyes  Z96.1      Exudative age related macular degeneration, OD  - s/p IVA OD #1 (05.08.24), #2 (06.05.24), #3 (07.03.24), #4 (07.31.24)  - BCVA 20/30 OD -- stable  - OCT OD shows mild interval increase in IRF/cystic changes temporal macula, stable resolution of shallow SRF overlying PEDs IT macula and fovea, Partial PVD at 4 wks  **discussed decreased efficacy / resistance to Avastin and potential benefit of switching medication**  - recommend IVA OD #5 today, 08.28.24 w/ f/u in 4 wks - pt wishes to proceed with injection - RBA of procedure discussed, questions answered - informed consent obtained and signed - see procedure note - will check Eylea for next visit  - f/u in 4 wks -- DFE/OCT, possible injection  2. Age related macular degeneration, non-exudative, left eye  - The incidence, anatomy, and pathology of dry AMD, risk of progression, and the AREDS and AREDS 2 study including smoking risks discussed with patient.  - Recommend amsler grid monitoring  - f/u 3 months, DFE, OCT  3. Asteroid hyalosis OS  4. Choroidal Nevus, OD  - pigmented lesion along proximal arcades  - no visual symptoms, SRF or orange pigment  - +drusen  - thickness < 2mm  - discussed findings, prognosis  - monitoring  5. Pseudophakia OU  - s/p CE/IOL OU (CEA, [place], [place])  - IOL in good position, doing well  - monitor  Ophthalmic Meds Ordered this visit:  Meds ordered this encounter  Medications   Bevacizumab (AVASTIN) SOLN 1.25 mg     Return in about 4 weeks (around 11/22/2022) for f/u exu ARMD OD, DFE, OCT.  There are no Patient Instructions on file for this visit.  Explained the diagnoses, plan, and follow up with the patient and they expressed  understanding.  Patient expressed understanding of the importance of proper follow up care.   This document serves as a record of services personally performed by Karie Chimera, MD, PhD. It was created on their behalf by De Blanch, an ophthalmic technician. The creation of this record is the provider's dictation and/or activities during the visit.    Electronically signed by: De Blanch, OA, 10/26/22  1:37 PM  This document serves as a record of services personally performed by Karie Chimera, MD, PhD. It was created on their behalf by Glee Arvin. Manson Passey, OA an ophthalmic technician. The creation of this record is the provider's dictation and/or activities during the visit.    Electronically signed by: Glee Arvin. Manson Passey, OA 10/26/22 1:37 PM  Karie Chimera, M.D., Ph.D. Diseases & Surgery of the Retina and Vitreous [facility] 10/25/2022  I have reviewed the above documentation for accuracy and completeness, and I agree with the above. Karie Chimera, M.D., Ph.D. 10/26/22 1:41 PM  Abbreviations: M myopia (nearsighted); A astigmatism; H hyperopia (farsighted); P presbyopia; Mrx spectacle prescription;  CTL contact lenses; OD right eye; OS left eye; OU both eyes  XT exotropia; ET esotropia; PEK punctate epithelial keratitis; PEE punctate epithelial erosions; DES dry eye syndrome; MGD meibomian gland dysfunction; ATs artificial tears; PFAT's preservative free artificial tears; NSC nuclear sclerotic cataract; PSC posterior subcapsular cataract; ERM epi-retinal membrane; PVD posterior vitreous detachment; RD retinal detachment; DM diabetes mellitus; DR diabetic retinopathy; NPDR non-proliferative diabetic retinopathy; PDR proliferative diabetic retinopathy; CSME clinically significant macular edema; DME diabetic macular edema; dbh dot blot hemorrhages; CWS cotton wool spot; POAG primary open angle glaucoma; C/D cup-to-disc ratio; HVF humphrey visual field; GVF goldmann  visual field; OCT optical coherence tomography; IOP intraocular pressure; BRVO Branch retinal vein occlusion; CRVO central retinal vein occlusion; CRAO central retinal artery occlusion; BRAO branch retinal artery occlusion; RT retinal tear; SB scleral buckle; PPV pars plana vitrectomy; VH Vitreous hemorrhage; PRP panretinal laser photocoagulation; IVK intravitreal kenalog; VMT vitreomacular traction; MH Macular hole;  NVD neovascularization of the disc; NVE neovascularization elsewhere; AREDS age related eye disease study; ARMD age related macular degeneration; POAG primary open angle glaucoma; EBMD epithelial/anterior basement membrane dystrophy; ACIOL anterior chamber intraocular lens; IOL intraocular lens; PCIOL posterior chamber intraocular lens; Phaco/IOL phacoemulsification with intraocular lens placement; PRK photorefractive keratectomy; LASIK laser assisted in situ keratomileusis; HTN hypertension; DM diabetes mellitus; COPD chronic obstructive pulmonary disease

## 2022-10-25 ENCOUNTER — Encounter: Payer: Self-pay | Admitting: Surgery

## 2022-10-25 ENCOUNTER — Ambulatory Visit (INDEPENDENT_AMBULATORY_CARE_PROVIDER_SITE_OTHER): Payer: PPO | Admitting: Ophthalmology

## 2022-10-25 ENCOUNTER — Encounter (INDEPENDENT_AMBULATORY_CARE_PROVIDER_SITE_OTHER): Payer: Self-pay | Admitting: Ophthalmology

## 2022-10-25 DIAGNOSIS — H353122 Nonexudative age-related macular degeneration, left eye, intermediate dry stage: Secondary | ICD-10-CM

## 2022-10-25 DIAGNOSIS — D3131 Benign neoplasm of right choroid: Secondary | ICD-10-CM

## 2022-10-25 DIAGNOSIS — H353211 Exudative age-related macular degeneration, right eye, with active choroidal neovascularization: Secondary | ICD-10-CM

## 2022-10-25 DIAGNOSIS — H4322 Crystalline deposits in vitreous body, left eye: Secondary | ICD-10-CM

## 2022-10-25 DIAGNOSIS — Z961 Presence of intraocular lens: Secondary | ICD-10-CM

## 2022-10-25 MED ORDER — BEVACIZUMAB CHEMO INJECTION 1.25MG/0.05ML SYRINGE FOR KALEIDOSCOPE
1.2500 mg | INTRAVITREAL | Status: AC | PRN
  Administered 2022-10-25: 1.25 mg via INTRAVITREAL

## 2022-11-15 NOTE — Progress Notes (Signed)
[facility] - Clinic Note  11/23/2022   CHIEF COMPLAINT Patient presents for Retina Follow Up  HISTORY OF PRESENT ILLNESS: Molly Ferrell is a 72 y.o. female who presents to the clinic today for:  HPI     Retina Follow Up   Patient presents with  Wet AMD.  In right eye.  This started 4 weeks ago.  I, the attending physician,  performed the HPI with the patient and updated documentation appropriately.        Comments   Patient here for 4 weeks retina follow up for exu ARMD OD. Patient states vision is fine. Some days better than other days. Has a lot of spots or floaters. Some days bad some days not as bad. No eye pain. Uses AT QAM OU,      Last edited by Rennis Chris, MD on 11/23/2022  5:12 PM.      Referring physician: Diona Foley, MD [address] [place],  [place] [address]  HISTORICAL INFORMATION:  Selected notes from the MEDICAL RECORD NUMBER Referred by Dr. Zenaida Niece for concern of exu ARMD OD LEE:  Ocular Hx- PMH-   CURRENT MEDICATIONS: No current outpatient medications on file. (Ophthalmic Drugs)   No current facility-administered medications for this visit. (Ophthalmic Drugs)   Current Outpatient Medications (Other)  Medication Sig   Ascorbic Acid (VITAMIN C) 1000 MG tablet Take 1,000 mg by mouth daily.   aspirin EC 81 MG tablet Take 81 mg by mouth 2 (two) times daily. Swallow whole.   atorvastatin (LIPITOR) 10 MG tablet Take 10 mg by mouth daily.   cholecalciferol (VITAMIN D3) 25 MCG (1000 UNIT) tablet Take 1,000 Units by mouth in the morning and at bedtime.   cyclobenzaprine (FLEXERIL) 5 MG tablet Take 1 tablet (5 mg total) by mouth 3 (three) times daily as needed for muscle spasms.   dicyclomine (BENTYL) 10 MG capsule Take 10 mg by mouth every 6 (six) hours as needed for spasms.   docusate sodium (COLACE) 100 MG capsule Take 1 capsule (100 mg total) by mouth 2 (two) times daily.   fish oil-omega-3 fatty acids 1000 MG capsule Take 1  g by mouth daily.   Lutein 20 MG TABS Take 20 mg by mouth 2 (two) times daily.   MAGNESIUM GLYCINATE PO Take 350 mg by mouth at bedtime.   Melatonin 5 MG TABS Take 5 mg by mouth at bedtime.   niacin 500 MG tablet Take 500 mg by mouth 2 (two) times daily.    omeprazole (PRILOSEC) 40 MG capsule Take 40 mg by mouth daily.   oxyCODONE-acetaminophen (PERCOCET/ROXICET) 5-325 MG tablet Take 1-2 tablets by mouth every 4 (four) hours as needed for moderate pain.   PRESCRIPTION MEDICATION APPLY 0.5GM (2 CLICKS) TO LABIA ONCE A DAY.   Testosterone POWD Apply 1 Dose topically daily. Testosterone Cream 2% - APPLY 0.5GM (2 CLICKS) TO LABIA ONCE A DAY.   thyroid (ARMOUR) 60 MG tablet Take 60 mg by mouth daily before breakfast.   vitamin E 400 UNIT capsule Take 400 Units by mouth daily.   No current facility-administered medications for this visit. (Other)   REVIEW OF SYSTEMS: ROS   Positive for: Endocrine, Cardiovascular, Eyes Negative for: Constitutional, Gastrointestinal, Neurological, Skin, Musculoskeletal, HENT, Respiratory, Psychiatric, Allergic/Imm, Heme/Lymph Last edited by Thompson Grayer, COT on 11/23/2022  1:12 PM.      ALLERGIES Allergies  Allergen Reactions   Penicillins Hives and Itching    CHILDHOOD reaction to  Pen G Has patient had a PCN reaction causing immediate rash, facial/tongue/throat swelling, SOB or lightheadedness with hypotension: No Has patient had a PCN reaction causing severe rash involving mucus membranes or skin necrosis: No Has patient had a PCN reaction that required hospitalization: No Has patient had a PCN reaction occurring within the last 10 years: No If all of the above answers are "NO", then may proceed with Cephalosporin use.      PAST MEDICAL HISTORY Past Medical History:  Diagnosis Date   Arthritis    Cancer (HCC) 07/2021   Right Breast Cancer-Stage 0; 1 month of radation per pt   Complication of anesthesia    "many years ago, had problems  waking up"; patient stated no problems with recent surgeries 01/21/2018   GERD (gastroesophageal reflux disease)    Hard of hearing    History of bronchitis    Hypercholesteremia    Hypertriglyceridemia    Hypothyroidism    Macular degeneration of both eyes    OA (osteoarthritis)    severe right knee   Spondylolisthesis of lumbar region    Wears glasses    Past Surgical History:  Procedure Laterality Date   ABDOMINAL HYSTERECTOMY     APPENDECTOMY     BACK SURGERY     lumbar   BREAST LUMPECTOMY Right 07/2021   CATARACT EXTRACTION W/ INTRAOCULAR LENS IMPLANT Bilateral    COLONOSCOPY     ESOPHAGOGASTRODUODENOSCOPY     JOINT REPLACEMENT     bil hip replacements   KNEE ARTHROPLASTY Left    TONSILLECTOMY     TOTAL KNEE ARTHROPLASTY Right 06/12/2016   Procedure: TOTAL KNEE ARTHROPLASTY;  Surgeon: Dannielle Huh, MD;  Location: [facility] OR;  Service: Orthopedics;  Laterality: Right;   TOTAL KNEE REVISION Left 06/14/2015   Procedure: TOTAL KNEE REVISION POLY EXCHANGE;  Surgeon: Dannielle Huh, MD;  Location: [facility] OR;  Service: Orthopedics;  Laterality: Left;   FAMILY HISTORY Family History  Problem Relation Age of Onset   Heart disease Mother    Heart disease Father    SOCIAL HISTORY Social History   Tobacco Use   Smoking status: Former   Smokeless tobacco: Never   Tobacco comments:    "quit 10-15 years ago" 06/03/15  Vaping Use   Vaping status: Never Used  Substance Use Topics   Alcohol use: Yes    Alcohol/week: 2.0 standard drinks of alcohol    Types: 2 Glasses of wine per week    Comment: weekends   Drug use: No       OPHTHALMIC EXAM:  Base Eye Exam     Visual Acuity (Snellen - Linear)       Right Left   Dist [place] 20/30 -1 20/40   Dist ph [place] NI NI         Tonometry (Tonopen, 12:44 PM)       Right Left   Pressure 10 11         Pupils       Dark Light Shape React APD   Right 3 2 Round Brisk None   Left 3 2 Round Brisk None         Visual Fields (Counting  fingers)       Left Right    Full Full         Neuro/Psych     Oriented x3: Yes   Mood/Affect: Normal         Dilation     Both eyes: 1.0% Mydriacyl, 2.5% Phenylephrine @  12:44 PM           Slit Lamp and Fundus Exam     Slit Lamp Exam       Right Left   Lids/Lashes Dermatochalasis - upper lid Dermatochalasis - upper lid   Conjunctiva/Sclera White and quiet White and quiet   Cornea mild arcus, well healed cataract wound mild arcus, well healed cataract wound   Anterior Chamber deep and clear deep and clear   Iris Round and dilated Round and dilated   Lens PC IOL in good position PC IOL in good position   Anterior Vitreous Vitreous syneresis Vitreous syneresis, mild asteroid hyalosis         Fundus Exam       Right Left   Disc Pink and Sharp, +PPP Pink and Sharp, mild PPA   C/D Ratio 0.4 0.2   Macula Flat, Blunted foveal reflex, refractile drusen, +CNV with shallow SRF IT macula -- stably resolved, persistent cystic changes, no frank heme, pigmented lesion along proximal ST arcades (approx 2DD) Flat, Blunted foveal reflex, refractile drusen, RPE mottling, clumping and early atrophy, No heme or edema   Vessels attenuated, Tortuous attenuated, Tortuous   Periphery Attached, No heme Attached, No heme           IMAGING AND PROCEDURES  Imaging and Procedures for 11/23/2022  OCT, Retina - OU - Both Eyes       Right Eye Quality was good. Central Foveal Thickness: 232. Progression has worsened. Findings include no SRF, abnormal foveal contour, subretinal hyper-reflective material, intraretinal fluid, pigment epithelial detachment, subretinal fluid (Persistent IRF/cystic changes temporal macula -- slightly increased, stable resolution of shallow SRF overlying PEDs IT macula and fovea, Partial PVD).   Left Eye Quality was good. Central Foveal Thickness: 226. Progression has been stable. Findings include normal foveal contour, no IRF, no SRF, retinal drusen ,  subretinal hyper-reflective material, pigment epithelial detachment, outer retinal atrophy (Scattered drusen with patchy ORA, partial PVD).   Notes *Images captured and stored on drive  Diagnosis / Impression:  OD: exu ARMD -- Persistent IRF/cystic changes temporal macula -- slightly increased, stable resolution of shallow SRF overlying PEDs IT macula and fovea, Partial PVD OS: non-exu ARMD -- Scattered drusen with patchy ORA, partial PVD  Clinical management:  See below  Abbreviations: NFP - Normal foveal profile. CME - cystoid macular edema. PED - pigment epithelial detachment. IRF - intraretinal fluid. SRF - subretinal fluid. EZ - ellipsoid zone. ERM - epiretinal membrane. ORA - outer retinal atrophy. ORT - outer retinal tubulation. SRHM - subretinal hyper-reflective material. IRHM - intraretinal hyper-reflective material      Intravitreal Injection, Pharmacologic Agent - OD - Right Eye       Time Out 11/23/2022. 1:10 PM. Confirmed correct patient, procedure, site, and patient consented.   Anesthesia Topical anesthesia was used. Anesthetic medications included Lidocaine 2%, Proparacaine 0.5%.   Procedure Preparation included 5% betadine to ocular surface, eyelid speculum. A (32g) needle was used.   Injection: 2 mg aflibercept 2 MG/0.05ML   Route: Intravitreal, Site: Right Eye   NDC: [id number], Lot: [id number], Expiration date: 12/28/2023, Waste: 0 mL   Post-op Post injection exam found visual acuity of at least counting fingers. The patient tolerated the procedure well. There were no complications. The patient received written and verbal post procedure care education.           ASSESSMENT/PLAN:   ICD-10-CM   1. Exudative age-related macular degeneration of right eye with active choroidal neovascularization (HCC)  H35.3211 OCT, Retina - OU - Both Eyes    Intravitreal Injection, Pharmacologic Agent - OD - Right Eye    aflibercept (EYLEA) SOLN 2 mg    2. Intermediate  stage nonexudative age-related macular degeneration of left eye  H35.3122     3. Asteroid hyalosis of left eye  H43.22     4. Nevus of choroid of right eye  D31.31     5. Pseudophakia, both eyes  Z96.1      Exudative age related macular degeneration, OD  - s/p IVA OD #1 (05.08.24), #2 (06.05.24), #3 (07.03.24), #4 (07.31.24), #5 (08.28.24)  - BCVA 20/30 OD -- stable  - OCT OD shows Persistent IRF/cystic changes temporal macula -- slightly increased, stable resolution of shallow SRF overlying PEDs IT macula and fovea, Partial PVD 4 wks  **discussed decreased efficacy / resistance to Avastin and potential benefit of switching medication**   - recommend switching to IVE OD #1 today, 09.26.24 w/ f/u in 4 wks - pt wishes to proceed with injection - RBA of procedure discussed, questions answered - IVE informed consent obtained and signed, 09.26.24 - see procedure note  - Eylea authorization approved  - f/u in 4 wks -- DFE/OCT, possible injection  2. Age related macular degeneration, non-exudative, left eye  - The incidence, anatomy, and pathology of dry AMD, risk of progression, and the AREDS and AREDS 2 study including smoking risks discussed with patient.  - Recommend amsler grid monitoring  - f/u 3 months, DFE, OCT  3. Asteroid hyalosis OS  4. Choroidal Nevus, OD  - pigmented lesion along proximal arcades  - no visual symptoms, SRF or orange pigment  - +drusen  - thickness < 2mm  - discussed findings, prognosis  - monitoring  5. Pseudophakia OU  - s/p CE/IOL OU (CEA, [place], [place])  - IOL in good position, doing well  - monitor  Ophthalmic Meds Ordered this visit:  Meds ordered this encounter  Medications   aflibercept (EYLEA) SOLN 2 mg     Return in about 4 weeks (around 12/21/2022) for f/u exu ARMD OD, DFE, OCT, Possible Injxn.  There are no Patient Instructions on file for this visit.  Explained the diagnoses, plan, and follow up with the patient and they expressed  understanding.  Patient expressed understanding of the importance of proper follow up care.   This document serves as a record of services personally performed by Karie Chimera, MD, PhD. It was created on their behalf by Glee Arvin. Manson Passey, OA an ophthalmic technician. The creation of this record is the provider's dictation and/or activities during the visit.    Electronically signed by: Glee Arvin. Manson Passey, OA 11/23/22 5:14 PM  Karie Chimera, M.D., Ph.D. Diseases & Surgery of the Retina and Vitreous [facility] 11/23/2022  I have reviewed the above documentation for accuracy and completeness, and I agree with the above. Karie Chimera, M.D., Ph.D. 11/23/22 5:15 PM   Abbreviations: M myopia (nearsighted); A astigmatism; H hyperopia (farsighted); P presbyopia; Mrx spectacle prescription;  CTL contact lenses; OD right eye; OS left eye; OU both eyes  XT exotropia; ET esotropia; PEK punctate epithelial keratitis; PEE punctate epithelial erosions; DES dry eye syndrome; MGD meibomian gland dysfunction; ATs artificial tears; PFAT's preservative free artificial tears; NSC nuclear sclerotic cataract; PSC posterior subcapsular cataract; ERM epi-retinal membrane; PVD posterior vitreous detachment; RD retinal detachment; DM diabetes mellitus; DR diabetic retinopathy; NPDR non-proliferative diabetic retinopathy; PDR proliferative diabetic retinopathy; CSME clinically  significant macular edema; DME diabetic macular edema; dbh dot blot hemorrhages; CWS cotton wool spot; POAG primary open angle glaucoma; C/D cup-to-disc ratio; HVF humphrey visual field; GVF goldmann visual field; OCT optical coherence tomography; IOP intraocular pressure; BRVO Branch retinal vein occlusion; CRVO central retinal vein occlusion; CRAO central retinal artery occlusion; BRAO branch retinal artery occlusion; RT retinal tear; SB scleral buckle; PPV pars plana vitrectomy; VH Vitreous hemorrhage; PRP panretinal laser  photocoagulation; IVK intravitreal kenalog; VMT vitreomacular traction; MH Macular hole;  NVD neovascularization of the disc; NVE neovascularization elsewhere; AREDS age related eye disease study; ARMD age related macular degeneration; POAG primary open angle glaucoma; EBMD epithelial/anterior basement membrane dystrophy; ACIOL anterior chamber intraocular lens; IOL intraocular lens; PCIOL posterior chamber intraocular lens; Phaco/IOL phacoemulsification with intraocular lens placement; PRK photorefractive keratectomy; LASIK laser assisted in situ keratomileusis; HTN hypertension; DM diabetes mellitus; COPD chronic obstructive pulmonary disease

## 2022-11-23 ENCOUNTER — Encounter (INDEPENDENT_AMBULATORY_CARE_PROVIDER_SITE_OTHER): Payer: Self-pay | Admitting: Ophthalmology

## 2022-11-23 ENCOUNTER — Ambulatory Visit (INDEPENDENT_AMBULATORY_CARE_PROVIDER_SITE_OTHER): Payer: PPO | Admitting: Ophthalmology

## 2022-11-23 DIAGNOSIS — Z961 Presence of intraocular lens: Secondary | ICD-10-CM

## 2022-11-23 DIAGNOSIS — H353211 Exudative age-related macular degeneration, right eye, with active choroidal neovascularization: Secondary | ICD-10-CM

## 2022-11-23 DIAGNOSIS — H353122 Nonexudative age-related macular degeneration, left eye, intermediate dry stage: Secondary | ICD-10-CM | POA: Diagnosis not present

## 2022-11-23 DIAGNOSIS — D3131 Benign neoplasm of right choroid: Secondary | ICD-10-CM | POA: Diagnosis not present

## 2022-11-23 DIAGNOSIS — H4322 Crystalline deposits in vitreous body, left eye: Secondary | ICD-10-CM | POA: Diagnosis not present

## 2022-11-23 MED ORDER — AFLIBERCEPT 2MG/0.05ML IZ SOLN FOR KALEIDOSCOPE
2.0000 mg | INTRAVITREAL | Status: AC | PRN
  Administered 2022-11-23: 2 mg via INTRAVITREAL

## 2022-12-20 NOTE — Progress Notes (Signed)
[facility] - Clinic Note  12/27/2022   CHIEF COMPLAINT Patient presents for Retina Follow Up  HISTORY OF PRESENT ILLNESS: Molly Ferrell is a 72 y.o. female who presents to the clinic today for:  HPI     Retina Follow Up   Patient presents with  Wet AMD.  In right eye.  This started 4 weeks ago.  I, the attending physician,  performed the HPI with the patient and updated documentation appropriately.        Comments   Patient states the vision is not as good as it was. She is using AT's OU PRN.       Last edited by Rennis Chris, MD on 12/27/2022  1:45 PM.    Pt states she had a headache for about a day after her first Eylea injection   Referring physician: Diona Foley, MD [address] [place],  [place] [address]  HISTORICAL INFORMATION:  Selected notes from the MEDICAL RECORD NUMBER Referred by Dr. Zenaida Niece for concern of exu ARMD OD LEE:  Ocular Hx- PMH-   CURRENT MEDICATIONS: No current outpatient medications on file. (Ophthalmic Drugs)   No current facility-administered medications for this visit. (Ophthalmic Drugs)   Current Outpatient Medications (Other)  Medication Sig   Ascorbic Acid (VITAMIN C) 1000 MG tablet Take 1,000 mg by mouth daily.   aspirin EC 81 MG tablet Take 81 mg by mouth 2 (two) times daily. Swallow whole.   atorvastatin (LIPITOR) 10 MG tablet Take 10 mg by mouth daily.   cholecalciferol (VITAMIN D3) 25 MCG (1000 UNIT) tablet Take 1,000 Units by mouth in the morning and at bedtime.   cyclobenzaprine (FLEXERIL) 5 MG tablet Take 1 tablet (5 mg total) by mouth 3 (three) times daily as needed for muscle spasms.   dicyclomine (BENTYL) 10 MG capsule Take 10 mg by mouth every 6 (six) hours as needed for spasms.   docusate sodium (COLACE) 100 MG capsule Take 1 capsule (100 mg total) by mouth 2 (two) times daily.   fish oil-omega-3 fatty acids 1000 MG capsule Take 1 g by mouth daily.   Lutein 20 MG TABS Take 20 mg by mouth 2  (two) times daily.   MAGNESIUM GLYCINATE PO Take 350 mg by mouth at bedtime.   Melatonin 5 MG TABS Take 5 mg by mouth at bedtime.   niacin 500 MG tablet Take 500 mg by mouth 2 (two) times daily.    omeprazole (PRILOSEC) 40 MG capsule Take 40 mg by mouth daily.   oxyCODONE-acetaminophen (PERCOCET/ROXICET) 5-325 MG tablet Take 1-2 tablets by mouth every 4 (four) hours as needed for moderate pain.   PRESCRIPTION MEDICATION APPLY 0.5GM (2 CLICKS) TO LABIA ONCE A DAY.   Testosterone POWD Apply 1 Dose topically daily. Testosterone Cream 2% - APPLY 0.5GM (2 CLICKS) TO LABIA ONCE A DAY.   thyroid (ARMOUR) 60 MG tablet Take 60 mg by mouth daily before breakfast.   vitamin E 400 UNIT capsule Take 400 Units by mouth daily.   No current facility-administered medications for this visit. (Other)   REVIEW OF SYSTEMS: ROS   Positive for: Endocrine, Cardiovascular, Eyes Negative for: Constitutional, Gastrointestinal, Neurological, Skin, Musculoskeletal, HENT, Respiratory, Psychiatric, Allergic/Imm, Heme/Lymph Last edited by Charlette Caffey, COT on 12/27/2022 12:56 PM.       ALLERGIES Allergies  Allergen Reactions   Penicillins Hives and Itching    CHILDHOOD reaction to Pen G Has patient had a PCN reaction causing immediate rash, facial/tongue/throat  swelling, SOB or lightheadedness with hypotension: No Has patient had a PCN reaction causing severe rash involving mucus membranes or skin necrosis: No Has patient had a PCN reaction that required hospitalization: No Has patient had a PCN reaction occurring within the last 10 years: No If all of the above answers are "NO", then may proceed with Cephalosporin use.      PAST MEDICAL HISTORY Past Medical History:  Diagnosis Date   Arthritis    Cancer (HCC) 07/2021   Right Breast Cancer-Stage 0; 1 month of radation per pt   Complication of anesthesia    "many years ago, had problems waking up"; patient stated no problems with recent surgeries  01/21/2018   GERD (gastroesophageal reflux disease)    Hard of hearing    History of bronchitis    Hypercholesteremia    Hypertriglyceridemia    Hypothyroidism    Macular degeneration of both eyes    OA (osteoarthritis)    severe right knee   Spondylolisthesis of lumbar region    Wears glasses    Past Surgical History:  Procedure Laterality Date   ABDOMINAL HYSTERECTOMY     APPENDECTOMY     BACK SURGERY     lumbar   BREAST LUMPECTOMY Right 07/2021   CATARACT EXTRACTION W/ INTRAOCULAR LENS IMPLANT Bilateral    COLONOSCOPY     ESOPHAGOGASTRODUODENOSCOPY     JOINT REPLACEMENT     bil hip replacements   KNEE ARTHROPLASTY Left    TONSILLECTOMY     TOTAL KNEE ARTHROPLASTY Right 06/12/2016   Procedure: TOTAL KNEE ARTHROPLASTY;  Surgeon: Dannielle Huh, MD;  Location: [facility] OR;  Service: Orthopedics;  Laterality: Right;   TOTAL KNEE REVISION Left 06/14/2015   Procedure: TOTAL KNEE REVISION POLY EXCHANGE;  Surgeon: Dannielle Huh, MD;  Location: [facility] OR;  Service: Orthopedics;  Laterality: Left;   FAMILY HISTORY Family History  Problem Relation Age of Onset   Heart disease Mother    Heart disease Father    SOCIAL HISTORY Social History   Tobacco Use   Smoking status: Former   Smokeless tobacco: Never   Tobacco comments:    "quit 10-15 years ago" 06/03/15  Vaping Use   Vaping status: Never Used  Substance Use Topics   Alcohol use: Yes    Alcohol/week: 2.0 standard drinks of alcohol    Types: 2 Glasses of wine per week    Comment: weekends   Drug use: No       OPHTHALMIC EXAM:  Base Eye Exam     Visual Acuity (Snellen - Linear)       Right Left   Dist [place] 20/30 20/40   Dist ph [place] NI NI         Tonometry (Tonopen, 12:58 PM)       Right Left   Pressure 17 17         Pupils       Dark Light Shape React APD   Right 3 2 Round Brisk None   Left 3 2 Round Brisk None         Visual Fields       Left Right    Full Full         Extraocular Movement        Right Left    Full, Ortho Full, Ortho         Neuro/Psych     Oriented x3: Yes   Mood/Affect: Normal         Dilation  Both eyes: 1.0% Mydriacyl, 2.5% Phenylephrine @ 12:56 PM           Slit Lamp and Fundus Exam     Slit Lamp Exam       Right Left   Lids/Lashes Dermatochalasis - upper lid Dermatochalasis - upper lid   Conjunctiva/Sclera White and quiet White and quiet   Cornea mild arcus, well healed cataract wound mild arcus, well healed cataract wound   Anterior Chamber deep and clear deep and clear   Iris Round and dilated Round and dilated   Lens PC IOL in good position PC IOL in good position   Anterior Vitreous Vitreous syneresis Vitreous syneresis, mild asteroid hyalosis         Fundus Exam       Right Left   Disc Pink and Sharp, +PPP Pink and Sharp, mild PPA   C/D Ratio 0.4 0.2   Macula Flat, Blunted foveal reflex, refractile drusen, +CNV with shallow SRF IT macula -- stably resolved, cystic changes -- stably resolved, no frank heme, pigmented lesion along proximal ST arcades (approx 2DD) Flat, Blunted foveal reflex, refractile drusen, RPE mottling, clumping and early atrophy, No heme or edema   Vessels attenuated, Tortuous attenuated, mild tortuosity   Periphery Attached, No heme Attached, No heme           IMAGING AND PROCEDURES  Imaging and Procedures for 12/27/2022  OCT, Retina - OU - Both Eyes       Right Eye Quality was good. Central Foveal Thickness: 233. Progression has improved. Findings include no IRF, no SRF, abnormal foveal contour, retinal drusen , subretinal hyper-reflective material, pigment epithelial detachment (Interval resolution of IRF/cystic changes temporal macula, stable resolution of shallow SRF overlying PEDs IT macula and fovea, Partial PVD).   Left Eye Quality was good. Central Foveal Thickness: 226. Progression has been stable. Findings include normal foveal contour, no IRF, no SRF, retinal drusen , subretinal  hyper-reflective material, pigment epithelial detachment, outer retinal atrophy (Scattered drusen with patchy ORA, partial PVD).   Notes *Images captured and stored on drive  Diagnosis / Impression:  OD: exu ARMD -- Interval resolution of IRF/cystic changes temporal macula, stable resolution of shallow SRF overlying PEDs IT macula and fovea, Partial PVD OS: non-exu ARMD -- Scattered drusen with patchy ORA, partial PVD  Clinical management:  See below  Abbreviations: NFP - Normal foveal profile. CME - cystoid macular edema. PED - pigment epithelial detachment. IRF - intraretinal fluid. SRF - subretinal fluid. EZ - ellipsoid zone. ERM - epiretinal membrane. ORA - outer retinal atrophy. ORT - outer retinal tubulation. SRHM - subretinal hyper-reflective material. IRHM - intraretinal hyper-reflective material      Intravitreal Injection, Pharmacologic Agent - OD - Right Eye       Time Out 12/27/2022. 1:19 PM. Confirmed correct patient, procedure, site, and patient consented.   Anesthesia Topical anesthesia was used. Anesthetic medications included Lidocaine 2%, Proparacaine 0.5%.   Procedure Preparation included 5% betadine to ocular surface, eyelid speculum. A (32g) needle was used.   Injection: 2 mg aflibercept 2 MG/0.05ML   Route: Intravitreal, Site: Right Eye   NDC: [id number], Lot: [id number], Expiration date: 12/28/2023, Waste: 0 mL   Post-op Post injection exam found visual acuity of at least counting fingers. The patient tolerated the procedure well. There were no complications. The patient received written and verbal post procedure care education.            ASSESSMENT/PLAN:   ICD-10-CM   1. Exudative age-related  macular degeneration of right eye with active choroidal neovascularization (HCC)  H35.3211 OCT, Retina - OU - Both Eyes    Intravitreal Injection, Pharmacologic Agent - OD - Right Eye    aflibercept (EYLEA) SOLN 2 mg    2. Intermediate stage nonexudative  age-related macular degeneration of left eye  H35.3122     3. Asteroid hyalosis of left eye  H43.22     4. Nevus of choroid of right eye  D31.31     5. Pseudophakia, both eyes  Z96.1       Exudative age related macular degeneration, OD  - s/p IVA OD #1 (05.08.24), #2 (06.05.24), #3 (07.03.24), #4 (07.31.24), #5 (08.28.24)  - s/p IVE OD #1 (09.26.24)  - BCVA 20/30 OD -- stable  - OCT OD shows interval resolution of IRF/cystic changes temporal macula, stable resolution of shallow SRF overlying PEDs IT macula and fovea, Partial PVD at 4.9 wks  - recommend IVE OD #2 today, 10.30.24 w/ f/u extended to 5 wks - pt wishes to proceed with injection - RBA of procedure discussed, questions answered - IVE informed consent obtained and signed, 09.26.24 - see procedure note  - Eylea authorization approved  - f/u in 5 wks -- DFE/OCT, possible injection  2. Age related macular degeneration, non-exudative, left eye  - The incidence, anatomy, and pathology of dry AMD, risk of progression, and the AREDS and AREDS 2 study including smoking risks discussed with patient.  - Recommend amsler grid monitoring  - f/u 3 months, DFE, OCT  3. Asteroid hyalosis OS  4. Choroidal Nevus, OD  - pigmented lesion along proximal arcades  - no visual symptoms, SRF or orange pigment  - +drusen  - thickness < 2mm  - discussed findings, prognosis  - monitoring  5. Pseudophakia OU  - s/p CE/IOL OU (CEA, [place], [place])  - IOL in good position, doing well  - monitor  Ophthalmic Meds Ordered this visit:  Meds ordered this encounter  Medications   aflibercept (EYLEA) SOLN 2 mg     Return in about 5 weeks (around 01/31/2023) for f/u exu ARMD OD, DFE, OCT.  There are no Patient Instructions on file for this visit.  Explained the diagnoses, plan, and follow up with the patient and they expressed understanding.  Patient expressed understanding of the importance of proper follow up care.   This document serves as  a record of services personally performed by Karie Chimera, MD, PhD. It was created on their behalf by De Blanch, an ophthalmic technician. The creation of this record is the provider's dictation and/or activities during the visit.    Electronically signed by: De Blanch, OA, 12/27/22  1:47 PM  This document serves as a record of services personally performed by Karie Chimera, MD, PhD. It was created on their behalf by Glee Arvin. Manson Passey, OA an ophthalmic technician. The creation of this record is the provider's dictation and/or activities during the visit.    Electronically signed by: Glee Arvin. Manson Passey, OA 12/27/22 1:47 PM  Karie Chimera, M.D., Ph.D. Diseases & Surgery of the Retina and Vitreous [facility] 12/27/2022  I have reviewed the above documentation for accuracy and completeness, and I agree with the above. Karie Chimera, M.D., Ph.D. 12/27/22 1:47 PM   Abbreviations: M myopia (nearsighted); A astigmatism; H hyperopia (farsighted); P presbyopia; Mrx spectacle prescription;  CTL contact lenses; OD right eye; OS left eye; OU both eyes  XT exotropia; ET esotropia; PEK  punctate epithelial keratitis; PEE punctate epithelial erosions; DES dry eye syndrome; MGD meibomian gland dysfunction; ATs artificial tears; PFAT's preservative free artificial tears; NSC nuclear sclerotic cataract; PSC posterior subcapsular cataract; ERM epi-retinal membrane; PVD posterior vitreous detachment; RD retinal detachment; DM diabetes mellitus; DR diabetic retinopathy; NPDR non-proliferative diabetic retinopathy; PDR proliferative diabetic retinopathy; CSME clinically significant macular edema; DME diabetic macular edema; dbh dot blot hemorrhages; CWS cotton wool spot; POAG primary open angle glaucoma; C/D cup-to-disc ratio; HVF humphrey visual field; GVF goldmann visual field; OCT optical coherence tomography; IOP intraocular pressure; BRVO Branch retinal vein occlusion; CRVO  central retinal vein occlusion; CRAO central retinal artery occlusion; BRAO branch retinal artery occlusion; RT retinal tear; SB scleral buckle; PPV pars plana vitrectomy; VH Vitreous hemorrhage; PRP panretinal laser photocoagulation; IVK intravitreal kenalog; VMT vitreomacular traction; MH Macular hole;  NVD neovascularization of the disc; NVE neovascularization elsewhere; AREDS age related eye disease study; ARMD age related macular degeneration; POAG primary open angle glaucoma; EBMD epithelial/anterior basement membrane dystrophy; ACIOL anterior chamber intraocular lens; IOL intraocular lens; PCIOL posterior chamber intraocular lens; Phaco/IOL phacoemulsification with intraocular lens placement; PRK photorefractive keratectomy; LASIK laser assisted in situ keratomileusis; HTN hypertension; DM diabetes mellitus; COPD chronic obstructive pulmonary disease

## 2022-12-27 ENCOUNTER — Encounter (INDEPENDENT_AMBULATORY_CARE_PROVIDER_SITE_OTHER): Payer: Self-pay | Admitting: Ophthalmology

## 2022-12-27 ENCOUNTER — Ambulatory Visit (INDEPENDENT_AMBULATORY_CARE_PROVIDER_SITE_OTHER): Payer: PPO | Admitting: Ophthalmology

## 2022-12-27 DIAGNOSIS — D3131 Benign neoplasm of right choroid: Secondary | ICD-10-CM | POA: Diagnosis not present

## 2022-12-27 DIAGNOSIS — H353211 Exudative age-related macular degeneration, right eye, with active choroidal neovascularization: Secondary | ICD-10-CM

## 2022-12-27 DIAGNOSIS — H353122 Nonexudative age-related macular degeneration, left eye, intermediate dry stage: Secondary | ICD-10-CM

## 2022-12-27 DIAGNOSIS — H4322 Crystalline deposits in vitreous body, left eye: Secondary | ICD-10-CM

## 2022-12-27 DIAGNOSIS — Z961 Presence of intraocular lens: Secondary | ICD-10-CM

## 2022-12-27 MED ORDER — AFLIBERCEPT 2MG/0.05ML IZ SOLN FOR KALEIDOSCOPE
2.0000 mg | INTRAVITREAL | Status: AC | PRN
  Administered 2022-12-27: 2 mg via INTRAVITREAL

## 2023-01-15 ENCOUNTER — Encounter (HOSPITAL_BASED_OUTPATIENT_CLINIC_OR_DEPARTMENT_OTHER): Payer: Self-pay | Admitting: Emergency Medicine

## 2023-01-15 ENCOUNTER — Ambulatory Visit (HOSPITAL_BASED_OUTPATIENT_CLINIC_OR_DEPARTMENT_OTHER)
Admission: EM | Admit: 2023-01-15 | Discharge: 2023-01-15 | Disposition: A | Discharge status: Home / Self Care | Payer: PPO | Attending: Internal Medicine | Admitting: Internal Medicine

## 2023-01-15 ENCOUNTER — Other Ambulatory Visit: Payer: Self-pay

## 2023-01-15 DIAGNOSIS — L600 Ingrowing nail: Secondary | ICD-10-CM

## 2023-01-15 MED ORDER — DOXYCYCLINE HYCLATE 100 MG PO CAPS: 100.0000 mg | ORAL_CAPSULE | Freq: Two times a day (BID) | ORAL | 0 refills | Status: AC

## 2023-01-15 NOTE — ED Provider Notes (Signed)
[facility]    CSN: [id number] Arrival date & time: 01/15/23  0347      History   Chief Complaint No chief complaint on file.   HPI Molly Ferrell is a 72 y.o. female.   TAJ DILS is a 72 y.o. female presenting for chief complaint of left second toe pain that she first noticed one week ago. She clipped her toenails approximately 1 week ago and started having mild pain to the distal left second toe. A blister appearing lesion developed to the end of the toe 3-4 days ago. Her husband stabbed the area of swelling and pus drainage came out. Pain, redness, and swelling has worsened over the last 2-3 days. Denies numbness/tingling distally, redness/streaking proximally to the left foot, and damage to the toenail. No recent trauma/injuries to the affected extremity or fevers/chills. Denies recent use of antibiotics, allergic to penicillins. She has not attempted use of any OTC medicines to help with symptoms PTA.      Past Medical History:  Diagnosis Date   Arthritis    Cancer (HCC) 07/2021   Right Breast Cancer-Stage 0; 1 month of radation per pt   Complication of anesthesia    "many years ago, had problems waking up"; patient stated no problems with recent surgeries 01/21/2018   GERD (gastroesophageal reflux disease)    Hard of hearing    History of bronchitis    Hypercholesteremia    Hypertriglyceridemia    Hypothyroidism    Macular degeneration of both eyes    OA (osteoarthritis)    severe right knee   Spondylolisthesis of lumbar region    Wears glasses     Patient Active Problem List   Diagnosis Date Noted   Lumbar adjacent segment disease with spondylolisthesis 01/25/2022   Ductal carcinoma in situ (DCIS) of right breast 08/09/2021    Class: Diagnosis of   Spinal stenosis of lumbar region with neurogenic claudication 03/03/2019   Spondylolisthesis of lumbar region 01/30/2018   S/P total knee replacement 06/12/2016   S/P revision of total  knee 06/14/2015    Past Surgical History:  Procedure Laterality Date   ABDOMINAL HYSTERECTOMY     APPENDECTOMY     BACK SURGERY     lumbar   BREAST LUMPECTOMY Right 07/2021   CATARACT EXTRACTION W/ INTRAOCULAR LENS IMPLANT Bilateral    COLONOSCOPY     ESOPHAGOGASTRODUODENOSCOPY     JOINT REPLACEMENT     bil hip replacements   KNEE ARTHROPLASTY Left    TONSILLECTOMY     TOTAL KNEE ARTHROPLASTY Right 06/12/2016   Procedure: TOTAL KNEE ARTHROPLASTY;  Surgeon: Dannielle Huh, MD;  Location: [facility] OR;  Service: Orthopedics;  Laterality: Right;   TOTAL KNEE REVISION Left 06/14/2015   Procedure: TOTAL KNEE REVISION POLY EXCHANGE;  Surgeon: Dannielle Huh, MD;  Location: [facility] OR;  Service: Orthopedics;  Laterality: Left;    OB History   No obstetric history on file.      Home Medications    Prior to Admission medications   Medication Sig Start Date End Date Taking? Authorizing Provider  doxycycline (VIBRAMYCIN) 100 MG capsule Take 1 capsule (100 mg total) by mouth 2 (two) times daily for 7 days. 01/15/23 01/22/23 Yes StanhopeDonavan Burnet, FNP  Ascorbic Acid (VITAMIN C) 1000 MG tablet Take 1,000 mg by mouth daily.    [provider]  aspirin EC 81 MG tablet Take 81 mg by mouth 2 (two) times daily. Swallow whole.    [provider]  atorvastatin (LIPITOR) 10 MG tablet Take 10 mg by mouth daily. 01/20/19   [provider]  cholecalciferol (VITAMIN D3) 25 MCG (1000 UNIT) tablet Take 1,000 Units by mouth in the morning and at bedtime.    [provider]  cyclobenzaprine (FLEXERIL) 5 MG tablet Take 1 tablet (5 mg total) by mouth 3 (three) times daily as needed for muscle spasms. 01/26/22   Tressie Stalker, MD  dicyclomine (BENTYL) 10 MG capsule Take 10 mg by mouth every 6 (six) hours as needed for spasms. 07/31/19   [provider]  docusate sodium (COLACE) 100 MG capsule Take 1 capsule (100 mg total) by mouth 2 (two) times daily. 01/26/22   Tressie Stalker, MD  fish oil-omega-3 fatty acids 1000 MG capsule Take 1 g by mouth daily.    [provider]  Lutein 20 MG TABS Take 20 mg by mouth 2 (two) times daily.    [provider]  MAGNESIUM GLYCINATE PO Take 350 mg by mouth at bedtime.    [provider]  Melatonin 5 MG TABS Take 5 mg by mouth at bedtime.    [provider]  niacin 500 MG tablet Take 500 mg by mouth 2 (two) times daily.     [provider]  omeprazole (PRILOSEC) 40 MG capsule Take 40 mg by mouth daily. 02/16/21   [provider]  oxyCODONE-acetaminophen (PERCOCET/ROXICET) 5-325 MG tablet Take 1-2 tablets by mouth every 4 (four) hours as needed for moderate pain. 01/26/22   Tressie Stalker, MD  PRESCRIPTION MEDICATION APPLY 0.5GM (2 CLICKS) TO LABIA ONCE A DAY. 12/26/21   [provider]  Testosterone POWD Apply 1 Dose topically daily. Testosterone Cream 2% - APPLY 0.5GM (2 CLICKS) TO LABIA ONCE A DAY.    [provider]  thyroid (ARMOUR) 60 MG tablet Take 60 mg by mouth daily before breakfast.    [provider]  vitamin E 400 UNIT capsule Take 400 Units by mouth daily.    [provider]    Family History Family History  Problem Relation Age of Onset   Heart disease Mother    Heart disease Father     Social History Social History   Tobacco Use   Smoking status: Former   Smokeless tobacco: Never   Tobacco comments:    "quit 10-15 years ago" 06/03/15  Vaping Use   Vaping status: Never Used  Substance Use Topics   Alcohol use: Yes    Alcohol/week: 2.0 standard drinks of alcohol    Types: 2 Glasses of wine per week    Comment: weekends   Drug use: No     Allergies   Penicillins   Review of Systems Review of Systems Per HPI  Physical Exam Triage Vital Signs ED Triage Vitals  Encounter Vitals Group     BP 01/15/23 0934 (!) 150/93     Systolic BP Percentile --      Diastolic BP Percentile --      Pulse Rate  01/15/23 0934 72     Resp 01/15/23 0934 14     Temp 01/15/23 0934 97.7 F (36.5 C)     Temp Source 01/15/23 0934 Oral     SpO2 01/15/23 0934 96 %     Weight --      Height --      Head Circumference --      Peak Flow --      Pain Score 01/15/23 0932 2  Pain Loc --      Pain Education --      Exclude from Growth Chart --    No data found.  Updated Vital Signs BP (!) 150/93 (BP Location: Right Arm)   Pulse 72   Temp 97.7 F (36.5 C) (Oral)   Resp 14   SpO2 96%   Visual Acuity Right Eye Distance:   Left Eye Distance:   Bilateral Distance:    Right Eye Near:   Left Eye Near:    Bilateral Near:     Physical Exam Vitals and nursing note reviewed.  Constitutional:      Appearance: She is not ill-appearing or toxic-appearing.  HENT:     Head: Normocephalic and atraumatic.     Right Ear: Hearing and external ear normal.     Left Ear: Hearing and external ear normal.     Nose: Nose normal.     Mouth/Throat:     Lips: Pink.  Eyes:     General: Lids are normal. Vision grossly intact. Gaze aligned appropriately.     Extraocular Movements: Extraocular movements intact.     Conjunctiva/sclera: Conjunctivae normal.  Pulmonary:     Effort: Pulmonary effort is normal.  Musculoskeletal:     Cervical back: Neck supple.       Feet:  Feet:     Comments: Significant tenderness to palpation of the distal left second toenail at site of swelling/induration (approx 0.5cm in diameter). Ingrown toenail to bilateral nail folds of left second toe. Cap refill less than 3, sensation and strength intact distally. +2 bilateral DP. Skin:    General: Skin is warm and dry.     Capillary Refill: Capillary refill takes less than 2 seconds.     Findings: No rash.  Neurological:     General: No focal deficit present.     Mental Status: She is alert and oriented to person, place, and time. Mental status is at baseline.     Cranial Nerves: No dysarthria or facial asymmetry.  Psychiatric:         Mood and Affect: Mood normal.        Speech: Speech normal.        Behavior: Behavior normal.        Thought Content: Thought content normal.        Judgment: Judgment normal.      UC Treatments / Results  Labs (all labs ordered are listed, but only abnormal results are displayed) Labs Reviewed - No data to display  EKG   Radiology No results found.  Procedures Procedures (including critical care time)  Medications Ordered in UC Medications - No data to display  Initial Impression / Assessment and Plan / UC Course  I have reviewed the triage vital signs and the nursing notes.  Pertinent labs & imaging results that were available during my care of the patient were reviewed by me and considered in my medical decision making (see chart for details).   1. Ingrown toenail of left foot with infection Presentation consistent with diagnosis above. Will treat with doxycycline BID for 7 days, epsom salt soaks, tylenol as needed for pain, and follow-up with podiatry. She is already established with a podiatrist and plans to call them to schedule an appointment for follow-up upon leaving urgent care.   Counseled patient on potential for adverse effects with medications prescribed/recommended today, strict ER and return-to-clinic precautions discussed, patient verbalized understanding.    Final Clinical Impressions(s) / UC Diagnoses  Final diagnoses:  Ingrown toenail of left foot with infection     Discharge Instructions      Your toenail is ingrown and this has caused an infection. Take doxycycline antibiotic every 12 hours for the next 7 days to treat infection. Use warm epsom salt soaks 3-4 times per day to reduce swelling and infection/pain. Tylenol as needed for pain. Schedule an appointment with your foot doctor ASAP.  If you develop any new or worsening symptoms or if your symptoms do not start to improve, please return here or follow-up with your primary care  provider. If your symptoms are severe, please go to the emergency room.    ED Prescriptions     Medication Sig Dispense Auth. Provider   doxycycline (VIBRAMYCIN) 100 MG capsule Take 1 capsule (100 mg total) by mouth 2 (two) times daily for 7 days. 14 capsule Carlisle Beers, FNP      PDMP not reviewed this encounter.   Carlisle Beers, [place] 01/15/23 1007

## 2023-01-15 NOTE — ED Triage Notes (Signed)
Patient c/o left middle toe pain x 3 weeks. States husband stuck it with a pin to "get some stuff out of it". States this happened after getting her toenails done.

## 2023-01-15 NOTE — Discharge Instructions (Signed)
Your toenail is ingrown and this has caused an infection. Take doxycycline antibiotic every 12 hours for the next 7 days to treat infection. Use warm epsom salt soaks 3-4 times per day to reduce swelling and infection/pain. Tylenol as needed for pain. Schedule an appointment with your foot doctor ASAP.  If you develop any new or worsening symptoms or if your symptoms do not start to improve, please return here or follow-up with your primary care provider. If your symptoms are severe, please go to the emergency room.

## 2023-01-23 NOTE — Progress Notes (Signed)
[facility] - Clinic Note  01/31/2023   CHIEF COMPLAINT Patient presents for Retina Follow Up  HISTORY OF PRESENT ILLNESS: Molly Ferrell is a 72 y.o. female who presents to the clinic today for:  HPI     Retina Follow Up   Patient presents with  Wet AMD.  In right eye.  This started 5 weeks ago.  Duration of 5 weeks.  Since onset it is stable.  I, the attending physician,  performed the HPI with the patient and updated documentation appropriately.        Comments   5 week retina follow up ARMD OD pt is reporting no vision changes noticed she has floaters without changes denies any flashes       Last edited by Rennis Chris, MD on 01/31/2023  5:15 PM.    Pt feels like her vision is not as good, she states she has to lean close to the mirror in order to see in it   Referring physician: Diona Foley, MD [address] [place],  [place] [address]  HISTORICAL INFORMATION:  Selected notes from the MEDICAL RECORD NUMBER Referred by Dr. Zenaida Niece for concern of exu ARMD OD LEE:  Ocular Hx- PMH-   CURRENT MEDICATIONS: No current outpatient medications on file. (Ophthalmic Drugs)   No current facility-administered medications for this visit. (Ophthalmic Drugs)   Current Outpatient Medications (Other)  Medication Sig   Ascorbic Acid (VITAMIN C) 1000 MG tablet Take 1,000 mg by mouth daily.   aspirin EC 81 MG tablet Take 81 mg by mouth 2 (two) times daily. Swallow whole.   atorvastatin (LIPITOR) 10 MG tablet Take 10 mg by mouth daily.   cholecalciferol (VITAMIN D3) 25 MCG (1000 UNIT) tablet Take 1,000 Units by mouth in the morning and at bedtime.   cyclobenzaprine (FLEXERIL) 5 MG tablet Take 1 tablet (5 mg total) by mouth 3 (three) times daily as needed for muscle spasms.   dicyclomine (BENTYL) 10 MG capsule Take 10 mg by mouth every 6 (six) hours as needed for spasms.   docusate sodium (COLACE) 100 MG capsule Take 1 capsule (100 mg total) by mouth 2 (two)  times daily.   fish oil-omega-3 fatty acids 1000 MG capsule Take 1 g by mouth daily.   Lutein 20 MG TABS Take 20 mg by mouth 2 (two) times daily.   MAGNESIUM GLYCINATE PO Take 350 mg by mouth at bedtime.   Melatonin 5 MG TABS Take 5 mg by mouth at bedtime.   niacin 500 MG tablet Take 500 mg by mouth 2 (two) times daily.    omeprazole (PRILOSEC) 40 MG capsule Take 40 mg by mouth daily.   oxyCODONE-acetaminophen (PERCOCET/ROXICET) 5-325 MG tablet Take 1-2 tablets by mouth every 4 (four) hours as needed for moderate pain.   PRESCRIPTION MEDICATION APPLY 0.5GM (2 CLICKS) TO LABIA ONCE A DAY.   Testosterone POWD Apply 1 Dose topically daily. Testosterone Cream 2% - APPLY 0.5GM (2 CLICKS) TO LABIA ONCE A DAY.   thyroid (ARMOUR) 60 MG tablet Take 60 mg by mouth daily before breakfast.   vitamin E 400 UNIT capsule Take 400 Units by mouth daily.   No current facility-administered medications for this visit. (Other)   REVIEW OF SYSTEMS: ROS   Positive for: Endocrine, Cardiovascular, Eyes Negative for: Constitutional, Gastrointestinal, Neurological, Skin, Musculoskeletal, HENT, Respiratory, Psychiatric, Allergic/Imm, Heme/Lymph Last edited by Etheleen Mayhew, COT on 01/31/2023  1:03 PM.        ALLERGIES  Allergies  Allergen Reactions   Penicillins Hives and Itching    CHILDHOOD reaction to Pen G Has patient had a PCN reaction causing immediate rash, facial/tongue/throat swelling, SOB or lightheadedness with hypotension: No Has patient had a PCN reaction causing severe rash involving mucus membranes or skin necrosis: No Has patient had a PCN reaction that required hospitalization: No Has patient had a PCN reaction occurring within the last 10 years: No If all of the above answers are "NO", then may proceed with Cephalosporin use.      PAST MEDICAL HISTORY Past Medical History:  Diagnosis Date   Arthritis    Cancer (HCC) 07/2021   Right Breast Cancer-Stage 0; 1 month of radation  per pt   Complication of anesthesia    "many years ago, had problems waking up"; patient stated no problems with recent surgeries 01/21/2018   GERD (gastroesophageal reflux disease)    Hard of hearing    History of bronchitis    Hypercholesteremia    Hypertriglyceridemia    Hypothyroidism    Macular degeneration of both eyes    OA (osteoarthritis)    severe right knee   Spondylolisthesis of lumbar region    Wears glasses    Past Surgical History:  Procedure Laterality Date   ABDOMINAL HYSTERECTOMY     APPENDECTOMY     BACK SURGERY     lumbar   BREAST LUMPECTOMY Right 07/2021   CATARACT EXTRACTION W/ INTRAOCULAR LENS IMPLANT Bilateral    COLONOSCOPY     ESOPHAGOGASTRODUODENOSCOPY     JOINT REPLACEMENT     bil hip replacements   KNEE ARTHROPLASTY Left    TONSILLECTOMY     TOTAL KNEE ARTHROPLASTY Right 06/12/2016   Procedure: TOTAL KNEE ARTHROPLASTY;  Surgeon: Dannielle Huh, MD;  Location: [facility] OR;  Service: Orthopedics;  Laterality: Right;   TOTAL KNEE REVISION Left 06/14/2015   Procedure: TOTAL KNEE REVISION POLY EXCHANGE;  Surgeon: Dannielle Huh, MD;  Location: [facility] OR;  Service: Orthopedics;  Laterality: Left;   FAMILY HISTORY Family History  Problem Relation Age of Onset   Heart disease Mother    Heart disease Father    SOCIAL HISTORY Social History   Tobacco Use   Smoking status: Former   Smokeless tobacco: Never   Tobacco comments:    "quit 10-15 years ago" 06/03/15  Vaping Use   Vaping status: Never Used  Substance Use Topics   Alcohol use: Yes    Alcohol/week: 2.0 standard drinks of alcohol    Types: 2 Glasses of wine per week    Comment: weekends   Drug use: No       OPHTHALMIC EXAM:  Base Eye Exam     Visual Acuity (Snellen - Linear)       Right Left   Dist [place] 20/30 -2 20/40 -2   Dist ph [place] NI NI         Tonometry (Tonopen, 1:07 PM)       Right Left   Pressure 14 16         Pupils       Pupils Dark Light Shape React APD   Right PERRL 3  2 Round Brisk None   Left PERRL 3 2 Round Brisk None         Visual Fields       Left Right    Full Full         Extraocular Movement       Right Left    Full,  Ortho Full, Ortho         Neuro/Psych     Oriented x3: Yes   Mood/Affect: Normal         Dilation     Both eyes: 2.5% Phenylephrine @ 1:07 PM           Slit Lamp and Fundus Exam     Slit Lamp Exam       Right Left   Lids/Lashes Dermatochalasis - upper lid Dermatochalasis - upper lid   Conjunctiva/Sclera White and quiet White and quiet   Cornea mild arcus, well healed cataract wound mild arcus, well healed cataract wound   Anterior Chamber deep and clear deep and clear   Iris Round and dilated Round and dilated   Lens PC IOL in good position PC IOL in good position   Anterior Vitreous Vitreous syneresis Vitreous syneresis, mild asteroid hyalosis         Fundus Exam       Right Left   Disc Pink and Sharp, +PPP Pink and Sharp, mild PPA   C/D Ratio 0.4 0.2   Macula Flat, Blunted foveal reflex, refractile drusen, +CNV with shallow SRF IT macula -- stably resolved, cystic changes -- stably resolved, no frank heme, pigmented lesion along proximal ST arcades (approx 2DD) Flat, Blunted foveal reflex, refractile drusen, RPE mottling, clumping and early atrophy, No heme or edema   Vessels attenuated, Tortuous attenuated, mild tortuosity   Periphery Attached, No heme Attached, No heme           IMAGING AND PROCEDURES  Imaging and Procedures for 01/31/2023  OCT, Retina - OU - Both Eyes       Right Eye Quality was good. Central Foveal Thickness: 236. Progression has been stable. Findings include no IRF, no SRF, abnormal foveal contour, retinal drusen , subretinal hyper-reflective material, pigment epithelial detachment (Stable resolution of IRF/cystic changes temporal macula, stable resolution of shallow SRF overlying PEDs IT macula and fovea, Partial PVD).   Left Eye Quality was good. Central  Foveal Thickness: 226. Progression has been stable. Findings include normal foveal contour, no IRF, no SRF, retinal drusen , subretinal hyper-reflective material, pigment epithelial detachment, outer retinal atrophy (Scattered drusen with patchy ORA, partial PVD).   Notes *Images captured and stored on drive  Diagnosis / Impression:  OD: exu ARMD -- Stable resolution of IRF/cystic changes temporal macula, stable resolution of shallow SRF overlying PEDs IT macula and fovea, Partial PVD OS: non-exu ARMD -- Scattered drusen with patchy ORA, partial PVD  Clinical management:  See below  Abbreviations: NFP - Normal foveal profile. CME - cystoid macular edema. PED - pigment epithelial detachment. IRF - intraretinal fluid. SRF - subretinal fluid. EZ - ellipsoid zone. ERM - epiretinal membrane. ORA - outer retinal atrophy. ORT - outer retinal tubulation. SRHM - subretinal hyper-reflective material. IRHM - intraretinal hyper-reflective material      Intravitreal Injection, Pharmacologic Agent - OD - Right Eye       Time Out 01/31/2023. 1:51 PM. Confirmed correct patient, procedure, site, and patient consented.   Anesthesia Topical anesthesia was used. Anesthetic medications included Lidocaine 2%, Proparacaine 0.5%.   Procedure Preparation included 5% betadine to ocular surface, eyelid speculum. A (32g) needle was used.   Injection: 2 mg aflibercept 2 MG/0.05ML   Route: Intravitreal, Site: Right Eye   NDC: [id number], Lot: [id number], Expiration date: 07/28/2023, Waste: 0 mL   Post-op Post injection exam found visual acuity of at least counting fingers. The patient tolerated the procedure  well. There were no complications. The patient received written and verbal post procedure care education.           ASSESSMENT/PLAN:   ICD-10-CM   1. Exudative age-related macular degeneration of right eye with active choroidal neovascularization (HCC)  H35.3211 OCT, Retina - OU - Both Eyes     Intravitreal Injection, Pharmacologic Agent - OD - Right Eye    aflibercept (EYLEA) SOLN 2 mg    2. Intermediate stage nonexudative age-related macular degeneration of left eye  H35.3122     3. Asteroid hyalosis of left eye  H43.22     4. Nevus of choroid of right eye  D31.31     5. Pseudophakia, both eyes  Z96.1      Exudative age related macular degeneration, OD  - s/p IVA OD #1 (05.08.24), #2 (06.05.24), #3 (07.03.24), #4 (07.31.24), #5 (08.28.24)  - s/p IVE OD #1 (09.26.24), #2 (10.30.24)  - BCVA 20/30 OD -- stable  - OCT OD shows OD:  Stable resolution of IRF/cystic changes temporal macula, stable resolution of shallow SRF overlying PEDs IT macula and fovea, Partial PVD at 5 wks  - recommend IVE OD #3 today, 12.04.24 w/ f/u extended to 7 wks - pt wishes to proceed with injection - RBA of procedure discussed, questions answered - IVE informed consent obtained and signed, 09.26.24 - see procedure note  - Eylea authorization approved  - f/u in 7 wks -- DFE/OCT, possible injection  2. Age related macular degeneration, non-exudative, left eye  - The incidence, anatomy, and pathology of dry AMD, risk of progression, and the AREDS and AREDS 2 study including smoking risks discussed with patient.  - Recommend amsler grid monitoring - OCT OS: non-exu ARMD -- Scattered drusen with patchy ORA, partial PVD  - f/u 3 months, DFE, OCT  3. Asteroid hyalosis OS  4. Choroidal Nevus, OD  - pigmented lesion along proximal arcades  - no visual symptoms, SRF or orange pigment  - +drusen  - thickness < 2mm  - discussed findings, prognosis  - monitoring  5. Pseudophakia OU  - s/p CE/IOL OU (CEA, [place], [place])  - IOL in good position, doing well  - monitor  Ophthalmic Meds Ordered this visit:  Meds ordered this encounter  Medications   aflibercept (EYLEA) SOLN 2 mg     Return in about 7 weeks (around 03/21/2023) for f/u exu ARMD OD, DFE, OCT.  There are no Patient Instructions on  file for this visit.  Explained the diagnoses, plan, and follow up with the patient and they expressed understanding.  Patient expressed understanding of the importance of proper follow up care.   This document serves as a record of services personally performed by Karie Chimera, MD, PhD. It was created on their behalf by De Blanch, an ophthalmic technician. The creation of this record is the provider's dictation and/or activities during the visit.    Electronically signed by: De Blanch, OA, 02/02/23  9:56 PM  This document serves as a record of services personally performed by Karie Chimera, MD, PhD. It was created on their behalf by Glee Arvin. Manson Passey, OA an ophthalmic technician. The creation of this record is the provider's dictation and/or activities during the visit.    Electronically signed by: Glee Arvin. Manson Passey, OA 02/02/23 9:56 PM  Karie Chimera, M.D., Ph.D. Diseases & Surgery of the Retina and Vitreous [facility] [place] 01/31/2023  I have reviewed the above documentation for accuracy and  completeness, and I agree with the above. Karie Chimera, M.D., Ph.D. 02/02/23 9:58 PM   Abbreviations: M myopia (nearsighted); A astigmatism; H hyperopia (farsighted); P presbyopia; Mrx spectacle prescription;  CTL contact lenses; OD right eye; OS left eye; OU both eyes  XT exotropia; ET esotropia; PEK punctate epithelial keratitis; PEE punctate epithelial erosions; DES dry eye syndrome; MGD meibomian gland dysfunction; ATs artificial tears; PFAT's preservative free artificial tears; NSC nuclear sclerotic cataract; PSC posterior subcapsular cataract; ERM epi-retinal membrane; PVD posterior vitreous detachment; RD retinal detachment; DM diabetes mellitus; DR diabetic retinopathy; NPDR non-proliferative diabetic retinopathy; PDR proliferative diabetic retinopathy; CSME clinically significant macular edema; DME diabetic macular edema; dbh dot blot hemorrhages; CWS cotton  wool spot; POAG primary open angle glaucoma; C/D cup-to-disc ratio; HVF humphrey visual field; GVF goldmann visual field; OCT optical coherence tomography; IOP intraocular pressure; BRVO Branch retinal vein occlusion; CRVO central retinal vein occlusion; CRAO central retinal artery occlusion; BRAO branch retinal artery occlusion; RT retinal tear; SB scleral buckle; PPV pars plana vitrectomy; VH Vitreous hemorrhage; PRP panretinal laser photocoagulation; IVK intravitreal kenalog; VMT vitreomacular traction; MH Macular hole;  NVD neovascularization of the disc; NVE neovascularization elsewhere; AREDS age related eye disease study; ARMD age related macular degeneration; POAG primary open angle glaucoma; EBMD epithelial/anterior basement membrane dystrophy; ACIOL anterior chamber intraocular lens; IOL intraocular lens; PCIOL posterior chamber intraocular lens; Phaco/IOL phacoemulsification with intraocular lens placement; PRK photorefractive keratectomy; LASIK laser assisted in situ keratomileusis; HTN hypertension; DM diabetes mellitus; COPD chronic obstructive pulmonary disease

## 2023-01-31 ENCOUNTER — Ambulatory Visit (INDEPENDENT_AMBULATORY_CARE_PROVIDER_SITE_OTHER): Payer: PPO | Admitting: Ophthalmology

## 2023-01-31 ENCOUNTER — Encounter (INDEPENDENT_AMBULATORY_CARE_PROVIDER_SITE_OTHER): Payer: Self-pay | Admitting: Ophthalmology

## 2023-01-31 DIAGNOSIS — H4322 Crystalline deposits in vitreous body, left eye: Secondary | ICD-10-CM | POA: Diagnosis not present

## 2023-01-31 DIAGNOSIS — H353211 Exudative age-related macular degeneration, right eye, with active choroidal neovascularization: Secondary | ICD-10-CM

## 2023-01-31 DIAGNOSIS — H353122 Nonexudative age-related macular degeneration, left eye, intermediate dry stage: Secondary | ICD-10-CM | POA: Diagnosis not present

## 2023-01-31 DIAGNOSIS — D3131 Benign neoplasm of right choroid: Secondary | ICD-10-CM

## 2023-01-31 DIAGNOSIS — Z961 Presence of intraocular lens: Secondary | ICD-10-CM

## 2023-01-31 MED ORDER — AFLIBERCEPT 2MG/0.05ML IZ SOLN FOR KALEIDOSCOPE
2.0000 mg | INTRAVITREAL | Status: AC | PRN
  Administered 2023-01-31: 2 mg via INTRAVITREAL

## 2023-02-25 ENCOUNTER — Ambulatory Visit (HOSPITAL_BASED_OUTPATIENT_CLINIC_OR_DEPARTMENT_OTHER)
Admission: EM | Admit: 2023-02-25 | Discharge: 2023-02-25 | Disposition: A | Discharge status: Home / Self Care | Payer: PPO | Attending: Internal Medicine | Admitting: Internal Medicine

## 2023-02-25 ENCOUNTER — Encounter (HOSPITAL_BASED_OUTPATIENT_CLINIC_OR_DEPARTMENT_OTHER): Payer: Self-pay | Admitting: Emergency Medicine

## 2023-02-25 DIAGNOSIS — J209 Acute bronchitis, unspecified: Secondary | ICD-10-CM | POA: Diagnosis not present

## 2023-02-25 MED ORDER — PROMETHAZINE-DM 6.25-15 MG/5ML PO SYRP: 5.0000 mL | ORAL_SOLUTION | Freq: Four times a day (QID) | ORAL | 0 refills | Status: DC | PRN

## 2023-02-25 NOTE — ED Triage Notes (Signed)
Pt c/o cough and congestion x1 week.  

## 2023-02-25 NOTE — ED Provider Notes (Signed)
[name]    CSN: [id number] Arrival date & time: 02/25/23  1093      History   Chief Complaint Chief Complaint  Patient presents with   Cough    HPI Molly Ferrell is a 72 y.o. female.    Cough Associated symptoms: no chills, no fever, no shortness of breath and no wheezing   Cough for 6 days nonproductive.  Admits soreness in her ribs from coughing.  States cough is interfering with sleep. Denies rhinorrhea, nasal congestion, sore throat, fever, chills, sweats, chest pain, shortness of breath, nausea, vomiting, diarrhea Admits daughter and granddaughter have been sick 1 has a throat infection 1 has an ear infection.  Denies confirmed COVID or flu exposures Denies history of asthma, bronchitis or pneumonia  Past Medical History:  Diagnosis Date   Arthritis    Cancer (HCC) 07/2021   Right Breast Cancer-Stage 0; 1 month of radation per pt   Complication of anesthesia    "many years ago, had problems waking up"; patient stated no problems with recent surgeries 01/21/2018   GERD (gastroesophageal reflux disease)    Hard of hearing    History of bronchitis    Hypercholesteremia    Hypertriglyceridemia    Hypothyroidism    Macular degeneration of both eyes    OA (osteoarthritis)    severe right knee   Spondylolisthesis of lumbar region    Wears glasses     Patient Active Problem List   Diagnosis Date Noted   Lumbar adjacent segment disease with spondylolisthesis 01/25/2022   Ductal carcinoma in situ (DCIS) of right breast 08/09/2021    Class: Diagnosis of   Spinal stenosis of lumbar region with neurogenic claudication 03/03/2019   Spondylolisthesis of lumbar region 01/30/2018   S/P total knee replacement 06/12/2016   S/P revision of total knee 06/14/2015    Past Surgical History:  Procedure Laterality Date   ABDOMINAL HYSTERECTOMY     APPENDECTOMY     BACK SURGERY     lumbar   BREAST LUMPECTOMY Right 07/2021   CATARACT EXTRACTION W/  INTRAOCULAR LENS IMPLANT Bilateral    COLONOSCOPY     ESOPHAGOGASTRODUODENOSCOPY     JOINT REPLACEMENT     bil hip replacements   KNEE ARTHROPLASTY Left    TONSILLECTOMY     TOTAL KNEE ARTHROPLASTY Right 06/12/2016   Procedure: TOTAL KNEE ARTHROPLASTY;  Surgeon: Dannielle Huh, MD;  Location: [facility];  Service: Orthopedics;  Laterality: Right;   TOTAL KNEE REVISION Left 06/14/2015   Procedure: TOTAL KNEE REVISION POLY EXCHANGE;  Surgeon: Dannielle Huh, MD;  Location: [facility];  Service: Orthopedics;  Laterality: Left;    OB History   No obstetric history on file.      Home Medications    Prior to Admission medications   Medication Sig Start Date End Date Taking? Authorizing Provider  Ascorbic Acid (VITAMIN C) 1000 MG tablet Take 1,000 mg by mouth daily.    [provider]  aspirin EC 81 MG tablet Take 81 mg by mouth 2 (two) times daily. Swallow whole.    [provider]  atorvastatin (LIPITOR) 10 MG tablet Take 10 mg by mouth daily. 01/20/19   [provider]  cholecalciferol (VITAMIN D3) 25 MCG (1000 UNIT) tablet Take 1,000 Units by mouth in the morning and at bedtime.    [provider]  cyclobenzaprine (FLEXERIL) 5 MG tablet Take 1 tablet (5 mg total) by mouth 3 (three) times daily as needed for muscle spasms.  01/26/22   Tressie Stalker, MD  dicyclomine (BENTYL) 10 MG capsule Take 10 mg by mouth every 6 (six) hours as needed for spasms. 07/31/19   [provider]  docusate sodium (COLACE) 100 MG capsule Take 1 capsule (100 mg total) by mouth 2 (two) times daily. 01/26/22   Tressie Stalker, MD  fish oil-omega-3 fatty acids 1000 MG capsule Take 1 g by mouth daily.    [provider]  Lutein 20 MG TABS Take 20 mg by mouth 2 (two) times daily.    [provider]  MAGNESIUM GLYCINATE PO Take 350 mg by mouth at bedtime.    [provider]  Melatonin 5 MG TABS Take 5 mg by mouth at bedtime.    [provider]  niacin  500 MG tablet Take 500 mg by mouth 2 (two) times daily.     [provider]  omeprazole (PRILOSEC) 40 MG capsule Take 40 mg by mouth daily. 02/16/21   [provider]  oxyCODONE-acetaminophen (PERCOCET/ROXICET) 5-325 MG tablet Take 1-2 tablets by mouth every 4 (four) hours as needed for moderate pain. 01/26/22   Tressie Stalker, MD  PRESCRIPTION MEDICATION APPLY 0.5GM (2 CLICKS) TO LABIA ONCE A DAY. 12/26/21   [provider]  Testosterone POWD Apply 1 Dose topically daily. Testosterone Cream 2% - APPLY 0.5GM (2 CLICKS) TO LABIA ONCE A DAY.    [provider]  thyroid (ARMOUR) 60 MG tablet Take 60 mg by mouth daily before breakfast.    [provider]  vitamin E 400 UNIT capsule Take 400 Units by mouth daily.    [provider]    Family History Family History  Problem Relation Age of Onset   Heart disease Mother    Heart disease Father     Social History Social History   Tobacco Use   Smoking status: Former   Smokeless tobacco: Never   Tobacco comments:    "quit 10-15 years ago" 06/03/15  Vaping Use   Vaping status: Never Used  Substance Use Topics   Alcohol use: Yes    Alcohol/week: 2.0 standard drinks of alcohol    Types: 2 Glasses of wine per week    Comment: weekends   Drug use: No     Allergies   Penicillins   Review of Systems Review of Systems  Constitutional:  Negative for chills, fatigue and fever.  Respiratory:  Positive for cough. Negative for shortness of breath and wheezing.   Gastrointestinal:  Negative for diarrhea, nausea and vomiting.     Physical Exam Triage Vital Signs ED Triage Vitals  Encounter Vitals Group     BP 02/25/23 0855 125/86     Systolic BP Percentile --      Diastolic BP Percentile --      Pulse Rate 02/25/23 0855 95     Resp 02/25/23 0855 18     Temp 02/25/23 0855 98.1 F (36.7 C)     Temp Source 02/25/23 0855 Oral     SpO2 02/25/23 0855 95 %     Weight --      Height --       Head Circumference --      Peak Flow --      Pain Score 02/25/23 0853 0     Pain Loc --      Pain Education --      Exclude from Growth Chart --    No data found.  Updated Vital Signs BP 125/86 (BP Location:  Right Arm)   Pulse 95   Temp 98.1 F (36.7 C) (Oral)   Resp 18   SpO2 95%   Visual Acuity Right Eye Distance:   Left Eye Distance:   Bilateral Distance:    Right Eye Near:   Left Eye Near:    Bilateral Near:     Physical Exam Vitals reviewed.  Constitutional:      Appearance: She is not ill-appearing.  HENT:     Head: Normocephalic and atraumatic.     Right Ear: Tympanic membrane and ear canal normal.     Left Ear: Tympanic membrane and ear canal normal.     Nose: No rhinorrhea.     Mouth/Throat:     Mouth: Mucous membranes are moist.     Pharynx: Oropharynx is clear.  Eyes:     Conjunctiva/sclera: Conjunctivae normal.  Cardiovascular:     Rate and Rhythm: Normal rate and regular rhythm.     Heart sounds: Normal heart sounds.  Pulmonary:     Effort: Pulmonary effort is normal. No respiratory distress.     Breath sounds: Normal breath sounds. No wheezing or rales.  Musculoskeletal:     Cervical back: Neck supple.  Lymphadenopathy:     Cervical: No cervical adenopathy.  Skin:    General: Skin is warm.  Neurological:     Mental Status: She is alert.      UC Treatments / Results  Labs (all labs ordered are listed, but only abnormal results are displayed) Labs Reviewed - No data to display  EKG   Radiology No results found.  Procedures Procedures (including critical care time)  Medications Ordered in UC Medications - No data to display  Initial Impression / Assessment and Plan / UC Course  I have reviewed the triage vital signs and the nursing notes.  Pertinent labs & imaging results that were available during my care of the patient were reviewed by me and considered in my medical decision making (see chart for  details).  72 year old female with nonproductive cough for 6 days, she is well-appearing, nontoxic, afebrile, her lungs are clear to auscultation, discussed with patient likely viral bronchitis Rx cough medicine follow-up with PCP Final Clinical Impressions(s) / UC Diagnoses   Final diagnoses:  None   Discharge Instructions   None    ED Prescriptions   None    PDMP not reviewed this encounter.   Meliton Rattan, [place] 02/25/23 [phone]

## 2023-03-01 ENCOUNTER — Encounter (HOSPITAL_BASED_OUTPATIENT_CLINIC_OR_DEPARTMENT_OTHER): Payer: Self-pay | Admitting: Emergency Medicine

## 2023-03-01 ENCOUNTER — Ambulatory Visit (HOSPITAL_BASED_OUTPATIENT_CLINIC_OR_DEPARTMENT_OTHER)
Admission: EM | Admit: 2023-03-01 | Discharge: 2023-03-01 | Disposition: A | Discharge status: Home / Self Care | Payer: PPO | Attending: Family Medicine | Admitting: Family Medicine

## 2023-03-01 DIAGNOSIS — J22 Unspecified acute lower respiratory infection: Secondary | ICD-10-CM

## 2023-03-01 MED ORDER — AZITHROMYCIN 250 MG PO TABS: 250.0000 mg | ORAL_TABLET | Freq: Every day | ORAL | 0 refills | Status: DC

## 2023-03-01 NOTE — ED Provider Notes (Signed)
 Molly Ferrell [facility]    CSN: [id number] Arrival date & time: 03/01/23  1506      History   Chief Complaint No chief complaint on file.   HPI Molly Ferrell is a 73 y.o. female.   HPI Cough for more than 10 days, of yellow to green sputum, worse at night, interferes with sleep.  Admits some soreness in her back from coughing.  Has had some night sweats.  Admits mild nasal congestion. Denies documented fever, chills, body aches, fatigue, body aches, fatigue, ear pain, nausea, vomiting, diarrhea, chest pain, shortness of breath.  Seen here 4 days ago for same given Promethazine  DM, states no improvement.  Denies COVID or flu exposures.  Past Medical History:  Diagnosis Date   Arthritis    Cancer (HCC) 07/2021   Right Breast Cancer-Stage 0; 1 month of radation per pt   Complication of anesthesia    many years ago, had problems waking up; patient stated no problems with recent surgeries 01/21/2018   GERD (gastroesophageal reflux disease)    Hard of hearing    History of bronchitis    Hypercholesteremia    Hypertriglyceridemia    Hypothyroidism    Macular degeneration of both eyes    OA (osteoarthritis)    severe right knee   Spondylolisthesis of lumbar region    Wears glasses     Patient Active Problem List   Diagnosis Date Noted   Lumbar adjacent segment disease with spondylolisthesis 01/25/2022   Ductal carcinoma in situ (DCIS) of right breast 08/09/2021    Class: Diagnosis of   Spinal stenosis of lumbar region with neurogenic claudication 03/03/2019   Spondylolisthesis of lumbar region 01/30/2018   S/P total knee replacement 06/12/2016   S/P revision of total knee 06/14/2015    Past Surgical History:  Procedure Laterality Date   ABDOMINAL HYSTERECTOMY     APPENDECTOMY     BACK SURGERY     lumbar   BREAST LUMPECTOMY Right 07/2021   CATARACT EXTRACTION W/ INTRAOCULAR LENS IMPLANT Bilateral    COLONOSCOPY     ESOPHAGOGASTRODUODENOSCOPY     JOINT  REPLACEMENT     bil hip replacements   KNEE ARTHROPLASTY Left    TONSILLECTOMY     TOTAL KNEE ARTHROPLASTY Right 06/12/2016   Procedure: TOTAL KNEE ARTHROPLASTY;  Surgeon: Marcey Raman, MD;  Location: [facility] OR;  Service: Orthopedics;  Laterality: Right;   TOTAL KNEE REVISION Left 06/14/2015   Procedure: TOTAL KNEE REVISION POLY EXCHANGE;  Surgeon: Marcey Raman, MD;  Location: [facility] OR;  Service: Orthopedics;  Laterality: Left;    OB History   No obstetric history on file.      Home Medications    Prior to Admission medications   Medication Sig Start Date End Date Taking? Authorizing Provider  Ascorbic Acid (VITAMIN C) 1000 MG tablet Take 1,000 mg by mouth daily.    [provider]  aspirin  EC 81 MG tablet Take 81 mg by mouth 2 (two) times daily. Swallow whole.    [provider]  atorvastatin  (LIPITOR) 10 MG tablet Take 10 mg by mouth daily. 01/20/19   [provider]  cholecalciferol  (VITAMIN D3) 25 MCG (1000 UNIT) tablet Take 1,000 Units by mouth in the morning and at bedtime.    [provider]  cyclobenzaprine  (FLEXERIL ) 5 MG tablet Take 1 tablet (5 mg total) by mouth 3 (three) times daily as needed for muscle spasms. 01/26/22   Molly Purchase, MD  dicyclomine  (BENTYL ) 10 MG capsule  Take 10 mg by mouth every 6 (six) hours as needed for spasms. 07/31/19   [provider]  docusate sodium  (COLACE) 100 MG capsule Take 1 capsule (100 mg total) by mouth 2 (two) times daily. 01/26/22   Molly Purchase, MD  fish oil-omega-3 fatty acids 1000 MG capsule Take 1 g by mouth daily.    [provider]  Lutein  20 MG TABS Take 20 mg by mouth 2 (two) times daily.    [provider]  MAGNESIUM GLYCINATE PO Take 350 mg by mouth at bedtime.    [provider]  Melatonin 5 MG TABS Take 5 mg by mouth at bedtime.    [provider]  niacin  500 MG tablet Take 500 mg by mouth 2 (two) times daily.     [provider]   omeprazole (PRILOSEC) 40 MG capsule Take 40 mg by mouth daily. 02/16/21   [provider]  oxyCODONE -acetaminophen  (PERCOCET/ROXICET) 5-325 MG tablet Take 1-2 tablets by mouth every 4 (four) hours as needed for moderate pain. 01/26/22   Molly Purchase, MD  PRESCRIPTION MEDICATION APPLY 0.5GM (2 CLICKS) TO LABIA ONCE A DAY. 12/26/21   [provider]  promethazine -dextromethorphan (PROMETHAZINE -DM) 6.25-15 MG/5ML syrup Take 5 mLs by mouth 4 (four) times daily as needed for cough. 02/25/23   Molly Ramella, PA  Testosterone  POWD Apply 1 Dose topically daily. Testosterone  Cream 2% - APPLY 0.5GM (2 CLICKS) TO LABIA ONCE A DAY.    [provider]  thyroid  (ARMOUR) 60 MG tablet Take 60 mg by mouth daily before breakfast.    [provider]  vitamin E 400 UNIT capsule Take 400 Units by mouth daily.    [provider]    Family History Family History  Problem Relation Age of Onset   Heart disease Mother    Heart disease Father     Social History Social History   Tobacco Use   Smoking status: Former   Smokeless tobacco: Never   Tobacco comments:    quit 10-15 years ago 06/03/15  Vaping Use   Vaping status: Never Used  Substance Use Topics   Alcohol  use: Yes    Alcohol /week: 2.0 standard drinks of alcohol     Types: 2 Glasses of wine per week    Comment: weekends   Drug use: No     Allergies   Penicillins   Review of Systems Review of Systems  Constitutional:  Positive for chills and fatigue. Negative for fever.  HENT:  Positive for congestion. Negative for ear pain, rhinorrhea and sinus pain.   Respiratory:  Positive for cough. Negative for chest tightness and shortness of breath.   Cardiovascular:  Negative for chest pain.  Gastrointestinal:  Negative for abdominal pain, diarrhea, nausea and vomiting.  Musculoskeletal:  Negative for back pain.  Neurological:  Negative for dizziness and headaches.     Physical Exam Triage  Vital Signs ED Triage Vitals  Encounter Vitals Group     BP 03/01/23 1519 121/78     Systolic BP Percentile --      Diastolic BP Percentile --      Pulse Rate 03/01/23 1519 80     Resp 03/01/23 1519 20     Temp 03/01/23 1519 98.2 F (36.8 C)     Temp Source 03/01/23 1519 Oral     SpO2 03/01/23 1519 96 %     Weight --      Height --      Head Circumference --  Peak Flow --      Pain Score 03/01/23 1518 0     Pain Loc --      Pain Education --      Exclude from Growth Chart --    No data found.  Updated Vital Signs BP 121/78 (BP Location: Right Arm)   Pulse 80   Temp 98.2 F (36.8 C) (Oral)   Resp 20   SpO2 96%   Visual Acuity Right Eye Distance:   Left Eye Distance:   Bilateral Distance:    Right Eye Near:   Left Eye Near:    Bilateral Near:     Physical Exam Vitals and nursing note reviewed.  Constitutional:      Appearance: She is not ill-appearing.  HENT:     Head: Normocephalic and atraumatic.     Right Ear: Tympanic membrane and ear canal normal.     Left Ear: Tympanic membrane normal.     Nose: No rhinorrhea.     Mouth/Throat:     Mouth: Mucous membranes are moist.     Pharynx: Oropharynx is clear. No oropharyngeal exudate or posterior oropharyngeal erythema.  Eyes:     Conjunctiva/sclera: Conjunctivae normal.  Cardiovascular:     Rate and Rhythm: Normal rate and regular rhythm.     Heart sounds: Normal heart sounds.  Pulmonary:     Effort: Pulmonary effort is normal. No respiratory distress.     Breath sounds: Normal breath sounds. No wheezing, rhonchi or rales.     Comments: Coughing frequently Musculoskeletal:     Cervical back: Neck supple.  Lymphadenopathy:     Cervical: No cervical adenopathy.  Skin:    General: Skin is warm.  Neurological:     Mental Status: She is alert and oriented to person, place, and time.      UC Treatments / Results  Labs (all labs ordered are listed, but only abnormal results are displayed) Labs  Reviewed - No data to display  EKG   Radiology No results found.  Procedures Procedures (including critical care time)  Medications Ordered in UC Medications - No data to display  Initial Impression / Assessment and Plan / UC Course  I have reviewed the triage vital signs and the nursing notes.  Pertinent labs & imaging results that were available during my care of the patient were reviewed by me and considered in my medical decision making (see chart for details).     73 year old female with productive cough for 10 days no improvement since her visit 4 days ago.  She has noted to be coughing frequently however is well-appearing, she is afebrile and nontoxic, her lungs are clear to auscultation Discussed with patient we will treat with a course of antibiotics to cover atypical lower respiratory tract infections however as her symptoms worsen she should be seen somewhere where she can have an x-ray done.  Can take over-the-counter Robitussin and Mucinex as directed on the package, rest, increase fluids.  Go to ED for worsening symptoms including development of fever, chest pain, shortness of breath, wheezing, concerns Final Clinical Impressions(s) / UC Diagnoses   Final diagnoses:  None   Discharge Instructions   None    ED Prescriptions   None    PDMP not reviewed this encounter.   Samanthia Howland, [place] 03/01/23 1537

## 2023-03-01 NOTE — ED Triage Notes (Signed)
 Pt no better since 12/29 still coughing even after taking cough medication.

## 2023-03-01 NOTE — Discharge Instructions (Signed)
 Take the antibiotics as written Over-the-counter Robitussin DM and Mucinex Follow-up with your doctor in 5 days Increase fluid intake Go to the emergency department for worsening symptoms including fever, chest pain, shortness of breath, wheezing, concerns

## 2023-03-15 NOTE — Progress Notes (Signed)
[facility] - Clinic Note  03/21/2023   CHIEF COMPLAINT Patient presents for Retina Follow Up  HISTORY OF PRESENT ILLNESS: Molly Ferrell is a 73 y.o. female who presents to the clinic today for:  HPI     Retina Follow Up   Patient presents with  Wet AMD.  In right eye.  This started 7 weeks ago.  I, the attending physician,  performed the HPI with the patient and updated documentation appropriately.        Comments   Patient here for 7 weeks retina follow up for exu ARMD OD. Patient states vision doing fine. OS can't see very well out of it. No eye pain. Eyes get tired feeling.      Last edited by Rennis Chris, MD on 03/21/2023  5:45 PM.    Pt feels    Referring physician: Diona Foley, MD [address] [place],  [place] [address]  HISTORICAL INFORMATION:  Selected notes from the MEDICAL RECORD NUMBER Referred by Dr. Zenaida Niece for concern of exu ARMD OD LEE:  Ocular Hx- PMH-   CURRENT MEDICATIONS: No current outpatient medications on file. (Ophthalmic Drugs)   No current facility-administered medications for this visit. (Ophthalmic Drugs)   Current Outpatient Medications (Other)  Medication Sig   Ascorbic Acid (VITAMIN C) 1000 MG tablet Take 1,000 mg by mouth daily.   aspirin EC 81 MG tablet Take 81 mg by mouth 2 (two) times daily. Swallow whole.   atorvastatin (LIPITOR) 10 MG tablet Take 10 mg by mouth daily.   azithromycin (ZITHROMAX) 250 MG tablet Take 1 tablet (250 mg total) by mouth daily. Take first 2 tablets together, then 1 every day until finished.   cholecalciferol (VITAMIN D3) 25 MCG (1000 UNIT) tablet Take 1,000 Units by mouth in the morning and at bedtime.   cyclobenzaprine (FLEXERIL) 5 MG tablet Take 1 tablet (5 mg total) by mouth 3 (three) times daily as needed for muscle spasms.   dicyclomine (BENTYL) 10 MG capsule Take 10 mg by mouth every 6 (six) hours as needed for spasms.   docusate sodium (COLACE) 100 MG capsule Take 1  capsule (100 mg total) by mouth 2 (two) times daily.   fish oil-omega-3 fatty acids 1000 MG capsule Take 1 g by mouth daily.   Lutein 20 MG TABS Take 20 mg by mouth 2 (two) times daily.   MAGNESIUM GLYCINATE PO Take 350 mg by mouth at bedtime.   Melatonin 5 MG TABS Take 5 mg by mouth at bedtime.   niacin 500 MG tablet Take 500 mg by mouth 2 (two) times daily.    omeprazole (PRILOSEC) 40 MG capsule Take 40 mg by mouth daily.   oxyCODONE-acetaminophen (PERCOCET/ROXICET) 5-325 MG tablet Take 1-2 tablets by mouth every 4 (four) hours as needed for moderate pain.   PRESCRIPTION MEDICATION APPLY 0.5GM (2 CLICKS) TO LABIA ONCE A DAY.   promethazine-dextromethorphan (PROMETHAZINE-DM) 6.25-15 MG/5ML syrup Take 5 mLs by mouth 4 (four) times daily as needed for cough.   Testosterone POWD Apply 1 Dose topically daily. Testosterone Cream 2% - APPLY 0.5GM (2 CLICKS) TO LABIA ONCE A DAY.   thyroid (ARMOUR) 60 MG tablet Take 60 mg by mouth daily before breakfast.   vitamin E 400 UNIT capsule Take 400 Units by mouth daily.   No current facility-administered medications for this visit. (Other)   REVIEW OF SYSTEMS: ROS   Positive for: Endocrine, Cardiovascular, Eyes Negative for: Constitutional, Gastrointestinal, Neurological, Skin, Musculoskeletal, HENT, Respiratory,  Psychiatric, Allergic/Imm, Heme/Lymph Last edited by Laddie Aquas, COA on 03/21/2023  1:21 PM.     ALLERGIES Allergies  Allergen Reactions   Penicillins Hives and Itching    CHILDHOOD reaction to Pen G Has patient had a PCN reaction causing immediate rash, facial/tongue/throat swelling, SOB or lightheadedness with hypotension: No Has patient had a PCN reaction causing severe rash involving mucus membranes or skin necrosis: No Has patient had a PCN reaction that required hospitalization: No Has patient had a PCN reaction occurring within the last 10 years: No If all of the above answers are "NO", then may proceed with Cephalosporin  use.      PAST MEDICAL HISTORY Past Medical History:  Diagnosis Date   Arthritis    Cancer (HCC) 07/2021   Right Breast Cancer-Stage 0; 1 month of radation per pt   Complication of anesthesia    "many years ago, had problems waking up"; patient stated no problems with recent surgeries 01/21/2018   GERD (gastroesophageal reflux disease)    Hard of hearing    History of bronchitis    Hypercholesteremia    Hypertriglyceridemia    Hypothyroidism    Macular degeneration of both eyes    OA (osteoarthritis)    severe right knee   Spondylolisthesis of lumbar region    Wears glasses    Past Surgical History:  Procedure Laterality Date   ABDOMINAL HYSTERECTOMY     APPENDECTOMY     BACK SURGERY     lumbar   BREAST LUMPECTOMY Right 07/2021   CATARACT EXTRACTION W/ INTRAOCULAR LENS IMPLANT Bilateral    COLONOSCOPY     ESOPHAGOGASTRODUODENOSCOPY     JOINT REPLACEMENT     bil hip replacements   KNEE ARTHROPLASTY Left    TONSILLECTOMY     TOTAL KNEE ARTHROPLASTY Right 06/12/2016   Procedure: TOTAL KNEE ARTHROPLASTY;  Surgeon: Dannielle Huh, MD;  Location: [facility] OR;  Service: Orthopedics;  Laterality: Right;   TOTAL KNEE REVISION Left 06/14/2015   Procedure: TOTAL KNEE REVISION POLY EXCHANGE;  Surgeon: Dannielle Huh, MD;  Location: [facility] OR;  Service: Orthopedics;  Laterality: Left;   FAMILY HISTORY Family History  Problem Relation Age of Onset   Heart disease Mother    Heart disease Father    SOCIAL HISTORY Social History   Tobacco Use   Smoking status: Former   Smokeless tobacco: Never   Tobacco comments:    "quit 10-15 years ago" 06/03/15  Vaping Use   Vaping status: Never Used  Substance Use Topics   Alcohol use: Yes    Alcohol/week: 2.0 standard drinks of alcohol    Types: 2 Glasses of wine per week    Comment: weekends   Drug use: No       OPHTHALMIC EXAM:  Base Eye Exam     Visual Acuity (Snellen - Linear)       Right Left   Dist [place] 20/30 -2 20/40 -1   Dist ph   NI NI         Tonometry (Tonopen, 1:15 PM)       Right Left   Pressure 13 10         Pupils       Dark Light Shape React APD   Right 3 2 Round Brisk None   Left 3 2 Round Brisk None         Visual Fields (Counting fingers)       Left Right    Full Full  Extraocular Movement       Right Left    Full, Ortho Full, Ortho         Neuro/Psych     Oriented x3: Yes   Mood/Affect: Normal         Dilation     Both eyes: 1.0% Mydriacyl, 2.5% Phenylephrine @ 1:15 PM           Slit Lamp and Fundus Exam     Slit Lamp Exam       Right Left   Lids/Lashes Dermatochalasis - upper lid Dermatochalasis - upper lid   Conjunctiva/Sclera White and quiet White and quiet   Cornea mild arcus, well healed cataract wound mild arcus, well healed cataract wound   Anterior Chamber deep and clear deep and clear   Iris Round and dilated Round and dilated   Lens PC IOL in good position PC IOL in good position   Anterior Vitreous Vitreous syneresis Vitreous syneresis, mild asteroid hyalosis         Fundus Exam       Right Left   Disc Pink and Sharp, +PPP Pink and Sharp, mild PPA   C/D Ratio 0.4 0.2   Macula Flat, Blunted foveal reflex, refractile drusen, +CNV with shallow SRF IT macula -- stably resolved, cystic changes -- stably resolved, no frank heme, pigmented lesion along proximal ST arcades (approx 2DD) Flat, Blunted foveal reflex, refractile drusen, RPE mottling, clumping and early atrophy, No heme or edema   Vessels attenuated, Tortuous attenuated, mild tortuosity   Periphery Attached, No heme Attached, No heme           IMAGING AND PROCEDURES  Imaging and Procedures for 03/21/2023  OCT, Retina - OU - Both Eyes       Right Eye Quality was good. Central Foveal Thickness: 233. Progression has been stable. Findings include no IRF, no SRF, abnormal foveal contour, retinal drusen , subretinal hyper-reflective material, pigment epithelial detachment  (Stable resolution of IRF/cystic changes temporal macula, stable resolution of shallow SRF overlying PEDs IT macula and fovea, Partial PVD).   Left Eye Quality was good. Central Foveal Thickness: 228. Progression has been stable. Findings include normal foveal contour, no IRF, no SRF, retinal drusen , subretinal hyper-reflective material, pigment epithelial detachment, outer retinal atrophy (Scattered drusen with patchy ORA, partial PVD).   Notes *Images captured and stored on drive  Diagnosis / Impression:  OD: exu ARMD -- Stable resolution of IRF/cystic changes temporal macula, stable resolution of shallow SRF overlying PEDs IT macula and fovea, Partial PVD OS: non-exu ARMD -- Scattered drusen with patchy ORA, partial PVD  Clinical management:  See below  Abbreviations: NFP - Normal foveal profile. CME - cystoid macular edema. PED - pigment epithelial detachment. IRF - intraretinal fluid. SRF - subretinal fluid. EZ - ellipsoid zone. ERM - epiretinal membrane. ORA - outer retinal atrophy. ORT - outer retinal tubulation. SRHM - subretinal hyper-reflective material. IRHM - intraretinal hyper-reflective material      Intravitreal Injection, Pharmacologic Agent - OD - Right Eye       Time Out 03/21/2023. 1:45 PM. Confirmed correct patient, procedure, site, and patient consented.   Anesthesia Topical anesthesia was used. Anesthetic medications included Lidocaine 2%, Proparacaine 0.5%.   Procedure Preparation included 5% betadine to ocular surface, eyelid speculum. A (32g) needle was used.   Injection: 2 mg aflibercept 2 MG/0.05ML   Route: Intravitreal, Site: Right Eye   NDC: [id number], Lot: [id number], Expiration date: 06/26/2024, Waste: 0 mL   Post-op  Post injection exam found visual acuity of at least counting fingers. The patient tolerated the procedure well. There were no complications. The patient received written and verbal post procedure care education.            ASSESSMENT/PLAN:   ICD-10-CM   1. Exudative age-related macular degeneration of right eye with active choroidal neovascularization (HCC)  H35.3211 OCT, Retina - OU - Both Eyes    Intravitreal Injection, Pharmacologic Agent - OD - Right Eye    aflibercept (EYLEA) SOLN 2 mg    2. Intermediate stage nonexudative age-related macular degeneration of left eye  H35.3122     3. Asteroid hyalosis of left eye  H43.22     4. Nevus of choroid of right eye  D31.31     5. Pseudophakia, both eyes  Z96.1      Exudative age related macular degeneration, OD  - s/p IVA OD #1 (05.08.24), #2 (06.05.24), #3 (07.03.24), #4 (07.31.24), #5 (08.28.24) -- IVA resistance  - s/p IVE OD #1 (09.26.24), #2 (10.30.24), #3 (12.04.24)  - BCVA 20/30 OD -- stable  - OCT OD shows OD: Stable resolution of IRF/cystic changes temporal macula, stable resolution of shallow SRF overlying PEDs IT macula and fovea, Partial PVD at 7 wks  - recommend IVE OD #4 today, 01.22.25 w/ f/u extended to 9 wks - pt wishes to proceed with injection - RBA of procedure discussed, questions answered - IVE informed consent obtained and signed, 09.26.24 - see procedure note  - Eylea authorization approved for 2025 -- but no funding for Good Days  - f/u in 9 wks -- DFE/OCT, possible injection  2. Age related macular degeneration, non-exudative, left eye  - The incidence, anatomy, and pathology of dry AMD, risk of progression, and the AREDS and AREDS 2 study including smoking risks discussed with patient.  - Recommend amsler grid monitoring - OCT OS: non-exu ARMD -- Scattered drusen with patchy ORA, partial PVD  - f/u 3 months, DFE, OCT  3. Asteroid hyalosis OS  4. Choroidal Nevus, OD  - pigmented lesion along proximal arcades  - no visual symptoms, SRF or orange pigment  - +drusen  - thickness < 2mm  - discussed findings, prognosis  - monitoring  5. Pseudophakia OU  - s/p CE/IOL OU (CEA, [place], [place])  - IOL in good position, doing  well  - monitor  Ophthalmic Meds Ordered this visit:  Meds ordered this encounter  Medications   aflibercept (EYLEA) SOLN 2 mg     Return in about 9 weeks (around 05/23/2023) for f/u exu ARMD OD, DFE, OCT.  There are no Patient Instructions on file for this visit.  Explained the diagnoses, plan, and follow up with the patient and they expressed understanding.  Patient expressed understanding of the importance of proper follow up care.   This document serves as a record of services personally performed by Karie Chimera, MD, PhD. It was created on their behalf by Glee Arvin. Manson Passey, OA an ophthalmic technician. The creation of this record is the provider's dictation and/or activities during the visit.    Electronically signed by: Glee Arvin. Manson Passey, OA 03/24/23 1:05 AM  Karie Chimera, M.D., Ph.D. Diseases & Surgery of the Retina and Vitreous [facility] 03/21/2023  I have reviewed the above documentation for accuracy and completeness, and I agree with the above. Karie Chimera, M.D., Ph.D. 03/24/23 1:06 AM  Abbreviations: M myopia (nearsighted); A astigmatism; H hyperopia (farsighted); P presbyopia;  Mrx spectacle prescription;  CTL contact lenses; OD right eye; OS left eye; OU both eyes  XT exotropia; ET esotropia; PEK punctate epithelial keratitis; PEE punctate epithelial erosions; DES dry eye syndrome; MGD meibomian gland dysfunction; ATs artificial tears; PFAT's preservative free artificial tears; NSC nuclear sclerotic cataract; PSC posterior subcapsular cataract; ERM epi-retinal membrane; PVD posterior vitreous detachment; RD retinal detachment; DM diabetes mellitus; DR diabetic retinopathy; NPDR non-proliferative diabetic retinopathy; PDR proliferative diabetic retinopathy; CSME clinically significant macular edema; DME diabetic macular edema; dbh dot blot hemorrhages; CWS cotton wool spot; POAG primary open angle glaucoma; C/D cup-to-disc ratio; HVF humphrey visual  field; GVF goldmann visual field; OCT optical coherence tomography; IOP intraocular pressure; BRVO Branch retinal vein occlusion; CRVO central retinal vein occlusion; CRAO central retinal artery occlusion; BRAO branch retinal artery occlusion; RT retinal tear; SB scleral buckle; PPV pars plana vitrectomy; VH Vitreous hemorrhage; PRP panretinal laser photocoagulation; IVK intravitreal kenalog; VMT vitreomacular traction; MH Macular hole;  NVD neovascularization of the disc; NVE neovascularization elsewhere; AREDS age related eye disease study; ARMD age related macular degeneration; POAG primary open angle glaucoma; EBMD epithelial/anterior basement membrane dystrophy; ACIOL anterior chamber intraocular lens; IOL intraocular lens; PCIOL posterior chamber intraocular lens; Phaco/IOL phacoemulsification with intraocular lens placement; PRK photorefractive keratectomy; LASIK laser assisted in situ keratomileusis; HTN hypertension; DM diabetes mellitus; COPD chronic obstructive pulmonary disease

## 2023-03-21 ENCOUNTER — Encounter (INDEPENDENT_AMBULATORY_CARE_PROVIDER_SITE_OTHER): Payer: Self-pay | Admitting: Ophthalmology

## 2023-03-21 ENCOUNTER — Ambulatory Visit (INDEPENDENT_AMBULATORY_CARE_PROVIDER_SITE_OTHER): Payer: PPO | Admitting: Ophthalmology

## 2023-03-21 DIAGNOSIS — H4322 Crystalline deposits in vitreous body, left eye: Secondary | ICD-10-CM

## 2023-03-21 DIAGNOSIS — H353211 Exudative age-related macular degeneration, right eye, with active choroidal neovascularization: Secondary | ICD-10-CM | POA: Diagnosis not present

## 2023-03-21 DIAGNOSIS — Z961 Presence of intraocular lens: Secondary | ICD-10-CM

## 2023-03-21 DIAGNOSIS — D3131 Benign neoplasm of right choroid: Secondary | ICD-10-CM | POA: Diagnosis not present

## 2023-03-21 DIAGNOSIS — H353122 Nonexudative age-related macular degeneration, left eye, intermediate dry stage: Secondary | ICD-10-CM | POA: Diagnosis not present

## 2023-03-21 MED ORDER — AFLIBERCEPT 2MG/0.05ML IZ SOLN FOR KALEIDOSCOPE
2.0000 mg | INTRAVITREAL | Status: AC | PRN
  Administered 2023-03-21: 2 mg via INTRAVITREAL

## 2023-05-18 NOTE — Progress Notes (Signed)
 [facility] - Clinic Note  05/23/2023   CHIEF COMPLAINT Patient presents for Retina Follow Up  HISTORY OF PRESENT ILLNESS: Molly Ferrell is a 73 y.o. female who presents to the clinic today for:  HPI     Retina Follow Up   Patient presents with  Wet AMD.  In right eye.  This started 9 weeks ago.  I, the attending physician,  performed the HPI with the patient and updated documentation appropriately.        Comments   Patient feels the vision in the left eye is just not clear. She is using AT's.       Last edited by Rennis Chris, MD on 05/23/2023  5:55 PM.     Pt feels like her vision is not very good, she has been sick with a sinus infection and bronchitis   Referring physician: Diona Foley, MD [address] [place],  [place] [address]  HISTORICAL INFORMATION:  Selected notes from the MEDICAL RECORD NUMBER Referred by Dr. Zenaida Niece for concern of exu ARMD OD LEE:  Ocular Hx- PMH-   CURRENT MEDICATIONS: No current outpatient medications on file. (Ophthalmic Drugs)   No current facility-administered medications for this visit. (Ophthalmic Drugs)   Current Outpatient Medications (Other)  Medication Sig   albuterol (VENTOLIN HFA) 108 (90 Base) MCG/ACT inhaler Inhale 2 puffs into the lungs every 4 (four) hours as needed for wheezing or shortness of breath.   Ascorbic Acid (VITAMIN C) 1000 MG tablet Take 1,000 mg by mouth daily.   aspirin EC 81 MG tablet Take 81 mg by mouth 2 (two) times daily. Swallow whole.   atorvastatin (LIPITOR) 10 MG tablet Take 10 mg by mouth daily.   cholecalciferol (VITAMIN D3) 25 MCG (1000 UNIT) tablet Take 1,000 Units by mouth in the morning and at bedtime.   dicyclomine (BENTYL) 10 MG capsule Take 10 mg by mouth every 6 (six) hours as needed for spasms.   docusate sodium (COLACE) 100 MG capsule Take 1 capsule (100 mg total) by mouth 2 (two) times daily.   doxycycline (VIBRAMYCIN) 100 MG capsule Take 1 capsule (100 mg  total) by mouth 2 (two) times daily for 7 days.   fish oil-omega-3 fatty acids 1000 MG capsule Take 1 g by mouth daily.   Lutein 20 MG TABS Take 20 mg by mouth 2 (two) times daily.   MAGNESIUM GLYCINATE PO Take 350 mg by mouth at bedtime.   Melatonin 5 MG TABS Take 5 mg by mouth at bedtime.   niacin 500 MG tablet Take 500 mg by mouth 2 (two) times daily.    omeprazole (PRILOSEC) 40 MG capsule Take 40 mg by mouth daily.   predniSONE (DELTASONE) 20 MG tablet Take 1 tablet (20 mg total) by mouth daily with breakfast for 5 days.   promethazine-dextromethorphan (PROMETHAZINE-DM) 6.25-15 MG/5ML syrup Take 5 mLs by mouth 4 (four) times daily as needed for cough.   Spacer/Aero-Holding Chambers (COMPACT SPACE CHAMBER) DEVI Use with the albuterol inhaler   Testosterone POWD Apply 1 Dose topically daily. Testosterone Cream 2% - APPLY 0.5GM (2 CLICKS) TO LABIA ONCE A DAY.   thyroid (ARMOUR) 60 MG tablet Take 60 mg by mouth daily before breakfast.   vitamin E 400 UNIT capsule Take 400 Units by mouth daily.   No current facility-administered medications for this visit. (Other)   REVIEW OF SYSTEMS: ROS   Positive for: Endocrine, Cardiovascular, Eyes Negative for: Constitutional, Gastrointestinal, Neurological, Skin, Musculoskeletal, HENT,  Respiratory, Psychiatric, Allergic/Imm, Heme/Lymph Last edited by Charlette Caffey, COT on 05/23/2023  1:21 PM.      ALLERGIES Allergies  Allergen Reactions   Penicillins Hives and Itching    CHILDHOOD reaction to Pen G Has patient had a PCN reaction causing immediate rash, facial/tongue/throat swelling, SOB or lightheadedness with hypotension: No Has patient had a PCN reaction causing severe rash involving mucus membranes or skin necrosis: No Has patient had a PCN reaction that required hospitalization: No Has patient had a PCN reaction occurring within the last 10 years: No If all of the above answers are "NO", then may proceed with Cephalosporin use.       PAST MEDICAL HISTORY Past Medical History:  Diagnosis Date   Arthritis    Cancer (HCC) 07/2021   Right Breast Cancer-Stage 0; 1 month of radation per pt   Complication of anesthesia    "many years ago, had problems waking up"; patient stated no problems with recent surgeries 01/21/2018   GERD (gastroesophageal reflux disease)    Hard of hearing    History of bronchitis    Hypercholesteremia    Hypertriglyceridemia    Hypothyroidism    Macular degeneration of both eyes    OA (osteoarthritis)    severe right knee   Spondylolisthesis of lumbar region    Wears glasses    Past Surgical History:  Procedure Laterality Date   ABDOMINAL HYSTERECTOMY     APPENDECTOMY     BACK SURGERY     lumbar   BREAST LUMPECTOMY Right 07/2021   CATARACT EXTRACTION W/ INTRAOCULAR LENS IMPLANT Bilateral    COLONOSCOPY     ESOPHAGOGASTRODUODENOSCOPY     JOINT REPLACEMENT     bil hip replacements   KNEE ARTHROPLASTY Left    TONSILLECTOMY     TOTAL KNEE ARTHROPLASTY Right 06/12/2016   Procedure: TOTAL KNEE ARTHROPLASTY;  Surgeon: Dannielle Huh, MD;  Location: [facility] OR;  Service: Orthopedics;  Laterality: Right;   TOTAL KNEE REVISION Left 06/14/2015   Procedure: TOTAL KNEE REVISION POLY EXCHANGE;  Surgeon: Dannielle Huh, MD;  Location: [facility] OR;  Service: Orthopedics;  Laterality: Left;   FAMILY HISTORY Family History  Problem Relation Age of Onset   Heart disease Mother    Heart disease Father    SOCIAL HISTORY Social History   Tobacco Use   Smoking status: Former   Smokeless tobacco: Never   Tobacco comments:    "quit 10-15 years ago" 06/03/15  Vaping Use   Vaping status: Never Used  Substance Use Topics   Alcohol use: Yes    Alcohol/week: 2.0 standard drinks of alcohol    Types: 2 Glasses of wine per week    Comment: weekends   Drug use: No       OPHTHALMIC EXAM:  Base Eye Exam     Visual Acuity (Snellen - Linear)       Right Left   Dist [place] 20/40 20/80 +1   Dist ph [place] NI NI          Tonometry (Tonopen, 1:24 PM)       Right Left   Pressure 20 16         Pupils       Dark Light Shape React APD   Right 3 2 Round Brisk None   Left 3 2 Round Brisk None         Visual Fields       Left Right    Full Full  Extraocular Movement       Right Left    Full, Ortho Full, Ortho         Neuro/Psych     Oriented x3: Yes   Mood/Affect: Normal         Dilation     Both eyes: 1.0% Mydriacyl, 2.5% Phenylephrine @ 1:22 PM           Slit Lamp and Fundus Exam     Slit Lamp Exam       Right Left   Lids/Lashes Dermatochalasis - upper lid Dermatochalasis - upper lid   Conjunctiva/Sclera White and quiet White and quiet   Cornea mild arcus, well healed cataract wound mild arcus, well healed cataract wound   Anterior Chamber deep and clear deep and clear   Iris Round and dilated Round and dilated   Lens PC IOL in good position PC IOL in good position   Anterior Vitreous Vitreous syneresis Vitreous syneresis, mild asteroid hyalosis         Fundus Exam       Right Left   Disc Pink and Sharp, +PPP Pink and Sharp, mild PPA   C/D Ratio 0.4 0.2   Macula Flat, Blunted foveal reflex, refractile drusen, +CNV with shallow SRF IT macula -- stably resolved, cystic changes -- stably resolved, no frank heme, pigmented lesion along proximal ST arcades (approx 2DD) Flat, Blunted foveal reflex, refractile drusen, RPE mottling, clumping and early atrophy, No heme or edema   Vessels attenuated, Tortuous attenuated, Tortuous   Periphery Attached, No heme Attached, No heme           IMAGING AND PROCEDURES  Imaging and Procedures for 05/23/2023  OCT, Retina - OU - Both Eyes       Right Eye Quality was good. Central Foveal Thickness: 211. Progression has been stable. Findings include normal foveal contour, no IRF, no SRF, retinal drusen , subretinal hyper-reflective material, pigment epithelial detachment (Stable resolution of IRF/cystic changes  temporal macula, stable resolution of shallow SRF overlying PEDs IT macula and fovea, Partial PVD).   Left Eye Quality was good. Central Foveal Thickness: 228. Progression has been stable. Findings include normal foveal contour, no IRF, no SRF, retinal drusen , subretinal hyper-reflective material, pigment epithelial detachment, outer retinal atrophy (Scattered drusen with patchy ORA, partial PVD).   Notes *Images captured and stored on drive  Diagnosis / Impression:  OD: exu ARMD -- Stable resolution of IRF/cystic changes temporal macula, stable resolution of shallow SRF overlying PEDs IT macula and fovea, Partial PVD OS: non-exu ARMD -- Scattered drusen with patchy ORA, partial PVD  Clinical management:  See below  Abbreviations: NFP - Normal foveal profile. CME - cystoid macular edema. PED - pigment epithelial detachment. IRF - intraretinal fluid. SRF - subretinal fluid. EZ - ellipsoid zone. ERM - epiretinal membrane. ORA - outer retinal atrophy. ORT - outer retinal tubulation. SRHM - subretinal hyper-reflective material. IRHM - intraretinal hyper-reflective material      Intravitreal Injection, Pharmacologic Agent - OD - Right Eye       Time Out 05/23/2023. 2:07 PM. Confirmed correct patient, procedure, site, and patient consented.   Anesthesia Topical anesthesia was used. Anesthetic medications included Lidocaine 2%, Proparacaine 0.5%.   Procedure Preparation included 5% betadine to ocular surface, eyelid speculum. A (32g) needle was used.   Injection: 2 mg aflibercept 2 MG/0.05ML   Route: Intravitreal, Site: Right Eye   NDC: [id number], Lot: [id number], Expiration date: 07/26/2024, Waste: 0 mL   Post-op Post  injection exam found visual acuity of at least counting fingers. The patient tolerated the procedure well. There were no complications. The patient received written and verbal post procedure care education.            ASSESSMENT/PLAN:   ICD-10-CM   1.  Exudative age-related macular degeneration of right eye with active choroidal neovascularization (HCC)  H35.3211 OCT, Retina - OU - Both Eyes    Intravitreal Injection, Pharmacologic Agent - OD - Right Eye    aflibercept (EYLEA) SOLN 2 mg    2. Intermediate stage nonexudative age-related macular degeneration of left eye  H35.3122     3. Asteroid hyalosis of left eye  H43.22     4. Nevus of choroid of right eye  D31.31     5. Pseudophakia, both eyes  Z96.1      Exudative age related macular degeneration, OD  - s/p IVA OD #1 (05.08.24), #2 (06.05.24), #3 (07.03.24), #4 (07.31.24), #5 (08.28.24) -- IVA resistance  - s/p IVE OD #1 (09.26.24), #2 (10.30.24), #3 (12.04.24), #4 (01.22.25)  - BCVA 20/30 OD -- stable  - OCT OD shows OD: Stable resolution of IRF/cystic changes temporal macula, stable resolution of shallow SRF overlying PEDs IT macula and fovea, Partial PVD at 9 wks  - recommend IVE OD #5 today, 03.26.25 w/ f/u extended to 11 wks - pt wishes to proceed with injection - RBA of procedure discussed, questions answered - IVE informed consent obtained and signed, 09.26.24 - see procedure note  - Eylea authorization approved for 2025 -- but no funding for [employer] -- pt is covering 20%  - f/u in 11 wks -- DFE/OCT, possible injection  2. Age related macular degeneration, non-exudative, left eye  - The incidence, anatomy, and pathology of dry AMD, risk of progression, and the AREDS and AREDS 2 study including smoking risks discussed with patient.  - Recommend amsler grid monitoring - OCT OS: non-exu ARMD -- Scattered drusen with patchy ORA, partial PVD  - f/u 3 months, DFE, OCT  3. Asteroid hyalosis OS  4. Choroidal Nevus, OD  - pigmented lesion along proximal arcades  - no visual symptoms, SRF or orange pigment  - +drusen  - thickness < 2mm  - discussed findings, prognosis  - monitoring  5. Pseudophakia OU  - s/p CE/IOL OU (CEA, [place], [place])  - IOL in good position,  doing well  - monitor  Ophthalmic Meds Ordered this visit:  Meds ordered this encounter  Medications   aflibercept (EYLEA) SOLN 2 mg     Return in about 11 weeks (around 08/08/2023) for f/u exu ARMD OD, DFE, OCT, Possible Injxn.  There are no Patient Instructions on file for this visit.  Explained the diagnoses, plan, and follow up with the patient and they expressed understanding.  Patient expressed understanding of the importance of proper follow up care.   This document serves as a record of services personally performed by Karie Chimera, MD, PhD. It was created on their behalf by Glee Arvin. Manson Passey, OA an ophthalmic technician. The creation of this record is the provider's dictation and/or activities during the visit.    Electronically signed by: Glee Arvin. Manson Passey, OA 05/27/23 2:20 AM  Karie Chimera, M.D., Ph.D. Diseases & Surgery of the Retina and Vitreous [facility] 05/23/2023  I have reviewed the above documentation for accuracy and completeness, and I agree with the above. Karie Chimera, M.D., Ph.D. 05/27/23 2:21 AM   Abbreviations:  M myopia (nearsighted); A astigmatism; H hyperopia (farsighted); P presbyopia; Mrx spectacle prescription;  CTL contact lenses; OD right eye; OS left eye; OU both eyes  XT exotropia; ET esotropia; PEK punctate epithelial keratitis; PEE punctate epithelial erosions; DES dry eye syndrome; MGD meibomian gland dysfunction; ATs artificial tears; PFAT's preservative free artificial tears; NSC nuclear sclerotic cataract; PSC posterior subcapsular cataract; ERM epi-retinal membrane; PVD posterior vitreous detachment; RD retinal detachment; DM diabetes mellitus; DR diabetic retinopathy; NPDR non-proliferative diabetic retinopathy; PDR proliferative diabetic retinopathy; CSME clinically significant macular edema; DME diabetic macular edema; dbh dot blot hemorrhages; CWS cotton wool spot; POAG primary open angle glaucoma; C/D cup-to-disc  ratio; HVF humphrey visual field; GVF goldmann visual field; OCT optical coherence tomography; IOP intraocular pressure; BRVO Branch retinal vein occlusion; CRVO central retinal vein occlusion; CRAO central retinal artery occlusion; BRAO branch retinal artery occlusion; RT retinal tear; SB scleral buckle; PPV pars plana vitrectomy; VH Vitreous hemorrhage; PRP panretinal laser photocoagulation; IVK intravitreal kenalog; VMT vitreomacular traction; MH Macular hole;  NVD neovascularization of the disc; NVE neovascularization elsewhere; AREDS age related eye disease study; ARMD age related macular degeneration; POAG primary open angle glaucoma; EBMD epithelial/anterior basement membrane dystrophy; ACIOL anterior chamber intraocular lens; IOL intraocular lens; PCIOL posterior chamber intraocular lens; Phaco/IOL phacoemulsification with intraocular lens placement; PRK photorefractive keratectomy; LASIK laser assisted in situ keratomileusis; HTN hypertension; DM diabetes mellitus; COPD chronic obstructive pulmonary disease

## 2023-05-23 ENCOUNTER — Encounter (HOSPITAL_BASED_OUTPATIENT_CLINIC_OR_DEPARTMENT_OTHER): Payer: Self-pay | Admitting: Emergency Medicine

## 2023-05-23 ENCOUNTER — Encounter (INDEPENDENT_AMBULATORY_CARE_PROVIDER_SITE_OTHER): Payer: Self-pay | Admitting: Ophthalmology

## 2023-05-23 ENCOUNTER — Ambulatory Visit (INDEPENDENT_AMBULATORY_CARE_PROVIDER_SITE_OTHER): Payer: PPO | Admitting: Ophthalmology

## 2023-05-23 ENCOUNTER — Ambulatory Visit (HOSPITAL_BASED_OUTPATIENT_CLINIC_OR_DEPARTMENT_OTHER): Admission: EM | Admit: 2023-05-23 | Discharge: 2023-05-23 | Disposition: A | Discharge status: Home / Self Care

## 2023-05-23 DIAGNOSIS — J208 Acute bronchitis due to other specified organisms: Secondary | ICD-10-CM | POA: Diagnosis not present

## 2023-05-23 DIAGNOSIS — Z961 Presence of intraocular lens: Secondary | ICD-10-CM

## 2023-05-23 DIAGNOSIS — H353122 Nonexudative age-related macular degeneration, left eye, intermediate dry stage: Secondary | ICD-10-CM | POA: Diagnosis not present

## 2023-05-23 DIAGNOSIS — J0140 Acute pansinusitis, unspecified: Secondary | ICD-10-CM | POA: Diagnosis not present

## 2023-05-23 DIAGNOSIS — H4322 Crystalline deposits in vitreous body, left eye: Secondary | ICD-10-CM

## 2023-05-23 DIAGNOSIS — H353211 Exudative age-related macular degeneration, right eye, with active choroidal neovascularization: Secondary | ICD-10-CM | POA: Diagnosis not present

## 2023-05-23 DIAGNOSIS — D3131 Benign neoplasm of right choroid: Secondary | ICD-10-CM

## 2023-05-23 MED ORDER — DOXYCYCLINE HYCLATE 100 MG PO CAPS: 100.0000 mg | ORAL_CAPSULE | Freq: Two times a day (BID) | ORAL | 0 refills | Status: AC

## 2023-05-23 MED ORDER — AFLIBERCEPT 2MG/0.05ML IZ SOLN FOR KALEIDOSCOPE
2.0000 mg | INTRAVITREAL | Status: AC | PRN
  Administered 2023-05-23: 2 mg via INTRAVITREAL

## 2023-05-23 MED ORDER — ALBUTEROL SULFATE HFA 108 (90 BASE) MCG/ACT IN AERS: 2.0000 | INHALATION_SPRAY | RESPIRATORY_TRACT | 0 refills | Status: AC | PRN

## 2023-05-23 MED ORDER — COMPACT SPACE CHAMBER DEVI: 0 refills | Status: AC

## 2023-05-23 MED ORDER — PREDNISONE 20 MG PO TABS: 20.0000 mg | ORAL_TABLET | Freq: Every day | ORAL | 0 refills | Status: AC

## 2023-05-23 MED ORDER — PROMETHAZINE-DM 6.25-15 MG/5ML PO SYRP: 5.0000 mL | ORAL_SOLUTION | Freq: Four times a day (QID) | ORAL | 0 refills | Status: AC | PRN

## 2023-05-23 NOTE — ED Provider Notes (Signed)
 Molly Ferrell CARE    CSN: [id number] Arrival date & time: 05/23/23  8657      History   Chief Complaint Chief Complaint  Patient presents with   Cough    HPI Molly Ferrell is a 73 y.o. female.   Patient reports that she gets bronchitis about once a year.  Usually it will worsen and she ends up with needing antibiotics at some point right now she has facial pressure and pain with coughing and sneezing.  She has not gotten much mucus out.  She is coughing so much and wheezing so that she is having soreness in her ribs.  Symptoms started on Thursday (05/17/23) or Friday (05/18/23).   Cough Associated symptoms: wheezing   Associated symptoms: no chest pain, no chills, no ear pain, no fever, no rash, no shortness of breath and no sore throat     Past Medical History:  Diagnosis Date   Arthritis    Cancer (HCC) 07/2021   Right Breast Cancer-Stage 0; 1 month of radation per pt   Complication of anesthesia    "many years ago, had problems waking up"; patient stated no problems with recent surgeries 01/21/2018   GERD (gastroesophageal reflux disease)    Hard of hearing    History of bronchitis    Hypercholesteremia    Hypertriglyceridemia    Hypothyroidism    Macular degeneration of both eyes    OA (osteoarthritis)    severe right knee   Spondylolisthesis of lumbar region    Wears glasses     Patient Active Problem List   Diagnosis Date Noted   Lumbar adjacent segment disease with spondylolisthesis 01/25/2022   Ductal carcinoma in situ (DCIS) of right breast 08/09/2021    Class: Diagnosis of   Spinal stenosis of lumbar region with neurogenic claudication 03/03/2019   Spondylolisthesis of lumbar region 01/30/2018   S/P total knee replacement 06/12/2016   S/P revision of total knee 06/14/2015    Past Surgical History:  Procedure Laterality Date   ABDOMINAL HYSTERECTOMY     APPENDECTOMY     BACK SURGERY     lumbar   BREAST LUMPECTOMY Right 07/2021    CATARACT EXTRACTION W/ INTRAOCULAR LENS IMPLANT Bilateral    COLONOSCOPY     ESOPHAGOGASTRODUODENOSCOPY     JOINT REPLACEMENT     bil hip replacements   KNEE ARTHROPLASTY Left    TONSILLECTOMY     TOTAL KNEE ARTHROPLASTY Right 06/12/2016   Procedure: TOTAL KNEE ARTHROPLASTY;  Surgeon: Dannielle Huh, MD;  Location: [facility] OR;  Service: Orthopedics;  Laterality: Right;   TOTAL KNEE REVISION Left 06/14/2015   Procedure: TOTAL KNEE REVISION POLY EXCHANGE;  Surgeon: Dannielle Huh, MD;  Location: [facility] OR;  Service: Orthopedics;  Laterality: Left;    OB History   No obstetric history on file.      Home Medications    Prior to Admission medications   Medication Sig Start Date End Date Taking? Authorizing Provider  albuterol (VENTOLIN HFA) 108 (90 Base) MCG/ACT inhaler Inhale 2 puffs into the lungs every 4 (four) hours as needed for wheezing or shortness of breath. 05/23/23  Yes Prescilla Sours, FNP  atorvastatin (LIPITOR) 10 MG tablet Take 10 mg by mouth daily. 01/20/19  Yes [provider]  doxycycline (VIBRAMYCIN) 100 MG capsule Take 1 capsule (100 mg total) by mouth 2 (two) times daily for 7 days. 05/23/23 05/30/23 Yes Prescilla Sours, FNP  omeprazole (PRILOSEC) 40 MG capsule Take 40 mg by mouth daily. 02/16/21  Yes [provider]  predniSONE (DELTASONE) 20 MG tablet Take 1 tablet (20 mg total) by mouth daily with breakfast for 5 days. 05/23/23 05/28/23 Yes Prescilla Sours, FNP  Spacer/Aero-Holding Chambers (COMPACT SPACE CHAMBER) DEVI Use with the albuterol inhaler 05/23/23  Yes Prescilla Sours, FNP  thyroid (ARMOUR) 60 MG tablet Take 60 mg by mouth daily before breakfast.   Yes [provider]  Ascorbic Acid (VITAMIN C) 1000 MG tablet Take 1,000 mg by mouth daily.    [provider]  aspirin EC 81 MG tablet Take 81 mg by mouth 2 (two) times daily. Swallow whole.    [provider]  cholecalciferol (VITAMIN D3) 25 MCG (1000 UNIT) tablet Take 1,000 Units by mouth in  the morning and at bedtime.    [provider]  dicyclomine (BENTYL) 10 MG capsule Take 10 mg by mouth every 6 (six) hours as needed for spasms. 07/31/19   [provider]  docusate sodium (COLACE) 100 MG capsule Take 1 capsule (100 mg total) by mouth 2 (two) times daily. 01/26/22   Tressie Stalker, MD  fish oil-omega-3 fatty acids 1000 MG capsule Take 1 g by mouth daily.    [provider]  Lutein 20 MG TABS Take 20 mg by mouth 2 (two) times daily.    [provider]  MAGNESIUM GLYCINATE PO Take 350 mg by mouth at bedtime.    [provider]  Melatonin 5 MG TABS Take 5 mg by mouth at bedtime.    [provider]  niacin 500 MG tablet Take 500 mg by mouth 2 (two) times daily.     [provider]  promethazine-dextromethorphan (PROMETHAZINE-DM) 6.25-15 MG/5ML syrup Take 5 mLs by mouth 4 (four) times daily as needed for cough. 05/23/23   Prescilla Sours, FNP  Testosterone POWD Apply 1 Dose topically daily. Testosterone Cream 2% - APPLY 0.5GM (2 CLICKS) TO LABIA ONCE A DAY.    [provider]  vitamin E 400 UNIT capsule Take 400 Units by mouth daily.    [provider]    Family History Family History  Problem Relation Age of Onset   Heart disease Mother    Heart disease Father     Social History Social History   Tobacco Use   Smoking status: Former   Smokeless tobacco: Never   Tobacco comments:    "quit 10-15 years ago" 06/03/15  Vaping Use   Vaping status: Never Used  Substance Use Topics   Alcohol use: Yes    Alcohol/week: 2.0 standard drinks of alcohol    Types: 2 Glasses of wine per week    Comment: weekends   Drug use: No     Allergies   Penicillins   Review of Systems Review of Systems  Constitutional:  Negative for chills and fever.  HENT:  Positive for sinus pressure. Negative for ear pain and sore throat.   Eyes:  Negative for pain and visual disturbance.  Respiratory:  Positive for cough  and wheezing. Negative for shortness of breath.   Cardiovascular:  Negative for chest pain and palpitations.  Gastrointestinal:  Negative for abdominal pain, constipation, diarrhea, nausea and vomiting.  Genitourinary:  Negative for dysuria and hematuria.  Musculoskeletal:  Negative for arthralgias and back pain.  Skin:  Negative for color change and rash.  Neurological:  Negative for seizures and syncope.  All other systems reviewed and are negative.    Physical Exam Triage Vital Signs ED Triage Vitals  Encounter Vitals Group  BP 05/23/23 0945 (!) 143/76     Systolic BP Percentile --      Diastolic BP Percentile --      Pulse Rate 05/23/23 0945 66     Resp 05/23/23 0945 20     Temp 05/23/23 0945 98.2 F (36.8 C)     Temp Source 05/23/23 0945 Oral     SpO2 05/23/23 0945 95 %     Weight --      Height --      Head Circumference --      Peak Flow --      Pain Score 05/23/23 0943 0     Pain Loc --      Pain Education --      Exclude from Growth Chart --    No data found.  Updated Vital Signs BP (!) 143/76 (BP Location: Right Arm)   Pulse 66   Temp 98.2 F (36.8 C) (Oral)   Resp 20   SpO2 95%   Visual Acuity Right Eye Distance:   Left Eye Distance:   Bilateral Distance:    Right Eye Near:   Left Eye Near:    Bilateral Near:     Physical Exam Vitals and nursing note reviewed.  Constitutional:      General: She is not in acute distress.    Appearance: She is well-developed. She is not ill-appearing or toxic-appearing.  HENT:     Head: Normocephalic and atraumatic.     Right Ear: Hearing, tympanic membrane, ear canal and external ear normal.     Left Ear: Hearing, tympanic membrane, ear canal and external ear normal.     Nose: Mucosal edema, congestion and rhinorrhea present. Rhinorrhea is clear.     Right Sinus: Maxillary sinus tenderness and frontal sinus tenderness present.     Left Sinus: Maxillary sinus tenderness and frontal sinus tenderness present.      Comments: Patient reports that her sinus tenderness is more pressure than pain.    Mouth/Throat:     Lips: Pink.     Mouth: Mucous membranes are moist.     Pharynx: Uvula midline. No oropharyngeal exudate or posterior oropharyngeal erythema.     Tonsils: No tonsillar exudate.  Eyes:     Conjunctiva/sclera: Conjunctivae normal.     Pupils: Pupils are equal, round, and reactive to light.  Cardiovascular:     Rate and Rhythm: Normal rate and regular rhythm.     Heart sounds: S1 normal and S2 normal. No murmur heard. Pulmonary:     Effort: Pulmonary effort is normal. No respiratory distress.     Breath sounds: Examination of the right-upper field reveals wheezing. Examination of the left-upper field reveals wheezing. Wheezing (Mild and intermittent.) present. No decreased breath sounds, rhonchi or rales.  Abdominal:     General: Bowel sounds are normal.     Palpations: Abdomen is soft.     Tenderness: There is no abdominal tenderness.  Musculoskeletal:        General: No swelling.     Cervical back: Neck supple.  Lymphadenopathy:     Head:     Right side of head: No submental, submandibular, tonsillar, preauricular or posterior auricular adenopathy.     Left side of head: No submental, submandibular, tonsillar, preauricular or posterior auricular adenopathy.     Cervical: No cervical adenopathy.     Right cervical: No superficial cervical adenopathy.    Left cervical: No superficial cervical adenopathy.  Skin:    General: Skin is  warm and dry.     Capillary Refill: Capillary refill takes less than 2 seconds.     Findings: No rash.  Neurological:     Mental Status: She is alert and oriented to person, place, and time.  Psychiatric:        Mood and Affect: Mood normal.      UC Treatments / Results  Labs (all labs ordered are listed, but only abnormal results are displayed) Labs Reviewed - No data to display  EKG   Radiology No results found.  Procedures Procedures  (including critical care time)  Medications Ordered in UC Medications - No data to display  Initial Impression / Assessment and Plan / UC Course  I have reviewed the triage vital signs and the nursing notes.  Pertinent labs & imaging results that were available during my care of the patient were reviewed by me and considered in my medical decision making (see chart for details).     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  She is concerned about using steroids due to that.  I am treating her for sinusitis and bronchitis.  Provided prednisone, 20 mg 1 daily for 5 days as a printed prescription.  She will take it to her eye doctor and discussed use of the prednisone with him.  Doxycycline 100 mg twice daily for 7 days for sinusitis.  Albuterol inhaler with spacer, 2 puffs every 4 hours if needed for wheezing.  Promethazine DM, 5 mL, every 6 hours if needed for cough.  Get plenty of fluids and rest.  Follow-up here or with PCP if symptoms do not improve, worsen or new symptoms occur. Final Clinical Impressions(s) / UC Diagnoses   Final diagnoses:  Acute non-recurrent pansinusitis  Acute viral bronchitis     Discharge Instructions      Exam shows sinusitis and early bronchitis.  Refilled her Promethazine DM, 5 mL, every 6 hours as needed for cough.  Doxycycline, 100 mg twice daily for 7 days for sinusitis.  Albuterol inhaler with spacer, 2 puffs every 4 hours if needed for wheezing.  Prednisone, 20 mg 1 daily for 5 days.  Prednisone prescription was printed and she will discuss it with her eye doctor before she starts it.  Get plenty of fluids and rest.  Follow-up here or with PCP, if symptoms do not improve, worsen or new symptoms occur.     ED Prescriptions     Medication Sig Dispense Auth. Provider   promethazine-dextromethorphan (PROMETHAZINE-DM) 6.25-15 MG/5ML syrup Take 5 mLs by mouth 4 (four) times daily as needed for cough. 100 mL Prescilla Sours, FNP   doxycycline  (VIBRAMYCIN) 100 MG capsule Take 1 capsule (100 mg total) by mouth 2 (two) times daily for 7 days. 14 capsule Prescilla Sours, FNP   albuterol (VENTOLIN HFA) 108 (90 Base) MCG/ACT inhaler Inhale 2 puffs into the lungs every 4 (four) hours as needed for wheezing or shortness of breath. 1 each Prescilla Sours, FNP   Spacer/Aero-Holding Chambers (COMPACT SPACE CHAMBER) DEVI Use with the albuterol inhaler 1 each Prescilla Sours, FNP   predniSONE (DELTASONE) 20 MG tablet Take 1 tablet (20 mg total) by mouth daily with breakfast for 5 days. 5 tablet Prescilla Sours, FNP      PDMP not reviewed this encounter.   Prescilla Sours, FNP 05/23/23 1036

## 2023-05-23 NOTE — Discharge Instructions (Addendum)
 Exam shows sinusitis and early bronchitis.  Refilled her Promethazine DM, 5 mL, every 6 hours as needed for cough.  Doxycycline, 100 mg twice daily for 7 days for sinusitis.  Albuterol inhaler with spacer, 2 puffs every 4 hours if needed for wheezing.  Prednisone, 20 mg 1 daily for 5 days.  Prednisone prescription was printed and she will discuss it with her eye doctor before she starts it.  Get plenty of fluids and rest.  Follow-up here or with PCP, if symptoms do not improve, worsen or new symptoms occur.

## 2023-05-23 NOTE — ED Triage Notes (Signed)
 Pt reports coughing started last Thursday or Friday, pt reports dizziness started this morning. Pt did take some leftover cough medication.

## 2023-06-15 ENCOUNTER — Ambulatory Visit (HOSPITAL_BASED_OUTPATIENT_CLINIC_OR_DEPARTMENT_OTHER)
Admission: RE | Admit: 2023-06-15 | Discharge: 2023-06-15 | Disposition: A | Discharge status: Home / Self Care | Source: Ambulatory Visit | Attending: Emergency Medicine

## 2023-06-15 ENCOUNTER — Encounter (HOSPITAL_BASED_OUTPATIENT_CLINIC_OR_DEPARTMENT_OTHER): Payer: Self-pay

## 2023-06-15 ENCOUNTER — Other Ambulatory Visit (HOSPITAL_BASED_OUTPATIENT_CLINIC_OR_DEPARTMENT_OTHER): Payer: Self-pay

## 2023-06-15 VITALS — BP 134/87 | HR 78 | Temp 97.8°F | Resp 16

## 2023-06-15 DIAGNOSIS — A059 Bacterial foodborne intoxication, unspecified: Secondary | ICD-10-CM

## 2023-06-15 MED ORDER — ONDANSETRON 4 MG PO TBDP
4.0000 mg | ORAL_TABLET | Freq: Three times a day (TID) | ORAL | 0 refills | Status: AC | PRN
  Filled 2023-06-15: qty 20, 7d supply, fill #0

## 2023-06-15 MED ORDER — ONDANSETRON 4 MG PO TBDP
4.0000 mg | ORAL_TABLET | Freq: Once | ORAL | Status: AC
  Administered 2023-06-15: 4 mg via ORAL

## 2023-06-15 NOTE — Discharge Instructions (Signed)
 I suggest a clear liquid diet for the rest of the day such as Jell-O, broth, water and maybe a few sips of ginger ale.  If this goes well you can progress to a bland diet like bananas, rice, toast and applesauce.  Avoid fried or spicy foods, as these can further aggravate the stomach.  You can use the nausea medicine every 8 hours as needed.  Return to clinic if you continue to vomit and have diarrhea despite these interventions.

## 2023-06-15 NOTE — ED Provider Notes (Signed)
 Molly Ferrell [facility]    CSN: [id number] Arrival date & time: 06/15/23  1337      History   Chief Complaint No chief complaint on file.   HPI Molly Ferrell is a 73 y.o. female.   Patient presents to clinic over concern of nausea, vomiting and diarrhea that started shortly after eating a chicken sandwich from [employer] on Tuesday.  Has been able to eat and drink throughout the day, but finds her vomiting and diarrhea return at night and often will occur simultaneously.  Last emesis and last diarrhea was last night.  Has had water throughout the day today.  Denies any abdominal pain.  Her daughter also had Pia Brew, and was fine.  Has not had any fevers.  The history is provided by the patient and medical records.    Past Medical History:  Diagnosis Date   Arthritis    Cancer (HCC) 07/2021   Right Breast Cancer-Stage 0; 1 month of radation per pt   Complication of anesthesia    "many years ago, had problems waking up"; patient stated no problems with recent surgeries 01/21/2018   GERD (gastroesophageal reflux disease)    Hard of hearing    History of bronchitis    Hypercholesteremia    Hypertriglyceridemia    Hypothyroidism    Macular degeneration of both eyes    OA (osteoarthritis)    severe right knee   Spondylolisthesis of lumbar region    Wears glasses     Patient Active Problem List   Diagnosis Date Noted   Lumbar adjacent segment disease with spondylolisthesis 01/25/2022   Ductal carcinoma in situ (DCIS) of right breast 08/09/2021    Class: Diagnosis of   Spinal stenosis of lumbar region with neurogenic claudication 03/03/2019   Spondylolisthesis of lumbar region 01/30/2018   S/P total knee replacement 06/12/2016   S/P revision of total knee 06/14/2015    Past Surgical History:  Procedure Laterality Date   ABDOMINAL HYSTERECTOMY     APPENDECTOMY     BACK SURGERY     lumbar   BREAST LUMPECTOMY Right 07/2021   CATARACT EXTRACTION W/  INTRAOCULAR LENS IMPLANT Bilateral    COLONOSCOPY     ESOPHAGOGASTRODUODENOSCOPY     JOINT REPLACEMENT     bil hip replacements   KNEE ARTHROPLASTY Left    TONSILLECTOMY     TOTAL KNEE ARTHROPLASTY Right 06/12/2016   Procedure: TOTAL KNEE ARTHROPLASTY;  Surgeon: [name] Cox, MD;  Location: [facility] OR;  Service: Orthopedics;  Laterality: Right;   TOTAL KNEE REVISION Left 06/14/2015   Procedure: TOTAL KNEE REVISION POLY EXCHANGE;  Surgeon: [name] Cox, MD;  Location: [facility] OR;  Service: Orthopedics;  Laterality: Left;    OB History   No obstetric history on file.      Home Medications    Prior to Admission medications   Medication Sig Start Date End Date Taking? Authorizing Provider  ondansetron  (ZOFRAN -ODT) 4 MG disintegrating tablet Take 1 tablet (4 mg total) by mouth every 8 (eight) hours as needed for nausea or vomiting. 06/15/23  Yes Harlow Lighter, Dany Harten  N, FNP  albuterol  (VENTOLIN  HFA) 108 (90 Base) MCG/ACT inhaler Inhale 2 puffs into the lungs every 4 (four) hours as needed for wheezing or shortness of breath. 05/23/23   Guss Legacy, FNP  Ascorbic Acid (VITAMIN C) 1000 MG tablet Take 1,000 mg by mouth daily.    [provider]  aspirin  EC 81 MG tablet Take 81 mg by mouth 2 (two) times daily.  Swallow whole.    [provider]  atorvastatin  (LIPITOR) 10 MG tablet Take 10 mg by mouth daily. 01/20/19   [provider]  cholecalciferol  (VITAMIN D3) 25 MCG (1000 UNIT) tablet Take 1,000 Units by mouth in the morning and at bedtime.    [provider]  dicyclomine  (BENTYL ) 10 MG capsule Take 10 mg by mouth every 6 (six) hours as needed for spasms. 07/31/19   [provider]  docusate sodium  (COLACE) 100 MG capsule Take 1 capsule (100 mg total) by mouth 2 (two) times daily. 01/26/22   Garry Kansas, MD  fish oil-omega-3 fatty acids 1000 MG capsule Take 1 g by mouth daily.    [provider]  Lutein  20 MG TABS Take 20 mg by mouth 2 (two) times  daily.    [provider]  MAGNESIUM GLYCINATE PO Take 350 mg by mouth at bedtime.    [provider]  Melatonin 5 MG TABS Take 5 mg by mouth at bedtime.    [provider]  niacin  500 MG tablet Take 500 mg by mouth 2 (two) times daily.     [provider]  omeprazole (PRILOSEC) 40 MG capsule Take 40 mg by mouth daily. 02/16/21   [provider]  promethazine -dextromethorphan (PROMETHAZINE -DM) 6.25-15 MG/5ML syrup Take 5 mLs by mouth 4 (four) times daily as needed for cough. 05/23/23   Guss Legacy, FNP  Spacer/Aero-Holding Chambers (COMPACT SPACE CHAMBER) DEVI Use with the albuterol  inhaler 05/23/23   Guss Legacy, FNP  Testosterone  POWD Apply 1 Dose topically daily. Testosterone  Cream 2% - APPLY 0.5GM (2 CLICKS) TO LABIA ONCE A DAY.    [provider]  thyroid  (ARMOUR) 60 MG tablet Take 60 mg by mouth daily before breakfast.    [provider]  vitamin E 400 UNIT capsule Take 400 Units by mouth daily.    [provider]    Family History Family History  Problem Relation Age of Onset   Heart disease Mother    Heart disease Father     Social History Social History   Tobacco Use   Smoking status: Former   Smokeless tobacco: Never   Tobacco comments:    "quit 10-15 years ago" 06/03/15  Vaping Use   Vaping status: Never Used  Substance Use Topics   Alcohol  use: Yes    Alcohol /week: 2.0 standard drinks of alcohol     Types: 2 Glasses of wine per week    Comment: weekends   Drug use: No     Allergies   Penicillins   Review of Systems Review of Systems  Per HPI  Physical Exam Triage Vital Signs ED Triage Vitals [06/15/23 1414]  Encounter Vitals Group     BP 134/87     Systolic BP Percentile      Diastolic BP Percentile      Pulse Rate 78     Resp 16     Temp 97.8 F (36.6 C)     Temp src      SpO2 97 %     Weight      Height      Head Circumference      Peak Flow      Pain Score      Pain  Loc      Pain Education      Exclude from Growth Chart    No data found.  Updated Vital Signs BP 134/87 (BP Location: Right Arm)   Pulse 78  Temp 97.8 F (36.6 C)   Resp 16   SpO2 97%   Visual Acuity Right Eye Distance:   Left Eye Distance:   Bilateral Distance:    Right Eye Near:   Left Eye Near:    Bilateral Near:     Physical Exam Vitals and nursing note reviewed.  Constitutional:      Appearance: Normal appearance.  HENT:     Head: Normocephalic and atraumatic.     Right Ear: External ear normal.     Left Ear: External ear normal.     Nose: Nose normal.     Mouth/Throat:     Mouth: Mucous membranes are moist.  Eyes:     Conjunctiva/sclera: Conjunctivae normal.  Cardiovascular:     Rate and Rhythm: Normal rate.  Pulmonary:     Effort: Pulmonary effort is normal. No respiratory distress.  Neurological:     General: No focal deficit present.     Mental Status: She is alert.  Psychiatric:        Mood and Affect: Mood normal.      UC Treatments / Results  Labs (all labs ordered are listed, but only abnormal results are displayed) Labs Reviewed - No data to display  EKG   Radiology No results found.  Procedures Procedures (including critical care time)  Medications Ordered in UC Medications  ondansetron  (ZOFRAN -ODT) disintegrating tablet 4 mg (4 mg Oral Given 06/15/23 1424)    Initial Impression / Assessment and Plan / UC Course  I have reviewed the triage vital signs and the nursing notes.  Pertinent labs & imaging results that were available during my care of the patient were reviewed by me and considered in my medical decision making (see chart for details).  Vitals in triage reviewed, patient is hemodynamically stable.  Moist mucous membranes.  Without abdominal pain in clinic.  Tolerating liquids throughout the day.  Nausea improved with Zofran  administration.  Symptoms consistent with food poisoning, most likely from [employer]  sandwich.  Symptomatic management encouraged.  Plan of care, follow-up care return precautions given, no questions at this time.     Final Clinical Impressions(s) / UC Diagnoses   Final diagnoses:  Food poisoning     Discharge Instructions      I suggest a clear liquid diet for the rest of the day such as Jell-O, broth, water and maybe a few sips of ginger ale.  If this goes well you can progress to a bland diet like bananas, rice, toast and applesauce.  Avoid fried or spicy foods, as these can further aggravate the stomach.  You can use the nausea medicine every 8 hours as needed.  Return to clinic if you continue to vomit and have diarrhea despite these interventions.    ED Prescriptions     Medication Sig Dispense Auth. Provider   ondansetron  (ZOFRAN -ODT) 4 MG disintegrating tablet Take 1 tablet (4 mg total) by mouth every 8 (eight) hours as needed for nausea or vomiting. 20 tablet Harlow Lighter, Elim Peale  N, FNP      PDMP not reviewed this encounter.   Harlow Lighter, Markcus Lazenby  N, FNP 06/15/23 1455

## 2023-06-15 NOTE — ED Triage Notes (Signed)
 Patient presents to [facility] for vomiting and diarrhea since Tuesday. Ate [employer] Tuesday. Unable to tolerate po intake. No OTC meds.

## 2023-07-12 LAB — HM MAMMOGRAPHY

## 2023-07-26 NOTE — Progress Notes (Signed)
 [facility] - Clinic Note  08/02/2023   CHIEF COMPLAINT Patient presents for Retina Follow Up  HISTORY OF PRESENT ILLNESS: Molly Ferrell is a 73 y.o. female who presents to the clinic today for:  HPI     Retina Follow Up   Patient presents with  Wet AMD.  In both eyes.  This started 10 weeks ago.  Duration of 10 weeks.  Since onset it is stable.  I, the attending physician,  performed the HPI with the patient and updated documentation appropriately.        Comments   10 week retina follow up ARMD OD and I'VE OD pt states OS vision not as sharp she denies any flashes has some floaters pt has appointment with Dr Carloyn Chi at the end of June       Last edited by Ronelle Coffee, MD on 08/02/2023  4:27 PM.    Pt states VA OD is stable, OS seems to be getting worse.   Referring physician: Florance Hun, MD [address] [place],  [place] [address]  HISTORICAL INFORMATION:  Selected notes from the MEDICAL RECORD NUMBER Referred by Dr. Carloyn Chi for concern of exu ARMD OD LEE:  Ocular Hx- PMH-   CURRENT MEDICATIONS: No current outpatient medications on file. (Ophthalmic Drugs)   No current facility-administered medications for this visit. (Ophthalmic Drugs)   Current Outpatient Medications (Other)  Medication Sig   albuterol  (VENTOLIN  HFA) 108 (90 Base) MCG/ACT inhaler Inhale 2 puffs into the lungs every 4 (four) hours as needed for wheezing or shortness of breath.   Ascorbic Acid (VITAMIN C) 1000 MG tablet Take 1,000 mg by mouth daily.   aspirin  EC 81 MG tablet Take 81 mg by mouth 2 (two) times daily. Swallow whole.   atorvastatin  (LIPITOR) 10 MG tablet Take 10 mg by mouth daily.   cholecalciferol  (VITAMIN D3) 25 MCG (1000 UNIT) tablet Take 1,000 Units by mouth in the morning and at bedtime.   dicyclomine  (BENTYL ) 10 MG capsule Take 10 mg by mouth every 6 (six) hours as needed for spasms.   docusate sodium  (COLACE) 100 MG capsule Take 1 capsule (100 mg total) by  mouth 2 (two) times daily.   fish oil-omega-3 fatty acids 1000 MG capsule Take 1 g by mouth daily.   Lutein  20 MG TABS Take 20 mg by mouth 2 (two) times daily.   MAGNESIUM GLYCINATE PO Take 350 mg by mouth at bedtime.   Melatonin 5 MG TABS Take 5 mg by mouth at bedtime.   niacin  500 MG tablet Take 500 mg by mouth 2 (two) times daily.    omeprazole (PRILOSEC) 40 MG capsule Take 40 mg by mouth daily.   ondansetron  (ZOFRAN -ODT) 4 MG disintegrating tablet Take 1 tablet (4 mg total) by mouth every 8 (eight) hours as needed for nausea or vomiting.   promethazine -dextromethorphan (PROMETHAZINE -DM) 6.25-15 MG/5ML syrup Take 5 mLs by mouth 4 (four) times daily as needed for cough.   Spacer/Aero-Holding Chambers (COMPACT SPACE CHAMBER) DEVI Use with the albuterol  inhaler   Testosterone  POWD Apply 1 Dose topically daily. Testosterone  Cream 2% - APPLY 0.5GM (2 CLICKS) TO LABIA ONCE A DAY.   thyroid  (ARMOUR) 60 MG tablet Take 60 mg by mouth daily before breakfast.   vitamin E 400 UNIT capsule Take 400 Units by mouth daily.   No current facility-administered medications for this visit. (Other)   REVIEW OF SYSTEMS: ROS   Positive for: Endocrine, Cardiovascular, Eyes Negative for: Constitutional,  Gastrointestinal, Neurological, Skin, Musculoskeletal, HENT, Respiratory, Psychiatric, Allergic/Imm, Heme/Lymph Last edited by Alise Appl, COT on 08/02/2023  1:13 PM.     ALLERGIES Allergies  Allergen Reactions   Penicillins Hives and Itching    CHILDHOOD reaction to Pen G Has patient had a PCN reaction causing immediate rash, facial/tongue/throat swelling, SOB or lightheadedness with hypotension: No Has patient had a PCN reaction causing severe rash involving mucus membranes or skin necrosis: No Has patient had a PCN reaction that required hospitalization: No Has patient had a PCN reaction occurring within the last 10 years: No If all of the above answers are "NO", then may proceed with  Cephalosporin use.      PAST MEDICAL HISTORY Past Medical History:  Diagnosis Date   Arthritis    Cancer (HCC) 07/2021   Right Breast Cancer-Stage 0; 1 month of radation per pt   Complication of anesthesia    "many years ago, had problems waking up"; patient stated no problems with recent surgeries 01/21/2018   GERD (gastroesophageal reflux disease)    Hard of hearing    History of bronchitis    Hypercholesteremia    Hypertriglyceridemia    Hypothyroidism    Macular degeneration of both eyes    OA (osteoarthritis)    severe right knee   Spondylolisthesis of lumbar region    Wears glasses    Past Surgical History:  Procedure Laterality Date   ABDOMINAL HYSTERECTOMY     APPENDECTOMY     BACK SURGERY     lumbar   BREAST LUMPECTOMY Right 07/2021   CATARACT EXTRACTION W/ INTRAOCULAR LENS IMPLANT Bilateral    COLONOSCOPY     ESOPHAGOGASTRODUODENOSCOPY     JOINT REPLACEMENT     bil hip replacements   KNEE ARTHROPLASTY Left    TONSILLECTOMY     TOTAL KNEE ARTHROPLASTY Right 06/12/2016   Procedure: TOTAL KNEE ARTHROPLASTY;  Surgeon: [name] Cox, MD;  Location: [facility] OR;  Service: Orthopedics;  Laterality: Right;   TOTAL KNEE REVISION Left 06/14/2015   Procedure: TOTAL KNEE REVISION POLY EXCHANGE;  Surgeon: [name] Cox, MD;  Location: [facility] OR;  Service: Orthopedics;  Laterality: Left;   FAMILY HISTORY Family History  Problem Relation Age of Onset   Heart disease Mother    Heart disease Father    SOCIAL HISTORY Social History   Tobacco Use   Smoking status: Former   Smokeless tobacco: Never   Tobacco comments:    "quit 10-15 years ago" 06/03/15  Vaping Use   Vaping status: Never Used  Substance Use Topics   Alcohol  use: Yes    Alcohol /week: 2.0 standard drinks of alcohol     Types: 2 Glasses of wine per week    Comment: weekends   Drug use: No       OPHTHALMIC EXAM:  Base Eye Exam     Visual Acuity (Snellen - Linear)       Right Left   Dist [place] 20/30 -1 20/80  -2   Dist ph [place] NI NI         Tonometry (Tonopen, 1:16 PM)       Right Left   Pressure 13 13         Pupils       Pupils Dark Light Shape React APD   Right PERRL 3 2 Round Brisk None   Left PERRL 3 2 Round Brisk None         Visual Fields       Left Right  Full Full         Extraocular Movement       Right Left    Full, Ortho Full, Ortho         Neuro/Psych     Oriented x3: Yes   Mood/Affect: Normal         Dilation     Both eyes: 2.5% Phenylephrine  @ 1:16 PM           Slit Lamp and Fundus Exam     Slit Lamp Exam       Right Left   Lids/Lashes Dermatochalasis - upper lid Dermatochalasis - upper lid   Conjunctiva/Sclera White and quiet White and quiet   Cornea mild arcus, well healed cataract wound mild arcus, well healed cataract wound   Anterior Chamber deep and clear deep and clear   Iris Round and dilated Poorly dilated   Lens PC IOL in good position PC IOL in good position   Anterior Vitreous Vitreous syneresis Vitreous syneresis, mild asteroid hyalosis         Fundus Exam       Right Left   Disc Pink and Sharp, +PPP Pink and Sharp, mild PPA   C/D Ratio 0.4 0.2   Macula Flat, Blunted foveal reflex, refractile drusen, +CNV with shallow SRF IT macula -- stably resolved, cystic changes -- stably resolved, no frank heme, pigmented lesion along proximal ST arcades (approx 2DD) Flat, Blunted foveal reflex, refractile drusen, RPE mottling, clumping and early atrophy, No heme or edema   Vessels attenuated, Tortuous attenuated, Tortuous   Periphery Attached, No heme Attached, No heme           IMAGING AND PROCEDURES  Imaging and Procedures for 08/02/2023  OCT, Retina - OU - Both Eyes        Right Eye Quality was good. Central Foveal Thickness: 217. Progression has been stable. Findings include normal foveal contour, no IRF, no SRF, retinal drusen , subretinal hyper-reflective material, pigment epithelial detachment (Stable  resolution of IRF/cystic changes temporal macula, stable resolution of shallow SRF overlying PEDs IT macula and fovea, Partial PVD).   Left Eye Quality was good. Central Foveal Thickness: 215. Progression has been stable. Findings include normal foveal contour, no IRF, no SRF, retinal drusen , subretinal hyper-reflective material, pigment epithelial detachment, outer retinal atrophy (Scattered drusen with patchy ORA, partial PVD).   Notes  *Images captured and stored on drive  Diagnosis / Impression:  OD: exu ARMD -- Stable resolution of IRF/cystic changes temporal macula, stable resolution of shallow SRF overlying PEDs IT macula and fovea, Partial PVD OS: non-exu ARMD -- Scattered drusen with patchy ORA, partial PVD  Clinical management:  See below  Abbreviations: NFP - Normal foveal profile. CME - cystoid macular edema. PED - pigment epithelial detachment. IRF - intraretinal fluid. SRF - subretinal fluid. EZ - ellipsoid zone. ERM - epiretinal membrane. ORA - outer retinal atrophy. ORT - outer retinal tubulation. SRHM - subretinal hyper-reflective material. IRHM - intraretinal hyper-reflective material      Intravitreal Injection, Pharmacologic Agent - OD - Right Eye       Time Out 08/02/2023. 2:03 PM. Confirmed correct patient, procedure, site, and patient consented.   Anesthesia Topical anesthesia was used. Anesthetic medications included Lidocaine  2%, Proparacaine 0.5%.   Procedure Preparation included 5% betadine to ocular surface, eyelid speculum. A (32g) needle was used.   Injection: 2 mg aflibercept  2 MG/0.05ML   Route: Intravitreal, Site: Right Eye   NDC: Q956576, Lot: [id number], Expiration  date: 10/26/2024, Waste: 0 mL   Post-op Post injection exam found visual acuity of at least counting fingers. The patient tolerated the procedure well. There were no complications. The patient received written and verbal post procedure care education.            ASSESSMENT/PLAN:   ICD-10-CM   1. Exudative age-related macular degeneration of right eye with active choroidal neovascularization (HCC)  H35.3211 OCT, Retina - OU - Both Eyes    Intravitreal Injection, Pharmacologic Agent - OD - Right Eye    aflibercept  (EYLEA ) SOLN 2 mg    2. Intermediate stage nonexudative age-related macular degeneration of left eye  H35.3122     3. Asteroid hyalosis of left eye  H43.22     4. Nevus of choroid of right eye  D31.31     5. Pseudophakia, both eyes  Z96.1      Exudative age related macular degeneration, OD  - s/p IVA OD #1 (05.08.24), #2 (06.05.24), #3 (07.03.24), #4 (07.31.24), #5 (08.28.24) -- IVA resistance  - s/p IVE OD #1 (09.26.24), #2 (10.30.24), #3 (12.04.24), #4 (01.22.25), #5 (03.26.25)  - BCVA 20/30 OD -- stable  - OCT OD shows OD: Stable resolution of IRF/cystic changes temporal macula, stable resolution of shallow SRF overlying PEDs IT macula and fovea, Partial PVD at 10+ wks  - recommend IVE OD #6 today, 06.05.25 w/ f/u extended to 13 wks - pt wishes to proceed with injection - RBA of procedure discussed, questions answered - IVE informed consent obtained and signed, 09.26.24 - see procedure note  - Eylea  authorization approved for 2025 -- but no funding for [employer] -- pt is covering 20%  - f/u in 13 wks -- DFE/OCT, possible injection  2. Age related macular degeneration, non-exudative, left eye  - The incidence, anatomy, and pathology of dry AMD, risk of progression, and the AREDS and AREDS 2 study including smoking risks discussed with patient.  - Recommend amsler grid monitoring - OCT OS: non-exu ARMD -- Scattered drusen with patchy ORA, partial PVD  - f/u 3 months, DFE, OCT  3. Asteroid hyalosis OS  4. Choroidal Nevus, OD  - pigmented lesion along proximal arcades  - no visual symptoms, SRF or orange pigment  - +drusen  - thickness < 2mm  - discussed findings, prognosis  - monitoring  5. Pseudophakia OU  - s/p CE/IOL  OU (CEA, [place], [place])  - IOL in good position, doing well  - monitor  Ophthalmic Meds Ordered this visit:  Meds ordered this encounter  Medications   aflibercept  (EYLEA ) SOLN 2 mg     Return in about 13 weeks (around 11/01/2023) for exu ARMD OD, DFE, OCT, likely IVE OD.  There are no Patient Instructions on file for this visit.  Explained the diagnoses, plan, and follow up with the patient and they expressed understanding.  Patient expressed understanding of the importance of proper follow up care.   This document serves as a record of services personally performed by Jeanice Millard, MD, PhD. It was created on their behalf by Morley Arabia. Bevin Bucks, OA an ophthalmic technician. The creation of this record is the provider's dictation and/or activities during the visit.    Electronically signed by: Morley Arabia. Bevin Bucks, OA 08/02/23 4:28 PM  This document serves as a record of services personally performed by Jeanice Millard, MD, PhD. It was created on their behalf by Angelia Kelp, an ophthalmic technician. The creation of this record is the provider's dictation and/or  activities during the visit.    Electronically signed by: Angelia Kelp, OA, 08/02/23  4:28 PM   Jeanice Millard, M.D., Ph.D. Diseases & Surgery of the Retina and Vitreous [facility] 08/02/2023  I have reviewed the above documentation for accuracy and completeness, and I agree with the above. Jeanice Millard, M.D., Ph.D. 08/02/23 4:30 PM   Abbreviations: M myopia (nearsighted); A astigmatism; H hyperopia (farsighted); P presbyopia; Mrx spectacle prescription;  CTL contact lenses; OD right eye; OS left eye; OU both eyes  XT exotropia; ET esotropia; PEK punctate epithelial keratitis; PEE punctate epithelial erosions; DES dry eye syndrome; MGD meibomian gland dysfunction; ATs artificial tears; PFAT's preservative free artificial tears; NSC nuclear sclerotic cataract; PSC posterior subcapsular cataract; ERM  epi-retinal membrane; PVD posterior vitreous detachment; RD retinal detachment; DM diabetes mellitus; DR diabetic retinopathy; NPDR non-proliferative diabetic retinopathy; PDR proliferative diabetic retinopathy; CSME clinically significant macular edema; DME diabetic macular edema; dbh dot blot hemorrhages; CWS cotton wool spot; POAG primary open angle glaucoma; C/D cup-to-disc ratio; HVF humphrey visual field; GVF goldmann visual field; OCT optical coherence tomography; IOP intraocular pressure; BRVO Branch retinal vein occlusion; CRVO central retinal vein occlusion; CRAO central retinal artery occlusion; BRAO branch retinal artery occlusion; RT retinal tear; SB scleral buckle; PPV pars plana vitrectomy; VH Vitreous hemorrhage; PRP panretinal laser photocoagulation; IVK intravitreal kenalog; VMT vitreomacular traction; MH Macular hole;  NVD neovascularization of the disc; NVE neovascularization elsewhere; AREDS age related eye disease study; ARMD age related macular degeneration; POAG primary open angle glaucoma; EBMD epithelial/anterior basement membrane dystrophy; ACIOL anterior chamber intraocular lens; IOL intraocular lens; PCIOL posterior chamber intraocular lens; Phaco/IOL phacoemulsification with intraocular lens placement; PRK photorefractive keratectomy; LASIK laser assisted in situ keratomileusis; HTN hypertension; DM diabetes mellitus; COPD chronic obstructive pulmonary disease

## 2023-08-01 ENCOUNTER — Encounter (INDEPENDENT_AMBULATORY_CARE_PROVIDER_SITE_OTHER): Admitting: Ophthalmology

## 2023-08-02 ENCOUNTER — Ambulatory Visit (INDEPENDENT_AMBULATORY_CARE_PROVIDER_SITE_OTHER): Admitting: Ophthalmology

## 2023-08-02 ENCOUNTER — Encounter (INDEPENDENT_AMBULATORY_CARE_PROVIDER_SITE_OTHER): Payer: Self-pay | Admitting: Ophthalmology

## 2023-08-02 DIAGNOSIS — H353122 Nonexudative age-related macular degeneration, left eye, intermediate dry stage: Secondary | ICD-10-CM | POA: Diagnosis not present

## 2023-08-02 DIAGNOSIS — H4322 Crystalline deposits in vitreous body, left eye: Secondary | ICD-10-CM | POA: Diagnosis not present

## 2023-08-02 DIAGNOSIS — D3131 Benign neoplasm of right choroid: Secondary | ICD-10-CM

## 2023-08-02 DIAGNOSIS — H353211 Exudative age-related macular degeneration, right eye, with active choroidal neovascularization: Secondary | ICD-10-CM | POA: Diagnosis not present

## 2023-08-02 DIAGNOSIS — Z961 Presence of intraocular lens: Secondary | ICD-10-CM

## 2023-08-02 MED ORDER — AFLIBERCEPT 2MG/0.05ML IZ SOLN FOR KALEIDOSCOPE
2.0000 mg | INTRAVITREAL | Status: AC | PRN
  Administered 2023-08-02: 2 mg via INTRAVITREAL

## 2023-09-14 NOTE — Progress Notes (Signed)
 [facility]  [address] [place],  [place]  [address] [phone]  Clinic Date: 09/18/2023  Patient ID: Molly Ferrell   Chief complaint: Breast cancer   Treatment: Surveillance  HPI Molly Ferrell is here today to discuss her breast cancer that was found on a screening mammogram by calcifications of the right breast. She regularly gets them every year. She went on to have a diagnostic mammogram, which showed suspicious calcifications in a linear distribution spanning 2.5 cm in the medial right breast. She  had a biopsy on Jul 08, 2021 which revealed DCIS. On August 08, 2021, she had lumpectomy by Dr.Moorhead, and he went back for further excisions to be sure of clear margins.She is healing well, and just feels sore. She has seen Dr.Palermo and has been scheduled for radiation. The final pathology revealed an 8 mm grade 3 ductal carcinoma in situ with micro-calcifications and necrosis.  All margins were clear. Estrogen receptors were positive at 80% and progesterone  were also positive at 80%.  The patient is still on hormone replacement therapy with estradiol  2 mg daily, progesterone  at night and testosterone  cream. She was started on this since her hysterectomy at age 1. She had her first period at 73 years old. She had a hysterectomy at 73 years old for endometriosis but she has half an ovary remaining. She is nulliparous.     INTERVAL HISTORY Molly Ferrell is here today to follow up regarding her DCIS. Patient states that she feels ok but complains of back pain rating 7/10. She had a bilateral diagnostic mammogram done on 07/12/2023 which was clear. Patient inquired if Diindolylmethane (DIM) was safe for her as this is supposed to aid in hot flashes. I reassured her that this seems safe after reading more on it and also recommended Effexor  as something stronger to help with her hot flashes. I will prescribe Effexor  37.5mg  once daily and we may need to increase to 75mg . I will then see her  back in 1 year with bilateral diagnostic mammogram. She denies fever, chills, night sweats, or other signs of infection. She denies cardiorespiratory and gastrointestinal issues. She  denies pain. Her appetite is decreased and Her weight has decreased 3 pounds over last year.   It   Past Medical History:  Diagnosis Date   Arthritis    Cancer (HCC) 07/2021   Right Breast Cancer-Stage 0; 1 month of radation per pt   Complication of anesthesia    many years ago, had problems waking up; patient stated no problems with recent surgeries 01/21/2018   GERD (gastroesophageal reflux disease)    Hard of hearing    History of bronchitis    Hypercholesteremia    Hypertriglyceridemia    Hypothyroidism    Macular degeneration of both eyes    OA (osteoarthritis)    severe right knee   Spondylolisthesis of lumbar region    Wears glasses    Past Surgical History:  Procedure Laterality Date   ABDOMINAL HYSTERECTOMY     APPENDECTOMY     BACK SURGERY     lumbar   BREAST LUMPECTOMY Right 07/2021   CATARACT EXTRACTION W/ INTRAOCULAR LENS IMPLANT Bilateral    COLONOSCOPY     ESOPHAGOGASTRODUODENOSCOPY     JOINT REPLACEMENT     bil hip replacements   KNEE ARTHROPLASTY Left    TONSILLECTOMY     TOTAL KNEE ARTHROPLASTY Right 06/12/2016   Procedure: TOTAL KNEE ARTHROPLASTY;  Surgeon: Marcey Raman, MD;  Location: [facility] OR;  Service:  Orthopedics;  Laterality: Right;   TOTAL KNEE REVISION Left 06/14/2015   Procedure: TOTAL KNEE REVISION POLY EXCHANGE;  Surgeon: Marcey Raman, MD;  Location: [facility] OR;  Service: Orthopedics;  Laterality: Left;   Family History  Problem Relation Age of Onset   Heart disease Mother    Heart disease Father   Fathers side (all sisters) had breast cancer     SOCIAL HISTORY She Is married of 53 years She is a former smoker but quit 15 years ago She drinks alcohol  occasionally (weekend)   Social History         Socioeconomic History   Marital status: Married      Spouse  name: Not on file   Number of children: Not on file   Years of education: Not on file   Highest education level: Not on file  Occupational History   Not on file  Tobacco Use   Smoking status: Former   Smokeless tobacco: Never   Tobacco comments:      quit 10-15 years ago 06/03/15  Vaping Use   Vaping Use: Never used  Substance and Sexual Activity   Alcohol  use: Yes      Alcohol /week: 1.0 standard drink of alcohol       Types: 1 Glasses of wine per week      Comment: weekends   Drug use: No   Sexual activity: Not on file  Other Topics Concern   Not on file  Social History Narrative   Not on file    Social Determinants of Health    Financial Resource Strain: Not on file  Food Insecurity: Not on file  Transportation Needs: Not on file  Physical Activity: Not on file  Stress: Not on file  Social Connections: Not on file  Intimate Partner Violence: Not on file            Outpatient Medications Prior to Visit  Medication Sig Dispense Refill   Ascorbic Acid (VITAMIN C) 1000 MG tablet Take 1,000 mg by mouth daily.       aspirin  EC 81 MG tablet Take 81 mg by mouth 2 (two) times daily.        atorvastatin  (LIPITOR) 10 MG tablet Take 10 mg by mouth daily.       Cholecalciferol  (VITAMIN D ) 2000 units CAPS Take 2,000 Units by mouth daily.       cyclobenzaprine  (FLEXERIL ) 10 MG tablet Take 1 tablet (10 mg total) by mouth 3 (three) times daily as needed for muscle spasms. 50 tablet 0   docusate sodium  (COLACE) 100 MG capsule Take 1 capsule (100 mg total) by mouth 2 (two) times daily. 60 capsule 0   estradiol  (ESTRACE ) 2 MG tablet Take 2 mg by mouth daily.       Lutein  40 MG CAPS Take 40 mg by mouth 2 (two) times daily. MORNING & AFTERNOON       MAGNESIUM GLYCINATE PO Take 400 mg by mouth at bedtime.       Melatonin 5 MG TABS Take 5 mg by mouth at bedtime.       niacin  500 MG tablet Take 500 mg by mouth 2 (two) times daily.        Omega-3 Fatty Acids (FISH OIL PO) Take 1,280 mg by  mouth 2 (two) times daily.        omeprazole (PRILOSEC) 40 MG capsule Take 40 mg by mouth daily.       OVER THE COUNTER MEDICATION Take 1 capsule by mouth  daily. DIM Enhanced Delivery System       oxyCODONE  (OXY IR/ROXICODONE ) 5 MG immediate release tablet Take 1 tablet (5 mg total) by mouth every 4 (four) hours as needed for moderate pain ((score 4 to 6)). 30 tablet 0   progesterone  (PROMETRIUM ) 200 MG capsule Take 400 mg by mouth at bedtime. Compounded at [facility] in [place].   1   Propylene Glycol (SYSTANE COMPLETE) 0.6 % SOLN Place 1 drop into both eyes daily.       Testosterone  POWD Apply 1 application topically at bedtime. Testosterone  Cream 0.25%       thyroid  (ARMOUR) 60 MG tablet Take 60 mg by mouth daily before breakfast.       vitamin E 400 UNIT capsule Take 400 Units by mouth daily.        No facility-administered medications prior to visit.           Allergies  Allergen Reactions   Penicillins Hives and Itching      CHILDHOOD reaction to Pen G Has patient had a PCN reaction causing immediate rash, facial/tongue/throat swelling, SOB or lightheadedness with hypotension: No Has patient had a PCN reaction causing severe rash involving mucus membranes or skin necrosis: No Has patient had a PCN reaction that required hospitalization: No Has patient had a PCN reaction occurring within the last 10 years: No If all of the above answers are NO, then may proceed with Cephalosporin use.          Objective:  Review of Systems  Constitutional: Negative.  Negative for appetite change, chills, diaphoresis, fatigue, fever and unexpected weight change.  HENT:  Negative.  Negative for hearing loss, lump/mass, mouth sores, nosebleeds, sore throat, tinnitus, trouble swallowing and voice change.   Eyes: Negative.  Negative for eye problems and icterus.  Respiratory: Negative.  Negative for chest tightness, cough, hemoptysis, shortness of breath and wheezing.   Cardiovascular: Negative.   Negative for chest pain, leg swelling and palpitations.  Gastrointestinal: Negative.  Negative for abdominal distention, abdominal pain, blood in stool, constipation, diarrhea, nausea, rectal pain and vomiting.  Endocrine: Positive for hot flashes.  Genitourinary: Negative.  Negative for bladder incontinence, difficulty urinating, dyspareunia, dysuria, frequency, hematuria, menstrual problem, nocturia, pelvic pain, vaginal bleeding and vaginal discharge.   Musculoskeletal:  Positive for arthralgias and back pain (7/10). Negative for flank pain, gait problem, myalgias, neck pain and neck stiffness.  Skin: Negative.  Negative for itching, rash and wound.  Neurological: Negative.  Negative for dizziness, extremity weakness, gait problem, headaches, light-headedness, numbness, seizures and speech difficulty.  Hematological: Negative.  Negative for adenopathy. Does not bruise/bleed easily.  Psychiatric/Behavioral: Negative.  Negative for depression. The patient is not nervous/anxious.     Vitals Vitals:   09/18/23 1339  BP: 120/73  Pulse: 63  Resp: 18  Temp: 97.8 F (36.6 C)  SpO2: 99%   Wt Readings from Last 3 Encounters:  09/18/23 139 lb 8 oz (63.3 kg)  09/12/22 142 lb 8 oz (64.6 kg)  03/14/22 145 lb 6.4 oz (66 kg)   Performance status (ECOG): 1 - Symptomatic but completely ambulatory  Physical Exam Vitals and nursing note reviewed.  Constitutional:      General: She is not in acute distress.    Appearance: Normal appearance. She is normal weight. She is not ill-appearing, toxic-appearing or diaphoretic.  HENT:     Head: Normocephalic and atraumatic.     Right Ear: Tympanic membrane, ear canal and external ear normal. There is no  impacted cerumen.     Left Ear: Tympanic membrane, ear canal and external ear normal. There is no impacted cerumen.     Nose: Nose normal. No congestion or rhinorrhea.     Mouth/Throat:     Mouth: Mucous membranes are moist.     Pharynx: Oropharynx is  clear. No oropharyngeal exudate or posterior oropharyngeal erythema.  Eyes:     General: No scleral icterus.       Right eye: No discharge.        Left eye: No discharge.     Extraocular Movements: Extraocular movements intact.     Conjunctiva/sclera: Conjunctivae normal.     Pupils: Pupils are equal, round, and reactive to light.  Neck:     Vascular: No carotid bruit.  Cardiovascular:     Rate and Rhythm: Normal rate and regular rhythm.     Pulses: Normal pulses.     Heart sounds: Normal heart sounds. No murmur heard.    No friction rub. No gallop.  Pulmonary:     Effort: Pulmonary effort is normal. No respiratory distress.     Breath sounds: Normal breath sounds. No stridor. No wheezing, rhonchi or rales.     Comments: Long incision of the lower back which is healing well. Chest:     Chest wall: No mass or tenderness.  Breasts:    Right: No mass.     Left: No mass.     Comments: Deep scar in the upper inner quadrant of the right breast with deformity and lymphedema of the nipple areolar complex No masses in either breast Abdominal:     General: Bowel sounds are normal. There is no distension.     Palpations: There is no mass.     Tenderness: There is no abdominal tenderness. There is no right CVA tenderness, left CVA tenderness, guarding or rebound.     Hernia: No hernia is present.  Musculoskeletal:        General: No swelling or deformity. Normal range of motion.     Cervical back: Normal range of motion and neck supple. No rigidity or tenderness.     Right lower leg: No edema.  Lymphadenopathy:     Cervical: No cervical adenopathy.  Skin:    General: Skin is warm and dry.     Coloration: Skin is not jaundiced or pale.     Findings: No bruising, erythema, lesion or rash.  Neurological:     General: No focal deficit present.     Mental Status: She is alert and oriented to person, place, and time. Mental status is at baseline.     Cranial Nerves: No cranial nerve  deficit.     Sensory: No sensory deficit.     Motor: No weakness.     Coordination: Coordination normal.     Gait: Gait normal.     Deep Tendon Reflexes: Reflexes normal.  Psychiatric:        Mood and Affect: Mood normal.        Behavior: Behavior normal.        Thought Content: Thought content normal.        Judgment: Judgment normal.         LABS:   Lab Results  Component Value Date   WBC 10.1 01/11/2022   HGB 16.7 (H) 01/11/2022   HCT 49.0 (H) 01/11/2022   MCV 92.5 01/11/2022   PLT 206 01/11/2022   Lab Results  Component Value Date   CREATININE 0.8 09/09/2021  BUN 19 09/09/2021   NA 138 09/09/2021   K 4.0 09/09/2021   CL 107 09/09/2021   CO2 21 09/09/2021      Component Value Date/Time   PROT 7.1 06/01/2016 0952   ALBUMIN 4.7 09/09/2021 0000   AST 27 09/09/2021 0000   ALT 27 09/09/2021 0000   ALKPHOS 48 09/09/2021 0000   BILITOT 0.7 06/01/2016 0952    STUDIES:  EXAM: 05/24/2022 DIGITAL DIAGNOSTIC BILATERAL MAMMOGRAM WITH TOMOSYNTHESIS IMPRESSION: No evidence of malignancy in either breast  EXAM: 01/25/2022 LUMBAR SPINE - 2-3 VIEW IMPRESSION: Fluoroscopic spot images of the lumbar spine during posterior lumbar fusion.  Assessment & Plan:  Ductal carcinoma in situ right breast She has had resection and has completed radiotherapy.  I reviewed the diagnosis and pathology report. I emphasized her excellent prognosis.  She has been weaned off her hormone replacement therapy.  I offered chemoprevention with raloxifene  and she has declined. She has no evidence of disease.   Plan:  She had a bilateral diagnostic mammogram done on 07/12/2023 which was clear. Patient inquired if Diindolylmethane (DIM) was safe for her as this is supposed to aid in hot flashes. I reassured her that this seems safe after reading more on it and also recommended Effexor  as something stronger to help with her hot flashes. I will prescribe Effexor  37.5 mg once daily and we may need to  increase to 75 mg. I will then see her back in 1 year with bilateral diagnostic mammogram.  Her questions were answered and she understand and agrees with this plan of care.  She knows to contact our office if she develops concerns prior to her next appointment.   I provided 13 minutes of face-to-face time during this encounter and > 50% was spent counseling as documented under my assessment and plan.    Wanda VEAR Cornish, MD  [facility] OF [facility] [address] [place] [address] Dept: [phone] Dept Fax: [phone]   No orders of the defined types were placed in this encounter.   I,Jasmine M Lassiter,acting as a scribe for Wanda VEAR Cornish, MD.,have documented all relevant documentation on the behalf of Wanda VEAR Cornish, MD,as directed by  Wanda VEAR Cornish, MD while in the presence of Wanda VEAR Cornish, MD.

## 2023-09-18 ENCOUNTER — Other Ambulatory Visit: Payer: Self-pay | Admitting: Oncology

## 2023-09-18 ENCOUNTER — Ambulatory Visit: Payer: PPO | Admitting: Oncology

## 2023-09-18 ENCOUNTER — Telehealth: Payer: Self-pay | Admitting: Oncology

## 2023-09-18 ENCOUNTER — Encounter: Payer: Self-pay | Admitting: Oncology

## 2023-09-18 ENCOUNTER — Inpatient Hospital Stay: Attending: Oncology | Admitting: Oncology

## 2023-09-18 VITALS — BP 120/73 | HR 63 | Temp 97.8°F | Resp 18 | Ht 60.0 in | Wt 139.5 lb

## 2023-09-18 DIAGNOSIS — Z923 Personal history of irradiation: Secondary | ICD-10-CM | POA: Insufficient documentation

## 2023-09-18 DIAGNOSIS — Z86000 Personal history of in-situ neoplasm of breast: Secondary | ICD-10-CM | POA: Insufficient documentation

## 2023-09-18 DIAGNOSIS — D0511 Intraductal carcinoma in situ of right breast: Secondary | ICD-10-CM

## 2023-09-18 MED ORDER — VENLAFAXINE HCL ER 37.5 MG PO CP24
37.5000 mg | ORAL_CAPSULE | Freq: Every day | ORAL | 5 refills | Status: AC
Start: 2023-09-18 — End: ?

## 2023-09-18 NOTE — Telephone Encounter (Signed)
 Patient has been scheduled for follow-up visit per 09/17/23 LOS.  Pt given an appt calendar with date and time.

## 2023-10-24 NOTE — Progress Notes (Signed)
 [facility] - Clinic Note  10/31/2023   CHIEF COMPLAINT Patient presents for Retina Follow Up  HISTORY OF PRESENT ILLNESS: Molly Ferrell is a 73 y.o. female who presents to the clinic today for:  HPI     Retina Follow Up   Patient presents with  Wet AMD.  In both eyes.  This started 16 months ago.  Duration of 3 months.  Since onset it is stable.  I, the attending physician,  performed the HPI with the patient and updated documentation appropriately.        Comments   Pt states vision in the left eye has decreased, she was seen last month at [facility] and was told to have Dr. Valdemar double check it. Pt denies FOL/new floaters/pain. Pt uses ats bid OU most days.      Last edited by [name], MD on 10/31/2023 10:05 PM.     Patient  Referring physician: Fleeta Zerita DASEN, MD [address] [place],  [place] [address]  HISTORICAL INFORMATION:  Selected notes from the MEDICAL RECORD NUMBER Referred by Dr. Fleeta for concern of exu ARMD OD LEE:  Ocular Hx- PMH-   CURRENT MEDICATIONS: No current outpatient medications on file. (Ophthalmic Drugs)   No current facility-administered medications for this visit. (Ophthalmic Drugs)   Current Outpatient Medications (Other)  Medication Sig   albuterol  (VENTOLIN  HFA) 108 (90 Base) MCG/ACT inhaler Inhale 2 puffs into the lungs every 4 (four) hours as needed for wheezing or shortness of breath.   Ascorbic Acid (VITAMIN C) 1000 MG tablet Take 1,000 mg by mouth daily.   aspirin  EC 81 MG tablet Take 81 mg by mouth 2 (two) times daily. Swallow whole.   atorvastatin  (LIPITOR) 20 MG tablet Take 20 mg by mouth daily.   cholecalciferol  (VITAMIN D3) 25 MCG (1000 UNIT) tablet Take 1,000 Units by mouth in the morning and at bedtime.   dicyclomine  (BENTYL ) 10 MG capsule Take 10 mg by mouth every 6 (six) hours as needed for spasms.   docusate sodium  (COLACE) 100 MG capsule Take 1 capsule (100 mg total) by mouth 2 (two)  times daily.   fish oil-omega-3 fatty acids 1000 MG capsule Take 1 g by mouth daily.   Lutein  20 MG TABS Take 20 mg by mouth 2 (two) times daily.   MAGNESIUM GLYCINATE PO Take 350 mg by mouth at bedtime.   Melatonin 5 MG TABS Take 5 mg by mouth at bedtime.   niacin  500 MG tablet Take 500 mg by mouth 2 (two) times daily.    omeprazole (PRILOSEC) 40 MG capsule Take 40 mg by mouth daily.   ondansetron  (ZOFRAN -ODT) 4 MG disintegrating tablet Take 1 tablet (4 mg total) by mouth every 8 (eight) hours as needed for nausea or vomiting.   promethazine -dextromethorphan (PROMETHAZINE -DM) 6.25-15 MG/5ML syrup Take 5 mLs by mouth 4 (four) times daily as needed for cough.   Spacer/Aero-Holding Chambers (COMPACT SPACE CHAMBER) DEVI Use with the albuterol  inhaler   Testosterone  POWD Apply 1 Dose topically daily. Testosterone  Cream 2% - APPLY 0.5GM (2 CLICKS) TO LABIA ONCE A DAY.   thyroid  (ARMOUR) 60 MG tablet Take 60 mg by mouth daily before breakfast.   venlafaxine  XR (EFFEXOR -XR) 37.5 MG 24 hr capsule Take 1 capsule (37.5 mg total) by mouth daily with breakfast.   vitamin E 400 UNIT capsule Take 400 Units by mouth daily.   No current facility-administered medications for this visit. (Other)   REVIEW OF SYSTEMS:  ROS   Positive for: Endocrine, Cardiovascular, Eyes Negative for: Constitutional, Gastrointestinal, Neurological, Skin, Musculoskeletal, HENT, Respiratory, Psychiatric, Allergic/Imm, Heme/Lymph Last edited by Elnor Avelina RAMAN, COT on 10/31/2023 12:46 PM.     ALLERGIES Allergies  Allergen Reactions   Penicillins Hives and Itching    CHILDHOOD reaction to Pen G  Has patient had a PCN reaction causing immediate rash, facial/tongue/throat swelling, SOB or lightheadedness with hypotension: No  Has patient had a PCN reaction causing severe rash involving mucus membranes or skin necrosis: No  Has patient had a PCN reaction that required hospitalization: No  Has patient had a PCN reaction  occurring within the last 10 years: No  If all of the above answers are NO, then may proceed with Cephalosporin use.  CHILDHOOD reaction to Pen G, Has patient had a PCN reaction causing immediate rash, facial/tongue/throat swelling, SOB or lightheadedness with hypotension: No, Has patient had a PCN reaction causing severe rash involving mucus membranes or skin necrosis: No, Has patient had a PCN reaction that required hospitalization: No, Has patient had a PCN reaction occurring within the last 10 years: No, If all of the above answers are NO, then may proceed with Cephalosporin use.   PAST MEDICAL HISTORY Past Medical History:  Diagnosis Date   Arthritis    Cancer (HCC) 07/2021   Right Breast Cancer-Stage 0; 1 month of radation per pt   Complication of anesthesia    many years ago, had problems waking up; patient stated no problems with recent surgeries 01/21/2018   GERD (gastroesophageal reflux disease)    Hard of hearing    History of bronchitis    Hypercholesteremia    Hypertriglyceridemia    Hypothyroidism    Macular degeneration of both eyes    OA (osteoarthritis)    severe right knee   Spondylolisthesis of lumbar region    Wears glasses    Past Surgical History:  Procedure Laterality Date   ABDOMINAL HYSTERECTOMY     APPENDECTOMY     BACK SURGERY     lumbar   BREAST LUMPECTOMY Right 07/2021   CATARACT EXTRACTION W/ INTRAOCULAR LENS IMPLANT Bilateral    COLONOSCOPY     ESOPHAGOGASTRODUODENOSCOPY     JOINT REPLACEMENT     bil hip replacements   KNEE ARTHROPLASTY Left    TONSILLECTOMY     TOTAL KNEE ARTHROPLASTY Right 06/12/2016   Procedure: TOTAL KNEE ARTHROPLASTY;  Surgeon: Marcey Raman, MD;  Location: [facility] OR;  Service: Orthopedics;  Laterality: Right;   TOTAL KNEE REVISION Left 06/14/2015   Procedure: TOTAL KNEE REVISION POLY EXCHANGE;  Surgeon: Marcey Raman, MD;  Location: [facility] OR;  Service: Orthopedics;  Laterality: Left;   FAMILY HISTORY Family History   Problem Relation Age of Onset   Heart disease Mother    Heart disease Father    SOCIAL HISTORY Social History   Tobacco Use   Smoking status: Former   Smokeless tobacco: Never   Tobacco comments:    quit 10-15 years ago 06/03/15  Vaping Use   Vaping status: Never Used  Substance Use Topics   Alcohol  use: Yes    Alcohol /week: 2.0 standard drinks of alcohol     Types: 2 Glasses of wine per week    Comment: weekends   Drug use: No       OPHTHALMIC EXAM:  Base Eye Exam     Visual Acuity (Snellen - Linear)       Right Left   Dist [place] 20/30 -1 20/150 -3  Tonometry (Tonopen, 1:00 PM)       Right Left   Pressure 17 13         Pupils       Pupils Dark Light Shape React APD   Right PERRL 3 2 Round Brisk None   Left PERRL 3 2 Round Brisk None         Visual Fields       Left Right    Full Full         Extraocular Movement       Right Left    Full, Ortho Full, Ortho         Neuro/Psych     Oriented x3: Yes   Mood/Affect: Normal         Dilation     Both eyes: 1.0% Mydriacyl, 2.5% Phenylephrine  @ 1:00 PM           Slit Lamp and Fundus Exam     Slit Lamp Exam       Right Left   Lids/Lashes Dermatochalasis - upper lid Dermatochalasis - upper lid   Conjunctiva/Sclera White and quiet White and quiet   Cornea mild arcus, well healed cataract wound mild arcus, well healed cataract wound   Anterior Chamber deep and clear deep and clear   Iris Round and dilated Poorly dilated   Lens PC IOL in good position PC IOL in good position   Anterior Vitreous Vitreous syneresis Vitreous syneresis, mild asteroid hyalosis         Fundus Exam       Right Left   Disc Pink and Sharp, +PPP Pink and Sharp, mild PPA   C/D Ratio 0.4 0.2   Macula Flat, Blunted foveal reflex, refractile drusen, +CNV with shallow SRF IT macula -- stably resolved, cystic changes -- stably resolved, no frank heme, pigmented lesion along proximal ST arcades (approx  2DD) Flat, Blunted foveal reflex, refractile drusen, RPE mottling, clumping and atrophy, No heme or edema   Vessels attenuated, Tortuous attenuated, Tortuous   Periphery Attached, No heme Attached, No heme           Refraction     Manifest Refraction (Subjective)       Sphere Cylinder Axis Dist VA   Right       Left -1.00 +0.25 002 100+1         Manifest Refraction #2 (Auto)       Sphere Cylinder Axis Dist VA   Right [place] +0.50 139    Left -0.50 +0.25 128            IMAGING AND PROCEDURES  Imaging and Procedures for 10/31/2023  OCT, Retina - OU - Both Eyes       Right Eye Quality was good. Central Foveal Thickness: 217. Progression has been stable. Findings include normal foveal contour, no IRF, no SRF, retinal drusen , subretinal hyper-reflective material, pigment epithelial detachment (Stable resolution of IRF/cystic changes temporal macula, stable resolution of shallow SRF overlying PEDs IT macula and fovea, Partial PVD).   Left Eye Quality was good. Central Foveal Thickness: 212. Progression has been stable. Findings include normal foveal contour, no IRF, no SRF, retinal drusen , subretinal hyper-reflective material, pigment epithelial detachment, outer retinal atrophy (Scattered drusen with patchy ORA, partial PVD).   Notes *Images captured and stored on drive  Diagnosis / Impression:  OD: exu ARMD -- Stable resolution of IRF/cystic changes temporal macula, stable resolution of shallow SRF overlying PEDs IT macula and fovea, Partial PVD  OS: non-exu ARMD -- Scattered drusen with patchy ORA, partial PVD  Clinical management:  See below  Abbreviations: NFP - Normal foveal profile. CME - cystoid macular edema. PED - pigment epithelial detachment. IRF - intraretinal fluid. SRF - subretinal fluid. EZ - ellipsoid zone. ERM - epiretinal membrane. ORA - outer retinal atrophy. ORT - outer retinal tubulation. SRHM - subretinal hyper-reflective material. IRHM -  intraretinal hyper-reflective material      Intravitreal Injection, Pharmacologic Agent - OD - Right Eye       Time Out 10/31/2023. 1:25 PM. Confirmed correct patient, procedure, site, and patient consented.   Anesthesia Topical anesthesia was used. Anesthetic medications included Lidocaine  2%, Proparacaine 0.5%.   Procedure Preparation included 5% betadine to ocular surface, eyelid speculum. A (32g) needle was used.   Injection: 2 mg aflibercept  2 MG/0.05ML   Route: Intravitreal, Site: Right Eye   NDC: Q956576, Lot: [id number], Expiration date: 12/27/2024, Waste: 0 mL   Post-op Post injection exam found visual acuity of at least counting fingers. The patient tolerated the procedure well. There were no complications. The patient received written and verbal post procedure care education.            ASSESSMENT/PLAN:   ICD-10-CM   1. Exudative age-related macular degeneration of right eye with active choroidal neovascularization (HCC)  H35.3211 OCT, Retina - OU - Both Eyes    Intravitreal Injection, Pharmacologic Agent - OD - Right Eye    aflibercept  (EYLEA ) SOLN 2 mg    2. Intermediate stage nonexudative age-related macular degeneration of left eye  H35.3122     3. Asteroid hyalosis of left eye  H43.22     4. Nevus of choroid of right eye  D31.31     5. Pseudophakia, both eyes  Z96.1       Exudative age related macular degeneration, OD - s/p IVA OD #1 (05.08.24), #2 (06.05.24), #3 (07.03.24), #4 (07.31.24), #5 (08.28.24) -- IVA resistance ======================== - s/p IVE OD #1 (09.26.24), #2 (10.30.24), #3 (12.04.24), #4 (01.22.25), #5 (03.26.25), #6 (06.05.25)  - BCVA 20/30 OD -- stable - OCT OD shows OD: Stable resolution of IRF/cystic changes temporal macula, stable resolution of shallow SRF overlying PEDs IT macula and fovea, Partial PVD at 13 wks - discussed extension vs prn therapy -- pt wishes to treat and extend  - recommend IVE OD #7 today, 09.03.25 w/  f/u extended to 16 wks - pt wishes to proceed with injection - RBA of procedure discussed, questions answered - IVE informed consent obtained and signed, 09.26.24 - see procedure note - Eylea  authorization approved for 2025 -- but no funding for [employer] -- pt is covering 20%  - f/u in 16 wks -- DFE/OCT, possible injection  2. Age related macular degeneration, non-exudative, left eye - The incidence, anatomy, and pathology of dry AMD, risk of progression, and the AREDS and AREDS 2 study including smoking risks discussed with patient.  - Recommend amsler grid monitoring - OCT OS: non-exu ARMD -- Scattered drusen with patchy ORA, partial PVD  - f/u 3 months, DFE, OCT  3. Asteroid hyalosis OS  4. Choroidal Nevus, OD  - pigmented lesion along proximal arcades  - no visual symptoms, SRF or orange pigment  - +drusen  - thickness < 2mm  - discussed findings, prognosis  - monitoring  5. Pseudophakia OU  - s/p CE/IOL OU (CEA, [place], [place])  - IOL in good position, doing well  - monitor  Ophthalmic Meds Ordered this visit:  Meds ordered this encounter  Medications   aflibercept  (EYLEA ) SOLN 2 mg     Return in about 16 weeks (around 02/20/2024) for f/u Ex. AMD OD , DFE, OCT, Possible, IVE, OD.  There are no Patient Instructions on file for this visit.  Explained the diagnoses, plan, and follow up with the patient and they expressed understanding.  Patient expressed understanding of the importance of proper follow up care.    This document serves as a record of services personally performed by Redell JUDITHANN Hans, MD, PhD. It was created on their behalf by Almetta Pesa, an ophthalmic technician. The creation of this record is the provider's dictation and/or activities during the visit.    Electronically signed by: Almetta Pesa, OA, 11/01/23  9:36 PM  This document serves as a record of services personally performed by Redell JUDITHANN Hans, MD, PhD. It was created on their behalf by  Wanda GEANNIE Keens, COT an ophthalmic technician. The creation of this record is the provider's dictation and/or activities during the visit.    Electronically signed by:  Wanda GEANNIE Keens, COT  11/01/23 9:36 PM  Redell JUDITHANN Hans, M.D., Ph.D. Diseases & Surgery of the Retina and Vitreous [facility] 10/31/2023  I have reviewed the above documentation for accuracy and completeness, and I agree with the above. Redell JUDITHANN Hans, M.D., Ph.D. 11/01/23 9:43 PM   Abbreviations: M myopia (nearsighted); A astigmatism; H hyperopia (farsighted); P presbyopia; Mrx spectacle prescription;  CTL contact lenses; OD right eye; OS left eye; OU both eyes  XT exotropia; ET esotropia; PEK punctate epithelial keratitis; PEE punctate epithelial erosions; DES dry eye syndrome; MGD meibomian gland dysfunction; ATs artificial tears; PFAT's preservative free artificial tears; NSC nuclear sclerotic cataract; PSC posterior subcapsular cataract; ERM epi-retinal membrane; PVD posterior vitreous detachment; RD retinal detachment; DM diabetes mellitus; DR diabetic retinopathy; NPDR non-proliferative diabetic retinopathy; PDR proliferative diabetic retinopathy; CSME clinically significant macular edema; DME diabetic macular edema; dbh dot blot hemorrhages; CWS cotton wool spot; POAG primary open angle glaucoma; C/D cup-to-disc ratio; HVF humphrey visual field; GVF goldmann visual field; OCT optical coherence tomography; IOP intraocular pressure; BRVO Branch retinal vein occlusion; CRVO central retinal vein occlusion; CRAO central retinal artery occlusion; BRAO branch retinal artery occlusion; RT retinal tear; SB scleral buckle; PPV pars plana vitrectomy; VH Vitreous hemorrhage; PRP panretinal laser photocoagulation; IVK intravitreal kenalog; VMT vitreomacular traction; MH Macular hole;  NVD neovascularization of the disc; NVE neovascularization elsewhere; AREDS age related eye disease study; ARMD age related  macular degeneration; POAG primary open angle glaucoma; EBMD epithelial/anterior basement membrane dystrophy; ACIOL anterior chamber intraocular lens; IOL intraocular lens; PCIOL posterior chamber intraocular lens; Phaco/IOL phacoemulsification with intraocular lens placement; PRK photorefractive keratectomy; LASIK laser assisted in situ keratomileusis; HTN hypertension; DM diabetes mellitus; COPD chronic obstructive pulmonary disease

## 2023-10-31 ENCOUNTER — Ambulatory Visit (INDEPENDENT_AMBULATORY_CARE_PROVIDER_SITE_OTHER): Admitting: Ophthalmology

## 2023-10-31 ENCOUNTER — Encounter (INDEPENDENT_AMBULATORY_CARE_PROVIDER_SITE_OTHER): Payer: Self-pay | Admitting: Ophthalmology

## 2023-10-31 DIAGNOSIS — H353211 Exudative age-related macular degeneration, right eye, with active choroidal neovascularization: Secondary | ICD-10-CM | POA: Diagnosis not present

## 2023-10-31 DIAGNOSIS — Z961 Presence of intraocular lens: Secondary | ICD-10-CM

## 2023-10-31 DIAGNOSIS — H4322 Crystalline deposits in vitreous body, left eye: Secondary | ICD-10-CM

## 2023-10-31 DIAGNOSIS — D3131 Benign neoplasm of right choroid: Secondary | ICD-10-CM | POA: Diagnosis not present

## 2023-10-31 DIAGNOSIS — H353122 Nonexudative age-related macular degeneration, left eye, intermediate dry stage: Secondary | ICD-10-CM | POA: Diagnosis not present

## 2023-10-31 MED ORDER — AFLIBERCEPT 2MG/0.05ML IZ SOLN FOR KALEIDOSCOPE
2.0000 mg | INTRAVITREAL | Status: AC | PRN
  Administered 2023-10-31: 2 mg via INTRAVITREAL

## 2024-02-13 ENCOUNTER — Telehealth: Payer: Self-pay

## 2024-02-13 LAB — COMPREHENSIVE METABOLIC PANEL WITH GFR
Albumin: 4.6 (ref 3.5–5.0)
Calcium: 10.1 (ref 8.7–10.7)
eGFR: 73

## 2024-02-13 LAB — HEPATIC FUNCTION PANEL
ALT: 17 U/L (ref 7–35)
AST: 16 (ref 13–35)
Alkaline Phosphatase: 65 (ref 25–125)
Bilirubin, Total: 0.7

## 2024-02-13 LAB — BASIC METABOLIC PANEL WITH GFR
BUN: 15 (ref 4–21)
CO2: 28 — AB (ref 13–22)
Chloride: 104 (ref 99–108)
Creatinine: 0.8 (ref 0.5–1.1)
Glucose: 67
Potassium: 3.9 meq/L (ref 3.5–5.1)
Sodium: 142 (ref 137–147)

## 2024-02-13 LAB — HEMOGLOBIN A1C: Hemoglobin A1C: 5.5

## 2024-02-13 LAB — CBC AND DIFFERENTIAL
HCT: 49 — AB (ref 36–46)
Hemoglobin: 16.5 — AB (ref 12.0–16.0)
Neutrophils Absolute: 5.3
Platelets: 192 K/uL (ref 150–400)
WBC: 7.9

## 2024-02-13 LAB — LIPID PANEL
Cholesterol: 169 (ref 0–200)
HDL: 45 (ref 35–70)
LDL Cholesterol: 93
Triglycerides: 212 — AB (ref 40–160)

## 2024-02-13 LAB — TSH: TSH: 1.38 (ref 0.41–5.90)

## 2024-02-13 LAB — CBC: RBC: 5.26 — AB (ref 3.87–5.11)

## 2024-02-13 NOTE — Telephone Encounter (Signed)
 Pt has called to report she is not able to take the Effexor  for the hot flashes. It is causing constipation. She saw her PCP today, Joyce,NP. She mentioned to pt that she could maybe ask you if raloxifene  would be an option instead? She uses [facility] in [place].

## 2024-02-14 ENCOUNTER — Telehealth: Payer: Self-pay | Admitting: Oncology

## 2024-02-14 DIAGNOSIS — D0511 Intraductal carcinoma in situ of right breast: Secondary | ICD-10-CM

## 2024-02-14 MED ORDER — VEOZAH 45 MG PO TABS: 45.0000 mg | ORAL_TABLET | Freq: Every day | ORAL | 5 refills | Status: DC

## 2024-02-14 NOTE — Telephone Encounter (Signed)
 Patient has been scheduled for follow-up visit per 02/14/2024 LOS.  LVM notifying pt of appt details, provided my direct number to pt if appt changes need to be made.

## 2024-02-15 NOTE — Telephone Encounter (Signed)
 Dr Cornelius reports she got test results yesterday.

## 2024-02-25 ENCOUNTER — Telehealth: Payer: Self-pay

## 2024-02-25 NOTE — Telephone Encounter (Signed)
 Pt LVM on nurse lin that her insurance company will not pay for Veozah . She is asking what other medication she could try?

## 2024-02-29 NOTE — Progress Notes (Signed)
 " [facility] - Clinic Note  03/05/2024   CHIEF COMPLAINT Patient presents for Retina Follow Up  HISTORY OF PRESENT ILLNESS: Molly Ferrell is a 74 y.o. female who presents to the clinic today for:  HPI     Retina Follow Up   Patient presents with  Wet AMD.  In right eye.  Severity is moderate.  Duration of 18 weeks.  Since onset it is stable.  I, the attending physician,  performed the HPI with the patient and updated documentation appropriately.        Comments   Pt here for 18 week ret f/u for exu ARMD OD. Pt states VA in OD is stable but feels VA in OS continues to decrease though its still about the same as when she was here previously. Seeing floaters OS. Pt uses Refresh gtts OU as often as she can.       Last edited by [name], MD on 03/05/2024  5:23 PM.     Patient feels the vision is stable.   Referring physician: Fleeta Zerita DASEN, MD [address] [place],  [place] [address]  HISTORICAL INFORMATION:  Selected notes from the MEDICAL RECORD NUMBER Referred by Dr. Fleeta for concern of exu ARMD OD LEE:  Ocular Hx- PMH-   CURRENT MEDICATIONS: No current outpatient medications on file. (Ophthalmic Drugs)   No current facility-administered medications for this visit. (Ophthalmic Drugs)   Current Outpatient Medications (Other)  Medication Sig   albuterol  (VENTOLIN  HFA) 108 (90 Base) MCG/ACT inhaler Inhale 2 puffs into the lungs every 4 (four) hours as needed for wheezing or shortness of breath.   Ascorbic Acid (VITAMIN C) 1000 MG tablet Take 1,000 mg by mouth daily.   aspirin  EC 81 MG tablet Take 81 mg by mouth 2 (two) times daily. Swallow whole.   atorvastatin  (LIPITOR) 20 MG tablet Take 20 mg by mouth daily.   celecoxib  (CELEBREX ) 200 MG capsule Take 200 mg by mouth daily.   cholecalciferol  (VITAMIN D3) 25 MCG (1000 UNIT) tablet Take 1,000 Units by mouth in the morning and at bedtime.   dicyclomine  (BENTYL ) 10 MG capsule Take 10 mg by  mouth every 6 (six) hours as needed for spasms.   docusate sodium  (COLACE) 100 MG capsule Take 1 capsule (100 mg total) by mouth 2 (two) times daily.   fish oil-omega-3 fatty acids 1000 MG capsule Take 1 g by mouth daily.   Lutein  20 MG TABS Take 20 mg by mouth 2 (two) times daily.   MAGNESIUM GLYCINATE PO Take 350 mg by mouth at bedtime.   Melatonin 5 MG TABS Take 5 mg by mouth at bedtime.   niacin  500 MG tablet Take 500 mg by mouth 2 (two) times daily.    omeprazole (PRILOSEC) 40 MG capsule Take 40 mg by mouth daily.   ondansetron  (ZOFRAN -ODT) 4 MG disintegrating tablet Take 1 tablet (4 mg total) by mouth every 8 (eight) hours as needed for nausea or vomiting.   promethazine -dextromethorphan (PROMETHAZINE -DM) 6.25-15 MG/5ML syrup Take 5 mLs by mouth 4 (four) times daily as needed for cough.   Spacer/Aero-Holding Chambers (COMPACT SPACE CHAMBER) DEVI Use with the albuterol  inhaler   Testosterone  POWD Apply 1 Dose topically daily. Testosterone  Cream 2% - APPLY 0.5GM (2 CLICKS) TO LABIA ONCE A DAY.   thyroid  (ARMOUR) 60 MG tablet Take 60 mg by mouth daily before breakfast.   vitamin E 400 UNIT capsule Take 400 Units by mouth daily.   cloNIDine  (  CATAPRES ) 0.1 MG tablet Take 1 tablet (0.1 mg total) by mouth daily.   Fezolinetant  (VEOZAH ) 45 MG TABS Take 1 tablet (45 mg total) by mouth daily.   No current facility-administered medications for this visit. (Other)   REVIEW OF SYSTEMS: ROS   Positive for: Endocrine, Cardiovascular, Eyes Negative for: Constitutional, Gastrointestinal, Neurological, Skin, Musculoskeletal, HENT, Respiratory, Psychiatric, Allergic/Imm, Heme/Lymph Last edited by Antonetta Almetta BRAVO, COT on 03/05/2024  1:04 PM.      ALLERGIES Allergies  Allergen Reactions   Penicillins Hives and Itching    CHILDHOOD reaction to Pen G  Has patient had a PCN reaction causing immediate rash, facial/tongue/throat swelling, SOB or lightheadedness with hypotension: No  Has patient had  a PCN reaction causing severe rash involving mucus membranes or skin necrosis: No  Has patient had a PCN reaction that required hospitalization: No  Has patient had a PCN reaction occurring within the last 10 years: No  If all of the above answers are NO, then may proceed with Cephalosporin use.  CHILDHOOD reaction to Pen G, Has patient had a PCN reaction causing immediate rash, facial/tongue/throat swelling, SOB or lightheadedness with hypotension: No, Has patient had a PCN reaction causing severe rash involving mucus membranes or skin necrosis: No, Has patient had a PCN reaction that required hospitalization: No, Has patient had a PCN reaction occurring within the last 10 years: No, If all of the above answers are NO, then may proceed with Cephalosporin use.   PAST MEDICAL HISTORY Past Medical History:  Diagnosis Date   Arthritis    Cancer (HCC) 07/2021   Right Breast Cancer-Stage 0; 1 month of radation per pt   Complication of anesthesia    many years ago, had problems waking up; patient stated no problems with recent surgeries 01/21/2018   GERD (gastroesophageal reflux disease)    Hard of hearing    History of bronchitis    Hypercholesteremia    Hypertriglyceridemia    Hypothyroidism    Macular degeneration of both eyes    OA (osteoarthritis)    severe right knee   Spondylolisthesis of lumbar region    Wears glasses    Past Surgical History:  Procedure Laterality Date   ABDOMINAL HYSTERECTOMY     APPENDECTOMY     BACK SURGERY     lumbar   BREAST LUMPECTOMY Right 07/2021   CATARACT EXTRACTION W/ INTRAOCULAR LENS IMPLANT Bilateral    COLONOSCOPY     ESOPHAGOGASTRODUODENOSCOPY     JOINT REPLACEMENT     bil hip replacements   KNEE ARTHROPLASTY Left    TONSILLECTOMY     TOTAL KNEE ARTHROPLASTY Right 06/12/2016   Procedure: TOTAL KNEE ARTHROPLASTY;  Surgeon: Marcey Raman, MD;  Location: [facility] OR;  Service: Orthopedics;  Laterality: Right;   TOTAL KNEE REVISION Left  06/14/2015   Procedure: TOTAL KNEE REVISION POLY EXCHANGE;  Surgeon: Marcey Raman, MD;  Location: [facility] OR;  Service: Orthopedics;  Laterality: Left;   FAMILY HISTORY Family History  Problem Relation Age of Onset   Heart disease Mother    Heart disease Father    SOCIAL HISTORY Social History   Tobacco Use   Smoking status: Former   Smokeless tobacco: Never   Tobacco comments:    quit 10-15 years ago 06/03/15  Vaping Use   Vaping status: Never Used  Substance Use Topics   Alcohol  use: Yes    Alcohol /week: 2.0 standard drinks of alcohol     Types: 2 Glasses of wine per week  Comment: weekends   Drug use: No       OPHTHALMIC EXAM:  Base Eye Exam     Visual Acuity (Snellen - Linear)       Right Left   Dist [place] 20/30 +1 CF at 3'   Dist ph [place] NI NI         Tonometry (Tonopen, 1:09 PM)       Right Left   Pressure 12 14         Pupils       Pupils Dark Light Shape React APD   Right PERRL 3 2 Round Brisk None   Left PERRL 3 2 Round Brisk None         Visual Fields (Counting fingers)       Left Right    Full Full         Extraocular Movement       Right Left    Full, Ortho Full, Ortho         Neuro/Psych     Oriented x3: Yes   Mood/Affect: Normal         Dilation     Both eyes: 1.0% Mydriacyl, 2.5% Phenylephrine  @ 1:10 PM           Slit Lamp and Fundus Exam     Slit Lamp Exam       Right Left   Lids/Lashes Dermatochalasis - upper lid Dermatochalasis - upper lid   Conjunctiva/Sclera White and quiet White and quiet   Cornea mild arcus, well healed cataract wound mild arcus, well healed cataract wound   Anterior Chamber deep and clear deep and clear   Iris Round and dilated Poorly dilated   Lens PC IOL in good position PC IOL in good position   Anterior Vitreous Vitreous syneresis Vitreous syneresis, + asteroid hyalosis         Fundus Exam       Right Left   Disc Pink and Sharp, +PPP Pink and Sharp, mild PPA   C/D Ratio 0.4  0.2   Macula Flat, Blunted foveal reflex, refractile drusen, +CNV with shallow SRF IT macula -- stably resolved, cystic changes -- stably resolved, no frank heme, pigmented lesion along proximal ST arcades (approx 2DD) Flat, Blunted foveal reflex, refractile drusen, RPE mottling, clumping and atrophy, No heme or edema   Vessels attenuated, Tortuous attenuated, Tortuous   Periphery Attached, No heme Attached, No heme           IMAGING AND PROCEDURES  Imaging and Procedures for 03/05/2024  OCT, Retina - OU - Both Eyes       Right Eye Quality was good. Central Foveal Thickness: 220. Progression has been stable. Findings include normal foveal contour, no IRF, no SRF, retinal drusen , subretinal hyper-reflective material, pigment epithelial detachment (Stable resolution of IRF/cystic changes temporal macula, stable resolution of shallow SRF overlying PEDs IT macula and fovea, Partial PVD).   Left Eye Quality was good. Central Foveal Thickness: 212. Progression has been stable. Findings include normal foveal contour, no IRF, no SRF, retinal drusen , subretinal hyper-reflective material, pigment epithelial detachment, outer retinal atrophy (Scattered drusen with patchy ORA with central confluence, partial PVD).   Notes *Images captured and stored on drive  Diagnosis / Impression:  OD: exu ARMD -- Stable resolution of IRF/cystic changes temporal macula, stable resolution of shallow SRF overlying PEDs IT macula and fovea, Partial PVD OS: non-exu ARMD -- Scattered drusen with patchy ORA with central confluence, partial PVD  Clinical management:  See below  Abbreviations: NFP - Normal foveal profile. CME - cystoid macular edema. PED - pigment epithelial detachment. IRF - intraretinal fluid. SRF - subretinal fluid. EZ - ellipsoid zone. ERM - epiretinal membrane. ORA - outer retinal atrophy. ORT - outer retinal tubulation. SRHM - subretinal hyper-reflective material. IRHM - intraretinal  hyper-reflective material      Intravitreal Injection, Pharmacologic Agent - OD - Right Eye       Time Out 03/05/2024. 1:42 PM. Confirmed correct patient, procedure, site, and patient consented.   Anesthesia Topical anesthesia was used. Anesthetic medications included Lidocaine  2%, Proparacaine 0.5%.   Procedure Preparation included 5% betadine to ocular surface, eyelid speculum. A (32g) needle was used.   Injection: 2 mg aflibercept  2 MG/0.05ML   Route: Intravitreal, Site: Right Eye   NDC: [id number], Lot: [id number], Expiration date: 03/29/2025, Waste: 0 mL   Post-op Post injection exam found visual acuity of at least counting fingers. The patient tolerated the procedure well. There were no complications. The patient received written and verbal post procedure care education.           ASSESSMENT/PLAN:   ICD-10-CM   1. Exudative age-related macular degeneration of right eye with active choroidal neovascularization (HCC)  H35.3211 OCT, Retina - OU - Both Eyes    Intravitreal Injection, Pharmacologic Agent - OD - Right Eye    aflibercept  (EYLEA ) SOLN 2 mg    CANCELED: Intravitreal Injection, Pharmacologic Agent - OS - Left Eye    2. Intermediate stage nonexudative age-related macular degeneration of left eye  H35.3122     3. Asteroid hyalosis of left eye  H43.22     4. Nevus of choroid of right eye  D31.31     5. Pseudophakia, both eyes  Z96.1      Exudative age related macular degeneration, OD - s/p IVA OD #1 (05.08.24), #2 (06.05.24), #3 (07.03.24), #4 (07.31.24), #5 (08.28.24)  -- IVA resistance ======================== - s/p IVE OD #1 (09.26.24), #2 (10.30.24), #3 (12.04.24), #4 (01.22.25), #5 (03.26.25), #6 (06.05.25), #7 (09.03.25)  - BCVA 20/30 OD -- stable - OCT OD shows OD: Stable resolution of IRF/cystic changes temporal macula, stable resolution of shallow SRF overlying PEDs IT macula and fovea, Partial PVD at 18 wks - discussed maintenance vs prn therapy  -- pt wishes to continue maintenance therapy  - recommend IVE OD #8 today, 01.07.26 w/ f/u in 18 wks - pt wishes to proceed with injection - RBA of procedure discussed, questions answered - IVE informed consent obtained and signed, 01.07.26 - see procedure note - Eylea  authorization approved for 2025 -- but no funding for [employer] -- pt is covering 20%  - f/u in 18 wks -- DFE/OCT, possible injection  2. Age related macular degeneration, non-exudative, left eye - The incidence, anatomy, and pathology of dry AMD, risk of progression, and the AREDS and AREDS 2 study including smoking risks discussed with patient.  - Recommend amsler grid monitoring - OCT OS: non-exu ARMD -- Scattered drusen with patchy ORA, partial PVD  - f/u 3 months, DFE, OCT  3. Asteroid hyalosis OS  4. Choroidal Nevus, OD  - pigmented lesion along proximal arcades  - no visual symptoms, SRF or orange pigment  - +drusen  - thickness < 2mm  - discussed findings, prognosis  - monitoring  5. Pseudophakia OU  - s/p CE/IOL OU (CEA, [place], [place])  - IOL in good position, doing well  - monitor  Ophthalmic Meds Ordered this visit:  Meds ordered this encounter  Medications   aflibercept  (EYLEA ) SOLN 2 mg     Return in about 18 weeks (around 07/09/2024) for f/u, Ex. AMD, DFE, OCT, Possible, IVE, OD.  There are no Patient Instructions on file for this visit.  Explained the diagnoses, plan, and follow up with the patient and they expressed understanding.  Patient expressed understanding of the importance of proper follow up care.    This document serves as a record of services personally performed by Redell JUDITHANN Hans, MD, PhD. It was created on their behalf by Almetta Pesa, an ophthalmic technician. The creation of this record is the provider's dictation and/or activities during the visit.    Electronically signed by: Almetta Pesa, OA, 03/16/24  1:19 PM  This document serves as a record of services  personally performed by Redell JUDITHANN Hans, MD, PhD. It was created on their behalf by Wanda GEANNIE Keens, COT an ophthalmic technician. The creation of this record is the provider's dictation and/or activities during the visit.    Electronically signed by:  Wanda GEANNIE Keens, COT  03/16/24 1:19 PM  Redell JUDITHANN Hans, M.D., Ph.D. Diseases & Surgery of the Retina and Vitreous [facility] 03/05/2024  I have reviewed the above documentation for accuracy and completeness, and I agree with the above. Redell JUDITHANN Hans, M.D., Ph.D. 03/16/24 1:20 PM   Abbreviations: M myopia (nearsighted); A astigmatism; H hyperopia (farsighted); P presbyopia; Mrx spectacle prescription;  CTL contact lenses; OD right eye; OS left eye; OU both eyes  XT exotropia; ET esotropia; PEK punctate epithelial keratitis; PEE punctate epithelial erosions; DES dry eye syndrome; MGD meibomian gland dysfunction; ATs artificial tears; PFAT's preservative free artificial tears; NSC nuclear sclerotic cataract; PSC posterior subcapsular cataract; ERM epi-retinal membrane; PVD posterior vitreous detachment; RD retinal detachment; DM diabetes mellitus; DR diabetic retinopathy; NPDR non-proliferative diabetic retinopathy; PDR proliferative diabetic retinopathy; CSME clinically significant macular edema; DME diabetic macular edema; dbh dot blot hemorrhages; CWS cotton wool spot; POAG primary open angle glaucoma; C/D cup-to-disc ratio; HVF humphrey visual field; GVF goldmann visual field; OCT optical coherence tomography; IOP intraocular pressure; BRVO Branch retinal vein occlusion; CRVO central retinal vein occlusion; CRAO central retinal artery occlusion; BRAO branch retinal artery occlusion; RT retinal tear; SB scleral buckle; PPV pars plana vitrectomy; VH Vitreous hemorrhage; PRP panretinal laser photocoagulation; IVK intravitreal kenalog; VMT vitreomacular traction; MH Macular hole;  NVD neovascularization of the disc; NVE  neovascularization elsewhere; AREDS age related eye disease study; ARMD age related macular degeneration; POAG primary open angle glaucoma; EBMD epithelial/anterior basement membrane dystrophy; ACIOL anterior chamber intraocular lens; IOL intraocular lens; PCIOL posterior chamber intraocular lens; Phaco/IOL phacoemulsification with intraocular lens placement; PRK photorefractive keratectomy; LASIK laser assisted in situ keratomileusis; HTN hypertension; DM diabetes mellitus; COPD chronic obstructive pulmonary disease "

## 2024-03-05 ENCOUNTER — Encounter (INDEPENDENT_AMBULATORY_CARE_PROVIDER_SITE_OTHER): Payer: Self-pay | Admitting: Ophthalmology

## 2024-03-05 ENCOUNTER — Ambulatory Visit (INDEPENDENT_AMBULATORY_CARE_PROVIDER_SITE_OTHER): Admitting: Ophthalmology

## 2024-03-05 DIAGNOSIS — H4322 Crystalline deposits in vitreous body, left eye: Secondary | ICD-10-CM | POA: Diagnosis not present

## 2024-03-05 DIAGNOSIS — Z961 Presence of intraocular lens: Secondary | ICD-10-CM | POA: Diagnosis not present

## 2024-03-05 DIAGNOSIS — H353122 Nonexudative age-related macular degeneration, left eye, intermediate dry stage: Secondary | ICD-10-CM

## 2024-03-05 DIAGNOSIS — D3131 Benign neoplasm of right choroid: Secondary | ICD-10-CM

## 2024-03-05 DIAGNOSIS — H353211 Exudative age-related macular degeneration, right eye, with active choroidal neovascularization: Secondary | ICD-10-CM | POA: Diagnosis not present

## 2024-03-05 MED ORDER — AFLIBERCEPT 2MG/0.05ML IZ SOLN FOR KALEIDOSCOPE
2.0000 mg | INTRAVITREAL | Status: AC | PRN
  Administered 2024-03-05: 2 mg via INTRAVITREAL

## 2024-03-11 ENCOUNTER — Other Ambulatory Visit: Payer: Self-pay | Admitting: Oncology

## 2024-03-11 DIAGNOSIS — N951 Menopausal and female climacteric states: Secondary | ICD-10-CM

## 2024-03-11 MED ORDER — VEOZAH 45 MG PO TABS: 45.0000 mg | ORAL_TABLET | Freq: Every day | ORAL | 5 refills | Status: AC

## 2024-03-13 ENCOUNTER — Telehealth: Payer: Self-pay

## 2024-03-13 ENCOUNTER — Other Ambulatory Visit: Payer: Self-pay | Admitting: Oncology

## 2024-03-13 DIAGNOSIS — N951 Menopausal and female climacteric states: Secondary | ICD-10-CM

## 2024-03-13 MED ORDER — CLONIDINE HCL 0.1 MG PO TABS: 0.1000 mg | ORAL_TABLET | Freq: Every day | ORAL | 11 refills | Status: AC

## 2024-03-13 NOTE — Telephone Encounter (Signed)
 Patient not able to get Veozah  medication ,price with medicare is $300.00.  She is not able to use the co pay card offered online with medicare insurance,Recommendations?

## 2024-05-14 ENCOUNTER — Inpatient Hospital Stay

## 2024-05-14 ENCOUNTER — Inpatient Hospital Stay: Admitting: Oncology

## 2024-07-09 ENCOUNTER — Encounter (INDEPENDENT_AMBULATORY_CARE_PROVIDER_SITE_OTHER): Admitting: Ophthalmology

## 2024-09-17 ENCOUNTER — Ambulatory Visit: Admitting: Oncology
